# Patient Record
Sex: Female | Born: 1960 | Race: White | Hispanic: No | Marital: Single | Smoking: Current every day smoker
Health system: Southern US, Community
[De-identification: ages and names within clinical notes are randomized; demographics above are authoritative.]

## PROBLEM LIST (undated history)

## (undated) DIAGNOSIS — F410 Panic disorder [episodic paroxysmal anxiety] without agoraphobia: Secondary | ICD-10-CM

## (undated) DIAGNOSIS — F319 Bipolar disorder, unspecified: Secondary | ICD-10-CM

## (undated) DIAGNOSIS — G8929 Other chronic pain: Secondary | ICD-10-CM

## (undated) DIAGNOSIS — F329 Major depressive disorder, single episode, unspecified: Secondary | ICD-10-CM

## (undated) DIAGNOSIS — R569 Unspecified convulsions: Secondary | ICD-10-CM

## (undated) DIAGNOSIS — M5430 Sciatica, unspecified side: Secondary | ICD-10-CM

## (undated) DIAGNOSIS — M549 Dorsalgia, unspecified: Secondary | ICD-10-CM

## (undated) DIAGNOSIS — M1990 Unspecified osteoarthritis, unspecified site: Secondary | ICD-10-CM

## (undated) DIAGNOSIS — F32A Depression, unspecified: Secondary | ICD-10-CM

## (undated) HISTORY — PX: ABDOMINAL SURGERY: SHX537

## (undated) HISTORY — PX: EYE SURGERY: SHX253

---

## 2004-03-27 ENCOUNTER — Inpatient Hospital Stay (HOSPITAL_COMMUNITY): Admission: RE | Admit: 2004-03-27 | Discharge: 2004-04-01 | Payer: Self-pay | Admitting: Psychiatry

## 2007-03-13 ENCOUNTER — Emergency Department: Payer: Self-pay | Admitting: Emergency Medicine

## 2007-07-13 ENCOUNTER — Emergency Department: Payer: Self-pay | Admitting: Emergency Medicine

## 2008-07-31 ENCOUNTER — Emergency Department: Payer: Self-pay | Admitting: Unknown Physician Specialty

## 2008-10-21 ENCOUNTER — Emergency Department: Payer: Self-pay | Admitting: Emergency Medicine

## 2009-02-04 ENCOUNTER — Emergency Department: Payer: Self-pay | Admitting: Emergency Medicine

## 2009-03-27 ENCOUNTER — Emergency Department: Payer: Self-pay | Admitting: Internal Medicine

## 2009-05-29 ENCOUNTER — Emergency Department: Payer: Self-pay | Admitting: Emergency Medicine

## 2009-11-20 ENCOUNTER — Emergency Department: Payer: Self-pay | Admitting: Internal Medicine

## 2010-02-12 ENCOUNTER — Emergency Department: Payer: Self-pay | Admitting: Emergency Medicine

## 2010-06-27 ENCOUNTER — Emergency Department: Payer: Self-pay | Admitting: Emergency Medicine

## 2010-06-30 ENCOUNTER — Emergency Department: Payer: Self-pay | Admitting: Emergency Medicine

## 2011-02-13 ENCOUNTER — Emergency Department: Payer: Self-pay | Admitting: Emergency Medicine

## 2011-06-12 ENCOUNTER — Emergency Department: Payer: Self-pay | Admitting: Emergency Medicine

## 2012-04-20 ENCOUNTER — Emergency Department: Payer: Self-pay

## 2012-06-14 ENCOUNTER — Emergency Department: Payer: Self-pay | Admitting: Emergency Medicine

## 2012-09-14 ENCOUNTER — Emergency Department: Payer: Self-pay | Admitting: Emergency Medicine

## 2012-09-17 ENCOUNTER — Emergency Department: Payer: Self-pay | Admitting: Emergency Medicine

## 2012-11-04 ENCOUNTER — Emergency Department: Payer: Self-pay | Admitting: Emergency Medicine

## 2013-04-04 ENCOUNTER — Emergency Department: Payer: Self-pay | Admitting: Unknown Physician Specialty

## 2013-08-28 ENCOUNTER — Emergency Department: Payer: Self-pay | Admitting: Emergency Medicine

## 2013-09-04 ENCOUNTER — Inpatient Hospital Stay: Payer: Self-pay | Admitting: Psychiatry

## 2013-09-04 LAB — CBC
HCT: 39.8 % (ref 35.0–47.0)
HGB: 13.3 g/dL (ref 12.0–16.0)
MCH: 31.1 pg (ref 26.0–34.0)
MCHC: 33.4 g/dL (ref 32.0–36.0)
MCV: 93 fL (ref 80–100)
PLATELETS: 393 10*3/uL (ref 150–440)
RBC: 4.28 10*6/uL (ref 3.80–5.20)
RDW: 13.8 % (ref 11.5–14.5)
WBC: 6.2 10*3/uL (ref 3.6–11.0)

## 2013-09-04 LAB — DRUG SCREEN, URINE
AMPHETAMINES, UR SCREEN: NEGATIVE (ref ?–1000)
Barbiturates, Ur Screen: NEGATIVE (ref ?–200)
Benzodiazepine, Ur Scrn: POSITIVE (ref ?–200)
Cannabinoid 50 Ng, Ur ~~LOC~~: POSITIVE (ref ?–50)
Cocaine Metabolite,Ur ~~LOC~~: POSITIVE (ref ?–300)
MDMA (Ecstasy)Ur Screen: NEGATIVE (ref ?–500)
Methadone, Ur Screen: POSITIVE (ref ?–300)
Opiate, Ur Screen: NEGATIVE (ref ?–300)
PHENCYCLIDINE (PCP) UR S: NEGATIVE (ref ?–25)
TRICYCLIC, UR SCREEN: POSITIVE (ref ?–1000)

## 2013-09-04 LAB — COMPREHENSIVE METABOLIC PANEL
ALK PHOS: 86 U/L
ANION GAP: 0 — AB (ref 7–16)
Albumin: 3.9 g/dL (ref 3.4–5.0)
BILIRUBIN TOTAL: 0.2 mg/dL (ref 0.2–1.0)
BUN: 11 mg/dL (ref 7–18)
CALCIUM: 8.7 mg/dL (ref 8.5–10.1)
CREATININE: 1.41 mg/dL — AB (ref 0.60–1.30)
Chloride: 101 mmol/L (ref 98–107)
Co2: 33 mmol/L — ABNORMAL HIGH (ref 21–32)
EGFR (Non-African Amer.): 43 — ABNORMAL LOW
GFR CALC AF AMER: 50 — AB
GLUCOSE: 76 mg/dL (ref 65–99)
OSMOLALITY: 266 (ref 275–301)
Potassium: 3.7 mmol/L (ref 3.5–5.1)
SGOT(AST): 22 U/L (ref 15–37)
SGPT (ALT): 13 U/L (ref 12–78)
SODIUM: 134 mmol/L — AB (ref 136–145)
Total Protein: 7.7 g/dL (ref 6.4–8.2)

## 2013-09-04 LAB — ACETAMINOPHEN LEVEL: Acetaminophen: <2 ug/mL

## 2013-09-04 LAB — ETHANOL
Ethanol %: <0.003 % (ref 0.000–0.080)
Ethanol: <3 mg/dL

## 2013-09-04 LAB — URINALYSIS, COMPLETE
BLOOD: NEGATIVE
Bacteria: NONE SEEN
Bilirubin,UR: NEGATIVE
Glucose,UR: NEGATIVE mg/dL (ref 0–75)
Ketone: NEGATIVE
Nitrite: NEGATIVE
PH: 6 (ref 4.5–8.0)
Protein: NEGATIVE
RBC,UR: NONE SEEN /HPF (ref 0–5)
SPECIFIC GRAVITY: 1.005 (ref 1.003–1.030)

## 2013-09-04 LAB — SALICYLATE LEVEL: Salicylates, Serum: 4.2 mg/dL — ABNORMAL HIGH

## 2014-02-28 ENCOUNTER — Emergency Department: Payer: Self-pay | Admitting: Emergency Medicine

## 2014-02-28 LAB — URINALYSIS, COMPLETE
BACTERIA: NONE SEEN
Bilirubin,UR: NEGATIVE
GLUCOSE, UR: NEGATIVE mg/dL (ref 0–75)
KETONE: NEGATIVE
Leukocyte Esterase: NEGATIVE
Nitrite: NEGATIVE
PROTEIN: NEGATIVE
Ph: 6 (ref 4.5–8.0)
RBC,UR: 2 /HPF (ref 0–5)
Specific Gravity: 1.02 (ref 1.003–1.030)
Squamous Epithelial: <1

## 2014-02-28 LAB — COMPREHENSIVE METABOLIC PANEL
ALBUMIN: 4.2 g/dL (ref 3.4–5.0)
ALK PHOS: 90 U/L
ALT: 11 U/L — AB (ref 12–78)
Anion Gap: 5 — ABNORMAL LOW (ref 7–16)
BUN: 7 mg/dL (ref 7–18)
Bilirubin,Total: 0.2 mg/dL (ref 0.2–1.0)
CREATININE: 0.99 mg/dL (ref 0.60–1.30)
Calcium, Total: 9.7 mg/dL (ref 8.5–10.1)
Chloride: 106 mmol/L (ref 98–107)
Co2: 27 mmol/L (ref 21–32)
EGFR (African American): >60
EGFR (Non-African Amer.): >60
Glucose: 85 mg/dL (ref 65–99)
Osmolality: 273 (ref 275–301)
Potassium: 3.7 mmol/L (ref 3.5–5.1)
SGOT(AST): 20 U/L (ref 15–37)
Sodium: 138 mmol/L (ref 136–145)
Total Protein: 8.3 g/dL — ABNORMAL HIGH (ref 6.4–8.2)

## 2014-02-28 LAB — DRUG SCREEN, URINE

## 2014-02-28 LAB — CBC
HCT: 46.2 % (ref 35.0–47.0)
HGB: 15.3 g/dL (ref 12.0–16.0)
MCH: 30.2 pg (ref 26.0–34.0)
MCHC: 33.1 g/dL (ref 32.0–36.0)
MCV: 91 fL (ref 80–100)
PLATELETS: 405 10*3/uL (ref 150–440)
RBC: 5.07 10*6/uL (ref 3.80–5.20)
RDW: 14.3 % (ref 11.5–14.5)
WBC: 8.6 10*3/uL (ref 3.6–11.0)

## 2014-02-28 LAB — ETHANOL: Ethanol %: <0.003 % (ref 0.000–0.080)

## 2014-02-28 LAB — ACETAMINOPHEN LEVEL: Acetaminophen: <2 ug/mL

## 2014-02-28 LAB — SALICYLATE LEVEL: Salicylates, Serum: 6.4 mg/dL — ABNORMAL HIGH

## 2014-03-12 ENCOUNTER — Emergency Department: Payer: Self-pay | Admitting: Emergency Medicine

## 2014-03-12 LAB — COMPREHENSIVE METABOLIC PANEL
ALK PHOS: 84 U/L
ANION GAP: 7 (ref 7–16)
Albumin: 4 g/dL (ref 3.4–5.0)
BILIRUBIN TOTAL: 0.3 mg/dL (ref 0.2–1.0)
BUN: 9 mg/dL (ref 7–18)
CREATININE: 1.23 mg/dL (ref 0.60–1.30)
Calcium, Total: 8.9 mg/dL (ref 8.5–10.1)
Chloride: 104 mmol/L (ref 98–107)
Co2: 26 mmol/L (ref 21–32)
EGFR (African American): 58 — ABNORMAL LOW
EGFR (Non-African Amer.): 50 — ABNORMAL LOW
GLUCOSE: 69 mg/dL (ref 65–99)
Osmolality: 271 (ref 275–301)
POTASSIUM: 3.8 mmol/L (ref 3.5–5.1)
SGOT(AST): 27 U/L (ref 15–37)
SGPT (ALT): 13 U/L (ref 12–78)
Sodium: 137 mmol/L (ref 136–145)
Total Protein: 7.6 g/dL (ref 6.4–8.2)

## 2014-03-12 LAB — SALICYLATE LEVEL: Salicylates, Serum: 3.8 mg/dL — ABNORMAL HIGH

## 2014-03-12 LAB — URINALYSIS, COMPLETE
BLOOD: NEGATIVE
Bilirubin,UR: NEGATIVE
Glucose,UR: NEGATIVE mg/dL (ref 0–75)
Ketone: NEGATIVE
Leukocyte Esterase: NEGATIVE
Nitrite: NEGATIVE
PH: 6 (ref 4.5–8.0)
Protein: NEGATIVE
RBC,UR: 2 /HPF (ref 0–5)
Specific Gravity: 1.013 (ref 1.003–1.030)
Squamous Epithelial: NONE SEEN
WBC UR: 1 /HPF (ref 0–5)

## 2014-03-12 LAB — CBC
HCT: 42.6 % (ref 35.0–47.0)
HGB: 14.2 g/dL (ref 12.0–16.0)
MCH: 30.2 pg (ref 26.0–34.0)
MCHC: 33.2 g/dL (ref 32.0–36.0)
MCV: 91 fL (ref 80–100)
PLATELETS: 337 10*3/uL (ref 150–440)
RBC: 4.69 10*6/uL (ref 3.80–5.20)
RDW: 14.3 % (ref 11.5–14.5)
WBC: 13.1 10*3/uL — ABNORMAL HIGH (ref 3.6–11.0)

## 2014-03-12 LAB — DRUG SCREEN, URINE
AMPHETAMINES, UR SCREEN: NEGATIVE (ref ?–1000)
BENZODIAZEPINE, UR SCRN: POSITIVE (ref ?–200)
Barbiturates, Ur Screen: NEGATIVE (ref ?–200)
Cannabinoid 50 Ng, Ur ~~LOC~~: POSITIVE (ref ?–50)
Cocaine Metabolite,Ur ~~LOC~~: POSITIVE (ref ?–300)
MDMA (Ecstasy)Ur Screen: NEGATIVE (ref ?–500)
Methadone, Ur Screen: NEGATIVE (ref ?–300)
Opiate, Ur Screen: POSITIVE (ref ?–300)
Phencyclidine (PCP) Ur S: NEGATIVE (ref ?–25)
Tricyclic, Ur Screen: NEGATIVE (ref ?–1000)

## 2014-03-12 LAB — ETHANOL

## 2014-03-12 LAB — ACETAMINOPHEN LEVEL: Acetaminophen: <2 ug/mL

## 2014-07-19 ENCOUNTER — Emergency Department: Payer: Self-pay | Admitting: Emergency Medicine

## 2014-09-17 ENCOUNTER — Emergency Department: Payer: Self-pay | Admitting: Emergency Medicine

## 2014-10-19 ENCOUNTER — Emergency Department: Payer: Self-pay | Admitting: Emergency Medicine

## 2014-10-20 ENCOUNTER — Emergency Department: Payer: Self-pay | Admitting: Emergency Medicine

## 2014-11-15 ENCOUNTER — Ambulatory Visit: Payer: Self-pay

## 2014-11-17 ENCOUNTER — Ambulatory Visit: Payer: Self-pay

## 2014-11-20 ENCOUNTER — Emergency Department: Payer: Self-pay | Admitting: Emergency Medicine

## 2014-11-21 ENCOUNTER — Emergency Department: Payer: Self-pay | Admitting: Emergency Medicine

## 2014-12-08 ENCOUNTER — Other Ambulatory Visit: Payer: Self-pay | Admitting: Oncology

## 2014-12-08 DIAGNOSIS — N6320 Unspecified lump in the left breast, unspecified quadrant: Secondary | ICD-10-CM

## 2014-12-16 NOTE — H&P (Signed)
PATIENT NAME:  Nicole Chandler, Nicole Chandler MR#:  [id number] DATE OF BIRTH:  [birth year]  DATE OF ADMISSION:  09/04/2013  DATE OF CONSULTATION: 09/05/2013   IDENTIFYING INFORMATION AND CHIEF COMPLAINT: A 54 year old woman who came to the hospital voluntarily with the chief complaint, "I need to get off these pills."   HISTORY OF PRESENT ILLNESS: Information obtained from the patient and the chart. The patient came to the Emergency Room saying that she needs to stop abusing pills. She tells me that she has been using pills "all my life."   Recently, on about Christmas Day, her long-term boyfriend died. Since that time, her life has been shattered. She no longer has a place to live and has lost most of her property as she was very dependent on him. She is using methadone, by her report about 20 mg a day, and also says she uses Percocet regularly. Also had used some Xanax just prior to admission. Denies that she drinks frequently but admits that she uses cocaine and has been using more frequently the last few days.   Mood has been down and depressed. She has been feeling sad and tearful. Poor sleep. Poor appetite. She had passive suicidal thoughts without any active intent.   PAST PSYCHIATRIC HISTORY: The patient has minimal past history with treatment. Says that she has never been in any kind of serious substance abuse rehab before. Denies any past history of suicide attempts. Denies any history of psychiatric medication.   SOCIAL HISTORY: Not working and has not worked in years. She was dependent on her long-term boyfriend who passed away just a couple of weeks ago. Currently she is staying with her sisters. Her sisters are her closest relatives. She does have adult children and stays in occasional contact with them.   PAST MEDICAL HISTORY: Reports that she has a heart murmur and also has what she calls "seizures." When she describes them, they do not sound like seizures but rather perhaps like dissociative  episodes.   CURRENT MEDICATIONS: Flexeril 10 mg p.r.n.   ALLERGIES: No known drug allergies.   FAMILY HISTORY: She denies any family history of mental health problems.   REVIEW OF SYSTEMS: Feeling sad, tearful, depressed. No active suicidal intent. No hallucinations or delusions. She is not having a large amount of pain. She does feel sick to her stomach and has been having diarrhea and is having rhinorrhea symptoms.   MENTAL STATUS: Disheveled woman, looks older than her stated age. Cooperative with the interview. Poor eye contact. Psychomotor activity slow and sluggish. Speech decreased in tone and amount. Affect blunted and dysphoric. Mood stated as depressed. Thoughts slow but lucid. No sign of delusional thinking. Denies auditory or visual hallucinations. Denies suicidal or homicidal ideation currently. Judgment and insight improved. Intelligence average. Alert and oriented x4.   PHYSICAL EXAMINATION:  GENERAL: The patient had a runny nose, otherwise appeared to be in no specific distress.  HEENT: Pupils equal and reactive. Face symmetric. Oral mucosa dry.  NECK AND BACK: Nontender to light palpation.  EXTREMITIES: Full range of motion at all extremities. Strength and reflexes symmetric throughout.  NEUROLOGIC: Cranial nerves symmetric and normal.  LUNGS: Clear without wheezes,  HEART: Regular rate and rhythm.  ABDOMEN: Soft, nontender, normal bowel sounds.  VITAL SIGNS: Temperature 98 degrees, pulse 67, respirations 18, blood pressure 108/72.   LABORATORY RESULTS: Admission labs included alcohol level that was negative. Chemistry panel with elevated creatinine at 1.4, sodium 134. Drug screen positive for tricyclics, cocaine, cannabis, benzodiazepines,  and methadone. CBC normal. Urinalysis: Noninfected.   ASSESSMENT: This is a 54 year old woman who is reporting prescription opiate dependence. Not getting it prescribed. Recent death of her boyfriend has left her with major life problems  and worsening mood. Limited social support. Wants to get off of her medication that she has been abusing.   TREATMENT PLAN: I propose that we give a 2-to-3-day taper on Suboxone to mitigate opiate withdrawal symptoms. Try to engage her in group therapy and individual therapy. I have started encouraging her to consider going to the alcohol and drug abuse treatment center. Continue current medicine otherwise for now. The admitting orders had started her on citalopram which we can continue for now. She will also be given other medicines for opiate withdrawal symptoms.   DIAGNOSIS, PRINCIPAL AND PRIMARY:  AXIS I: Substance-induced depression.   SECONDARY DIAGNOSES:  AXIS I:  1.  Opiate dependence.       2.  Cocaine dependence.      3.  Benzodiazepine abuse.  AXIS II: Deferred.  AXIS III: Deferred.  AXIS IV: Severe from recent death of her boyfriend.  AXIS V: Functioning at time of evaluation is 35.   ____________________________ Audery Amel, MD jtc:np D: 09/05/2013 21:52:02 ET T: 09/05/2013 22:05:12 ET JOB#: [id number]  cc: Audery Amel, MD, <Dictator> Audery Amel MD ELECTRONICALLY SIGNED 09/07/2013 0:34

## 2014-12-16 NOTE — Discharge Summary (Signed)
PATIENT NAME:  Nicole Chandler, Nicole Chandler MR#:  [id number] DATE OF BIRTH:  [birth year]  DATE OF ADMISSION:  09/04/2013 DATE OF DISCHARGE:  09/08/2013  HOSPITAL COURSE: See dictated history and physical for details of admission. This 54 year old woman was brought into the hospital with complaints of depression and a desire to quit using opiates. She had recently suffered the death of her long-term partner and dislocation from her place of living. She was abusing opiates and other drugs. She was anxious and dysphoric. She had no prior experience with substance abuse treatment. In the hospital, she was treated with medical detox as well as supportive and educational counseling and therapy. She received a little over 2 days of Suboxone as detox from the opiates that she had been abusing and also received multiple supportive medications. She has tolerated the withdrawal well with minimal complaints. Mood has remained depressed and dysphoric, but she has been attending groups and working in individual counseling and appears to be motivated and optimistic. She has been taking citalopram 40 mg a day and tolerating it well. She complained of difficulty sleeping for much of her hospital stay, but has now been placed on Seroquel 100 mg at night to help with that. Because of her lack of social support and lack of familiarity with substance abuse treatment it is advised that she go to the alcohol and drug abuse treatment center and she has been agreeable to that. She has been accepted to [facility] and will be transferred there today. The patient is not expressing suicidality and appears to be lucid and capable of interacting in treatment.   DISCHARGE MEDICATIONS: No new prescriptions given, but current medications include citalopram 40 mg a day, Imodium 2 mg 4 times a day as needed for diarrhea, quetiapine 100 mg at night, ondansetron 4 mg every 6 hours as needed for nausea, methocarbamol 1500 mg every 8 hours as needed for pain.    LABORATORY RESULTS: Admission labs included a drug screen positive for tricyclics, cocaine, cannabis, benzodiazepines and methadone. Urinalysis borderline but without symptoms. CBC normal. Sodium slightly low at 134, creatinine elevated at 1.4, otherwise normal chemistries. Alcohol undetected.   MENTAL STATUS EXAMINATION AT DISCHARGE: Neatly groomed and dressed woman, looks her stated age, cooperative with the interview. Eye contact is still a little bit decreased and her psychomotor activity is a little slow. Speech is quiet but appropriate and understandable. Affect is starting to show a little bit more smiling but is still blunted. Mood is stated as being looking forward to going to [facility]. Thoughts are generally lucid. No sign of loosening of associations or delusions. Denies hallucinations. Denies suicidal or homicidal ideation. Shows improved judgment and insight. Normal intelligence. Alert and oriented x4.   DISPOSITION: Discharged today under IVC to the alcohol and drug abuse treatment center.   DIAGNOSIS, PRINCIPAL AND PRIMARY:  AXIS I: Opiate dependence.   SECONDARY DIAGNOSES: AXIS I:  1.  Cocaine dependence versus abuse. 2.  Cannabis abuse. 3.  Benzodiazepine abuse.  4.  Major depression, moderate, single.  AXIS II: Deferred.  AXIS III: Opiate withdrawal.  AXIS IV: Severe from recent death of her long term companion.  AXIS V: Functioning at time of discharge 50.  ____________________________ Audery AmelJohn T. Mesiah Manzo, MD jtc:sb D: 09/08/2013 10:35:46 ET T: 09/08/2013 10:59:43 ET JOB#: [id number]  cc: Audery AmelJohn T. Nicle Connole, MD, <Dictator> Audery AmelJOHN T Raaga Maeder MD ELECTRONICALLY SIGNED 09/08/2013 15:11

## 2014-12-20 ENCOUNTER — Emergency Department
Admit: 2014-12-20 | Disposition: A | Discharge status: Home / Self Care | Payer: Self-pay | Admitting: Emergency Medicine

## 2014-12-20 LAB — COMPREHENSIVE METABOLIC PANEL
ALBUMIN: 4.3 g/dL
Alkaline Phosphatase: 82 U/L
Anion Gap: 8 (ref 7–16)
BUN: 11 mg/dL
Bilirubin,Total: 0.1 mg/dL — ABNORMAL LOW
CREATININE: 0.91 mg/dL
Calcium, Total: 9.6 mg/dL
Chloride: 101 mmol/L
Co2: 30 mmol/L
GLUCOSE: 94 mg/dL
Potassium: 3.9 mmol/L
SGOT(AST): 39 U/L
SGPT (ALT): 23 U/L
SODIUM: 139 mmol/L
TOTAL PROTEIN: 7.6 g/dL

## 2014-12-20 LAB — URINALYSIS, COMPLETE
BILIRUBIN, UR: NEGATIVE
Glucose,UR: NEGATIVE mg/dL (ref 0–75)
Ketone: NEGATIVE
LEUKOCYTE ESTERASE: NEGATIVE
Nitrite: NEGATIVE
PH: 7 (ref 4.5–8.0)
Protein: NEGATIVE
Specific Gravity: 1.002 (ref 1.003–1.030)

## 2014-12-20 LAB — CBC WITH DIFFERENTIAL/PLATELET
Basophil #: 0 10*3/uL (ref 0.0–0.1)
Basophil %: 0.4 %
EOS ABS: 0.4 10*3/uL (ref 0.0–0.7)
EOS PCT: 4.8 %
HCT: 41 % (ref 35.0–47.0)
HGB: 13.7 g/dL (ref 12.0–16.0)
Lymphocyte #: 3.6 10*3/uL (ref 1.0–3.6)
Lymphocyte %: 43.5 %
MCH: 31 pg (ref 26.0–34.0)
MCHC: 33.4 g/dL (ref 32.0–36.0)
MCV: 93 fL (ref 80–100)
MONO ABS: 0.6 x10 3/mm (ref 0.2–0.9)
Monocyte %: 7.6 %
NEUTROS PCT: 43.7 %
Neutrophil #: 3.6 10*3/uL (ref 1.4–6.5)
Platelet: 323 10*3/uL (ref 150–440)
RBC: 4.42 10*6/uL (ref 3.80–5.20)
RDW: 14.4 % (ref 11.5–14.5)
WBC: 8.3 10*3/uL (ref 3.6–11.0)

## 2014-12-20 LAB — LIPASE, BLOOD: LIPASE: 53 U/L — AB

## 2014-12-25 ENCOUNTER — Other Ambulatory Visit: Payer: Self-pay

## 2014-12-25 DIAGNOSIS — Z87891 Personal history of nicotine dependence: Secondary | ICD-10-CM | POA: Insufficient documentation

## 2014-12-25 DIAGNOSIS — M940 Chondrocostal junction syndrome [Tietze]: Secondary | ICD-10-CM | POA: Insufficient documentation

## 2014-12-25 DIAGNOSIS — J208 Acute bronchitis due to other specified organisms: Secondary | ICD-10-CM | POA: Insufficient documentation

## 2014-12-25 LAB — CBC
HCT: 40.1 % (ref 35.0–47.0)
Hemoglobin: 13 g/dL (ref 12.0–16.0)
MCH: 30.5 pg (ref 26.0–34.0)
MCHC: 32.5 g/dL (ref 32.0–36.0)
MCV: 93.9 fL (ref 80.0–100.0)
PLATELETS: 382 10*3/uL (ref 150–440)
RBC: 4.27 MIL/uL (ref 3.80–5.20)
RDW: 14.9 % — ABNORMAL HIGH (ref 11.5–14.5)
WBC: 13.1 10*3/uL — AB (ref 3.6–11.0)

## 2014-12-25 LAB — BASIC METABOLIC PANEL
ANION GAP: 9 (ref 5–15)
BUN: 9 mg/dL (ref 6–20)
CALCIUM: 9.4 mg/dL (ref 8.9–10.3)
CO2: 28 mmol/L (ref 22–32)
Chloride: 104 mmol/L (ref 101–111)
Creatinine, Ser: 1.06 mg/dL — ABNORMAL HIGH (ref 0.44–1.00)
GFR calc Af Amer: >60 mL/min (ref >60)
GFR calc non Af Amer: 59 mL/min — ABNORMAL LOW (ref >60)
Glucose, Bld: 141 mg/dL — ABNORMAL HIGH (ref 65–99)
POTASSIUM: 3.6 mmol/L (ref 3.5–5.1)
SODIUM: 141 mmol/L (ref 135–145)

## 2014-12-25 LAB — TROPONIN I: Troponin I: 0 ng/mL (ref <0.031)

## 2014-12-25 NOTE — ED Notes (Signed)
Pt states she was seen in this ed last Friday for same. Pt states she now has chest pain from coughing so much. Sob stress incontinence. Pt states she has been taking meds but not getting better

## 2014-12-26 ENCOUNTER — Emergency Department
Admission: EM | Admit: 2014-12-26 | Discharge: 2014-12-26 | Disposition: A | Discharge status: Home / Self Care | Payer: Self-pay | Attending: Emergency Medicine | Admitting: Emergency Medicine

## 2014-12-26 ENCOUNTER — Encounter: Payer: Self-pay | Admitting: Emergency Medicine

## 2014-12-26 DIAGNOSIS — M940 Chondrocostal junction syndrome [Tietze]: Secondary | ICD-10-CM

## 2014-12-26 DIAGNOSIS — J208 Acute bronchitis due to other specified organisms: Secondary | ICD-10-CM

## 2014-12-26 MED ORDER — KETOROLAC TROMETHAMINE 30 MG/ML IJ SOLN
60.0000 mg | Freq: Once | INTRAMUSCULAR | Status: AC
  Administered 2014-12-26: 60 mg via INTRAMUSCULAR

## 2014-12-26 MED ORDER — OXYCODONE-ACETAMINOPHEN 5-325 MG PO TABS: 1.0000 | ORAL_TABLET | ORAL | Status: DC | PRN

## 2014-12-26 MED ORDER — HYDROCOD POLST-CPM POLST ER 10-8 MG/5ML PO SUER
ORAL | Status: AC
  Administered 2014-12-26: 5 mL via ORAL
  Filled 2014-12-26: qty 5

## 2014-12-26 MED ORDER — KETOROLAC TROMETHAMINE 30 MG/ML IJ SOLN
INTRAMUSCULAR | Status: AC
  Filled 2014-12-26: qty 1

## 2014-12-26 MED ORDER — IBUPROFEN 200 MG PO TABS
800.0000 mg | ORAL_TABLET | Freq: Three times a day (TID) | ORAL | Status: DC | PRN
Start: 2014-12-26 — End: 2015-02-14

## 2014-12-26 MED ORDER — HYDROCOD POLST-CPM POLST ER 10-8 MG/5ML PO SUER: 5.0000 mL | Freq: Once | ORAL | Status: DC

## 2014-12-26 MED ORDER — OXYCODONE-ACETAMINOPHEN 5-325 MG PO TABS
ORAL_TABLET | ORAL | Status: AC
  Administered 2014-12-26: 1 via ORAL
  Filled 2014-12-26: qty 1

## 2014-12-26 MED ORDER — HYDROCOD POLST-CPM POLST ER 10-8 MG/5ML PO SUER
5.0000 mL | Freq: Once | ORAL | Status: AC
  Administered 2014-12-26: 5 mL via ORAL

## 2014-12-26 MED ORDER — KETOROLAC TROMETHAMINE 60 MG/2ML IM SOLN
INTRAMUSCULAR | Status: AC
  Filled 2014-12-26: qty 2

## 2014-12-26 MED ORDER — OXYCODONE-ACETAMINOPHEN 5-325 MG PO TABS
1.0000 | ORAL_TABLET | Freq: Once | ORAL | Status: AC
  Administered 2014-12-26: 1 via ORAL

## 2014-12-26 NOTE — Discharge Instructions (Signed)
1. Change cough medicine to Tussionex; use as needed for cough. 2. Continue steroid and albuterol inhaler as previously directed. 3. Take pain medicines as needed (Motrin/Percocet #15). 4. Apply moist heat to affected area several times daily. 5. Return to the ER for worsening symptoms, fever, difficulty breathing or other concerns.  Chest Wall Pain Chest wall pain is pain in or around the bones and muscles of your chest. It may take up to 6 weeks to get better. It may take longer if you must stay physically active in your work and activities.  CAUSES  Chest wall pain may happen on its own. However, it may be caused by:  A viral illness like the flu.  Injury.  Coughing.  Exercise.  Arthritis.  Fibromyalgia.  Shingles. HOME CARE INSTRUCTIONS   Avoid overtiring physical activity. Try not to strain or perform activities that cause pain. This includes any activities using your chest or your abdominal and side muscles, especially if heavy weights are used.  Put ice on the sore area.  Put ice in a plastic bag.  Place a towel between your skin and the bag.  Leave the ice on for 15-20 minutes per hour while awake for the first 2 days.  Only take over-the-counter or prescription medicines for pain, discomfort, or fever as directed by your caregiver. SEEK IMMEDIATE MEDICAL CARE IF:   Your pain increases, or you are very uncomfortable.  You have a fever.  Your chest pain becomes worse.  You have new, unexplained symptoms.  You have nausea or vomiting.  You feel sweaty or lightheaded.  You have a cough with phlegm (sputum), or you cough up blood. MAKE SURE YOU:   Understand these instructions.  Will watch your condition.  Will get help right away if you are not doing well or get worse. Document Released: 08/11/2005 Document Revised: 11/03/2011 Document Reviewed: 04/07/2011 [facility] Patient Information 2015 [employer] [place]. This information is not intended to replace  advice given to you by your health care provider. Make sure you discuss any questions you have with your health care provider.  Acute Bronchitis Bronchitis is when the airways that extend from the windpipe into the lungs get red, puffy, and painful (inflamed). Bronchitis often causes thick spit (mucus) to develop. This leads to a cough. A cough is the most common symptom of bronchitis. In acute bronchitis, the condition usually begins suddenly and goes away over time (usually in 2 weeks). Smoking, allergies, and asthma can make bronchitis worse. Repeated episodes of bronchitis may cause more lung problems. HOME CARE  Rest.  Drink enough fluids to keep your pee (urine) clear or pale yellow (unless you need to limit fluids as told by your doctor).  Only take over-the-counter or prescription medicines as told by your doctor.  Avoid smoking and secondhand smoke. These can make bronchitis worse. If you are a smoker, think about using nicotine gum or skin patches. Quitting smoking will help your lungs heal faster.  Reduce the chance of getting bronchitis again by:  Washing your hands often.  Avoiding people with cold symptoms.  Trying not to touch your hands to your mouth, nose, or eyes.  Follow up with your doctor as told. GET HELP IF: Your symptoms do not improve after 1 week of treatment. Symptoms include:  Cough.  Fever.  Coughing up thick spit.  Body aches.  Chest congestion.  Chills.  Shortness of breath.  Sore throat. GET HELP RIGHT AWAY IF:   You have an increased fever.  You have chills.  You have severe shortness of breath.  You have bloody thick spit (sputum).  You throw up (vomit) often.  You lose too much body fluid (dehydration).  You have a severe headache.  You faint. MAKE SURE YOU:   Understand these instructions.  Will watch your condition.  Will get help right away if you are not doing well or get worse. Document Released: 01/28/2008  Document Revised: 04/13/2013 Document Reviewed: 02/01/2013 [facility] Patient Information 2015 [place], [place]. This information is not intended to replace advice given to you by your health care provider. Make sure you discuss any questions you have with your health care provider.

## 2014-12-26 NOTE — ED Notes (Signed)
Pt resting on bed awaiting discharge instructions no distress noted at this time.

## 2014-12-26 NOTE — ED Provider Notes (Signed)
[facility] Emergency Department Provider Note    ____________________________________________  Time seen: 0110  I have reviewed the triage vital signs and the nursing notes.   HISTORY  Chief Complaint Pleurisy; Shortness of Breath; and Sore Throat       HPI Nicole Chandler is a 54 y.o. female who presents with pleuritic chest pain worse with cough. Patient was seen in the ED 1 week ago and diagnosed with bronchitis. Patient was prescribed steroids which she has 1 dose left, albuterol MDI, Tessalon Perles for cough. Patient complains of cough productive of yellow sputum and 10/10 pain across her entire chest on coughing. Patient denies fever, chills, shortness of breath, vomiting or diarrhea.Patient denies recent travel or surgical history; patient denies use of hormones.    History reviewed. No pertinent past medical history.  There are no active problems to display for this patient.   History reviewed. No pertinent past surgical history.  Current Outpatient Rx  Name  Route  Sig  Dispense  Refill  . chlorpheniramine-HYDROcodone (TUSSIONEX) 10-8 MG/5ML SUER   Oral   Take 5 mLs by mouth once.   70 mL   0   . ibuprofen (MOTRIN IB) 200 MG tablet   Oral   Take 4 tablets (800 mg total) by mouth every 8 (eight) hours as needed for moderate pain. Take one tablet by mouth every 8 hours as needed for moderate pain.   15 tablet   0   . oxyCODONE-acetaminophen (PERCOCET/ROXICET) 5-325 MG per tablet   Oral   Take 1 tablet by mouth every 4 (four) hours as needed for severe pain.   15 tablet   0     Allergies Review of patient's allergies indicates no known allergies.  Family History  Problem Relation Age of Onset  . Alzheimer's disease Mother   . AAA (abdominal aortic aneurysm) Father     Social History History  Substance Use Topics  . Smoking status: Former Smoker    Quit date: 08/27/2014  . Smokeless tobacco: Not on file  . Alcohol Use:  No    Review of Systems  Constitutional: Negative for fever. Eyes: Negative for visual changes. ENT: Positive for sore throat secondary to cough. Cardiovascular: Positive for chest pain. Respiratory: Negative for shortness of breath. Gastrointestinal: Negative for abdominal pain, vomiting and diarrhea. Genitourinary: Negative for dysuria. Musculoskeletal: Negative for back pain. Skin: Negative for rash. Neurological: Negative for headaches, focal weakness or numbness.   10-point ROS otherwise negative.  ____________________________________________   PHYSICAL EXAM:  VITAL SIGNS: ED Triage Vitals  Enc Vitals Group     BP 12/25/14 2125 159/117 mmHg     Pulse Rate 12/25/14 2125 64     Resp 12/25/14 2125 20     Temp 12/25/14 2125 98.3 F (36.8 C)     Temp Source 12/25/14 2125 Oral     SpO2 12/25/14 2125 99 %     Weight 12/25/14 2125 145 lb (65.772 kg)     Height 12/25/14 2125  (1.575 m)     Head Cir --      Peak Flow --      Pain Score 12/26/14 0036 6     Pain Loc --      Pain Edu? --      Excl. in GC? --      Constitutional: Alert and oriented. Tearful in mild distress. Eyes: Conjunctivae are normal. PERRL. Normal extraocular movements. ENT   Head: Normocephalic and atraumatic.  Nose: No congestion/rhinnorhea.   Mouth/Throat: Mucous membranes are moist. Pharyngeal erythema.   Neck: No stridor. Hematological/Lymphatic/Immunilogical: No cervical lymphadenopathy. Cardiovascular: Normal rate, regular rhythm. Normal and symmetric distal pulses are present in all extremities. No murmurs, rubs, or gallops. Respiratory: Normal respiratory effort without tachypnea nor retractions. Breath sounds are clear and equal bilaterally. No wheezes/rales/rhonchi. Anterior chest wall tender to palpation and with movement. Gastrointestinal: Soft and nontender. No distention. No abdominal bruits. There is no CVA tenderness. Genitourinary: Deferred Musculoskeletal:  Nontender with normal range of motion in all extremities. No joint effusions.  No lower extremity tenderness nor edema. Negative Homans sign bilaterally. Neurologic:  Normal speech and language. No gross focal neurologic deficits are appreciated. Speech is normal. No gait instability. Skin:  Skin is warm, dry and intact. No rash noted. Psychiatric: Mood and affect are normal. Speech and behavior are normal. Patient exhibits appropriate insight and judgment.  ____________________________________________    LABS (pertinent positives/negatives)  Notable for mild leukocytosis.  ____________________________________________   EKG  ED ECG REPORT   Date: 12/26/2014  EKG Time: 2136  Rate: 63  Rhythm: normal EKG, normal sinus rhythm  Axis: Normal  Intervals:none  ST&T Change: Nonspecific   ____________________________________________    RADIOLOGY  None  ____________________________________________   PROCEDURES  Procedure(s) performed: None  Critical Care performed: No  ____________________________________________   INITIAL IMPRESSION / ASSESSMENT AND PLAN / ED COURSE  Pertinent labs & imaging results that were available during my care of the patient were reviewed by me and considered in my medical decision making (see chart for details).  54 year old female with costochondritis secondary to viral bronchitis. Patient is afebrile, not hypoxic. Plan for IM analgesia in the ED, change Tessalon Perles to Tussionex, follow-up with PCP.  Patient was feeling much better after analgesics. No acute distress at the time of discharge. Room air saturations 95%. Discussed with patient given strict return precautions. Patient verbalizes understanding and agrees with plan.  ____________________________________________   FINAL CLINICAL IMPRESSION(S) / ED DIAGNOSES  Final diagnoses:  Viral bronchitis  Costochondritis, acute     Irean HongJade J Sung, MD 12/26/14 [phone] [id number]

## 2014-12-26 NOTE — ED Notes (Signed)
Report received from Andrea RN.

## 2015-01-02 ENCOUNTER — Telehealth: Payer: Self-pay

## 2015-01-02 NOTE — Telephone Encounter (Signed)
Mailed notification of follow-up appointment returned .  Phoned patient to confirm address.  She is scheduled for a six month follow-up mammogram, and ultrasound on Friday 05/18/15 at 10:20.  Verbalizes understanding.  Mailed appointment.

## 2015-01-09 ENCOUNTER — Emergency Department
Admission: EM | Admit: 2015-01-09 | Discharge: 2015-01-09 | Disposition: A | Discharge status: Home / Self Care | Payer: Self-pay | Attending: Emergency Medicine | Admitting: Emergency Medicine

## 2015-01-09 ENCOUNTER — Encounter: Payer: Self-pay | Admitting: Emergency Medicine

## 2015-01-09 DIAGNOSIS — Z79899 Other long term (current) drug therapy: Secondary | ICD-10-CM | POA: Insufficient documentation

## 2015-01-09 DIAGNOSIS — Z720 Tobacco use: Secondary | ICD-10-CM | POA: Insufficient documentation

## 2015-01-09 DIAGNOSIS — G8929 Other chronic pain: Secondary | ICD-10-CM | POA: Insufficient documentation

## 2015-01-09 DIAGNOSIS — M5442 Lumbago with sciatica, left side: Secondary | ICD-10-CM | POA: Insufficient documentation

## 2015-01-09 DIAGNOSIS — M5441 Lumbago with sciatica, right side: Secondary | ICD-10-CM | POA: Insufficient documentation

## 2015-01-09 MED ORDER — CYCLOBENZAPRINE HCL 10 MG PO TABS: 10.0000 mg | ORAL_TABLET | Freq: Three times a day (TID) | ORAL | Status: DC | PRN

## 2015-01-09 MED ORDER — OXYCODONE-ACETAMINOPHEN 10-325 MG PO TABS: 1.0000 | ORAL_TABLET | Freq: Four times a day (QID) | ORAL | Status: DC | PRN

## 2015-01-09 NOTE — ED Provider Notes (Signed)
[facility] Emergency Department Provider Note  ____________________________________________  Time seen: Approximately 11:48 AM  I have reviewed the triage vital signs and the nursing notes.   HISTORY  Chief Complaint Back Pain    HPI Nicole Chandler is a 54 y.o. female who presents to the ED with chronic low back pain. States history of pinched nerve. I actually saw this patient approximately the 7 weeks ago here for the same. Reports not having a PCP to follow up on a continuous basis. Reports pain as a 10 out of 10 right now   No past medical history on file.  There are no active problems to display for this patient.   No past surgical history on file.  Current Outpatient Rx  Name  Route  Sig  Dispense  Refill  . chlorpheniramine-HYDROcodone (TUSSIONEX) 10-8 MG/5ML SUER   Oral   Take 5 mLs by mouth once.   70 mL   0   . ibuprofen (MOTRIN IB) 200 MG tablet   Oral   Take 4 tablets (800 mg total) by mouth every 8 (eight) hours as needed for moderate pain. Take one tablet by mouth every 8 hours as needed for moderate pain.   15 tablet   0   . oxyCODONE-acetaminophen (PERCOCET) 10-325 MG per tablet   Oral   Take 1 tablet by mouth every 6 (six) hours as needed for pain.   20 tablet   0     Allergies Review of patient's allergies indicates no known allergies.  Family History  Problem Relation Age of Onset  . Alzheimer's disease Mother   . AAA (abdominal aortic aneurysm) Father     Social History History  Substance Use Topics  . Smoking status: Current Every Day Smoker    Last Attempt to Quit: 08/27/2014  . Smokeless tobacco: Not on file  . Alcohol Use: No    Review of Systems Constitutional: No fever/chills Eyes: No visual changes. ENT: No sore throat. Cardiovascular: Denies chest pain. Respiratory: Denies shortness of breath. Gastrointestinal: No abdominal pain.  No nausea, no vomiting.  No diarrhea.  No  constipation. Genitourinary: Negative for dysuria. Musculoskeletal: Negative for back pain. Skin: Negative for rash. Neurological: Negative for headaches, focal weakness or numbness.  10-point ROS otherwise negative.  ____________________________________________   PHYSICAL EXAM:  VITAL SIGNS: ED Triage Vitals  Enc Vitals Group     BP 01/09/15 1035 111/94 mmHg     Pulse Rate 01/09/15 1035 92     Resp 01/09/15 1035 16     Temp 01/09/15 1035 97.9 F (36.6 C)     Temp Source 01/09/15 1035 Oral     SpO2 01/09/15 1035 97 %     Weight 01/09/15 1035 150 lb (68.04 kg)     Height 01/09/15 1035 5\' 2"  (1.575 m)     Head Cir --      Peak Flow --      Pain Score 01/09/15 1036 9     Pain Loc --      Pain Edu? --      Excl. in GC? --     Constitutional: Alert and oriented. Well appearing and in no acute distress. Eyes: Conjunctivae are normal. PERRL. EOMI. Head: Atraumatic. Nose: No congestion/rhinnorhea. Mouth/Throat: Mucous membranes are moist.  Oropharynx non-erythematous. Neck: No stridor. Hematological/Lymphatic/Immunilogical: No cervical lymphadenopathy. Cardiovascular: Normal rate, regular rhythm. Grossly normal heart sounds.  Good peripheral circulation. Respiratory: Normal respiratory effort.  No retractions. Lungs CTAB. Gastrointestinal: Soft and  nontender. No distention. No abdominal bruits. No CVA tenderness. Genitourinary: None Musculoskeletal: Positive lumbar sacral tenderness. Straight leg raise positive bilateral. Unable to lay completely flat for exam. Neurologic:  Normal speech and language. No gross focal neurologic deficits are appreciated. Speech is normal. No gait instability. Skin:  Skin is warm, dry and intact. No rash noted. Psychiatric: Mood and affect are normal. Speech and behavior are normal.  ____________________________________________   LABS (all labs ordered are listed, but only abnormal results are displayed)  Labs Reviewed - No data to  display ____________________________________________  EKG  None ____________________________________________  RADIOLOGY  None ____________________________________________   PROCEDURES  Procedure(s) performed: None  Critical Care performed: No  ____________________________________________   INITIAL IMPRESSION / ASSESSMENT AND PLAN / ED COURSE  Pertinent labs & imaging results that were available during my care of the patient were reviewed by me and considered in my medical decision making (see chart for details).  Discussed course of action the ER visit use for chronic pain, and her chronic pain plan and program. Encourage patient to establish care with local PCP or go to pain management. ____________________________________________   FINAL CLINICAL IMPRESSION(S) / ED DIAGNOSES  Final diagnoses:  Bilateral low back pain with sciatica, sciatica laterality unspecified      Evangeline DakinCharles M Beers, PA-C 01/09/15 1202  Darien Ramusavid W Kaminski, MD 01/09/15 [phone]

## 2015-01-09 NOTE — ED Notes (Signed)
Pinched nerve since Sunday.  Says pain down left leg

## 2015-01-09 NOTE — Discharge Instructions (Signed)
Chronic Back Pain  When back pain lasts longer than 3 months, it is called chronic back pain.People with chronic back pain often go through certain periods that are more intense (flare-ups).  CAUSES Chronic back pain can be caused by wear and tear (degeneration) on different structures in your back. These structures include:  The bones of your spine (vertebrae) and the joints surrounding your spinal cord and nerve roots (facets).  The strong, fibrous tissues that connect your vertebrae (ligaments). Degeneration of these structures may result in pressure on your nerves. This can lead to constant pain. HOME CARE INSTRUCTIONS  Avoid bending, heavy lifting, prolonged sitting, and activities which make the problem worse.  Take brief periods of rest throughout the day to reduce your pain. Lying down or standing usually is better than sitting while you are resting.  Take over-the-counter or prescription medicines only as directed by your caregiver. SEEK IMMEDIATE MEDICAL CARE IF:   You have weakness or numbness in one of your legs or feet.  You have trouble controlling your bladder or bowels.  You have nausea, vomiting, abdominal pain, shortness of breath, or fainting. Document Released: 09/18/2004 Document Revised: 11/03/2011 Document Reviewed: 07/26/2011 [facility] Patient Information 2015 [place]. This information is not intended to replace advice given to you by your health care provider. Make sure you discuss any questions you have with your health care provider. Emergency care providers appreciate that many patients coming to us are in severe pain and we wish to address their pain in the safest, most responsible manner.  It is important to recognize, however, that the proper treatment of chronic pain differs from that of the pain of injuries and acute illnesses.  Our goal is to provider quality, safe, personalized care and we thank you for giving us the opportunity to serve  you.     The use of narcotics and related agents for chronic pain syndromes may lead to additional physical and psychological problems.  Nearly as many people die from prescription narcotics each year as die from car crashes.  Additionally, this risk is increased if such prescriptions are obtained from a variety of sources.  Therefore, only your primary care physician or a pain management specialist is able to safely treat such syndromes with narcotic medications long-term.  Documentation revealing such prescriptions have been sought from multiple sources may prohibit us from providing a refill or different narcotic medication.  Your name may be checked first through the [facility] [place] Controlled Substances Reporting System.  This database is a record of controlled substance medication prescriptions that the patient has received.  This has been established by [facility] in an effort to eliminate the dangerous, and often life-threatening, practice of obtaining multiple prescriptions from different medical providers.  If you have a chronic pain syndrome (i.e. chronic headaches, recurrent back or neck pain, dental pain, abdominal or pelvic pain without a specific diagnosis, or neuropathic pain such as fibromyalgia) or recurrent visits for the same condition without an acute diagnosis, you may be treated with non-narcotics and other non-addictive medicines.  Allergic reactions or negative side effects that may be reported by a patient to such medications will not typically lead to the use of a narcotic analgesic or other controlled substance as an alternative.  Patients managing chronic pain with a personal physician should have provisions in place for breakthrough pain.  If you are in crisis, you should call your physician.  If your physician directs you to the emergency department, please have the  doctor call and speak to our attending physician concerning your care.  When patients come to the  Emergency Department (ED) with acute medical conditions in which the ED physician feels it is appropriate to prescribe narcotic or sedating pain medication, the physician will prescribe these in very limited quantities.  The amount of these medications will last only until you can see your primary care physician in his/her office.  Any patient who returns to the ED seeking refills should expect only non-narcotic pain medications.  In the event an acute medical condition exists and the emergency physician feels it is necessary that the patient be given a narcotic or sedating medication, a responsible adult driver should be present in the room prior to the medication being given by the nurse.  Prescriptions for narcotic or sedating medications that have been lost, stolen, or expired will NOT be refilled in the ED.  Patients who have chronic pain may receive non-narcotic prescriptions until seen by their primary care physician.  It is every patient's personal responsibility to maintain active prescriptions with his or her primary care physician or specialist.
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
## 2015-02-01 ENCOUNTER — Emergency Department
Admission: EM | Admit: 2015-02-01 | Discharge: 2015-02-01 | Disposition: A | Discharge status: Home / Self Care | Payer: Self-pay | Attending: Emergency Medicine | Admitting: Emergency Medicine

## 2015-02-01 ENCOUNTER — Encounter: Payer: Self-pay | Admitting: Emergency Medicine

## 2015-02-01 DIAGNOSIS — G8929 Other chronic pain: Secondary | ICD-10-CM | POA: Insufficient documentation

## 2015-02-01 DIAGNOSIS — Z720 Tobacco use: Secondary | ICD-10-CM | POA: Insufficient documentation

## 2015-02-01 DIAGNOSIS — M549 Dorsalgia, unspecified: Secondary | ICD-10-CM | POA: Insufficient documentation

## 2015-02-01 HISTORY — DX: Other chronic pain: G89.29

## 2015-02-01 HISTORY — DX: Dorsalgia, unspecified: M54.9

## 2015-02-01 LAB — URINE DRUG SCREEN, QUALITATIVE (ARMC ONLY)
AMPHETAMINES, UR SCREEN: NOT DETECTED
Barbiturates, Ur Screen: NOT DETECTED
Benzodiazepine, Ur Scrn: POSITIVE — AB
COCAINE METABOLITE, UR ~~LOC~~: NOT DETECTED
Cannabinoid 50 Ng, Ur ~~LOC~~: POSITIVE — AB
MDMA (Ecstasy)Ur Screen: NOT DETECTED
Methadone Scn, Ur: NOT DETECTED
OPIATE, UR SCREEN: NOT DETECTED
Phencyclidine (PCP) Ur S: NOT DETECTED
TRICYCLIC, UR SCREEN: POSITIVE — AB

## 2015-02-01 NOTE — ED Notes (Signed)
Presents with pain to lower back and it is radiating to left leg

## 2015-02-01 NOTE — Discharge Instructions (Signed)
Emergency care providers appreciate that many patients coming to us are in severe pain and we wish to address their pain in the safest, most responsible manner.  It is important to recognize, however, that the proper treatment of chronic pain differs from that of the pain of injuries and acute illnesses.  Our goal is to provider quality, safe, personalized care and we thank you for giving us the opportunity to serve you.  The use of narcotics and related agents for chronic pain syndromes may lead to additional physical and psychological problems.  Nearly as many people die from prescription narcotics each year as die from car crashes.  Additionally, this risk is increased if such prescriptions are obtained from a variety of sources.  Therefore, only your primary care physician or a pain management specialist is able to safely treat such syndromes with narcotic medications long-term.  Documentation revealing such prescriptions have been sought from multiple sources may prohibit us from providing a refill or different narcotic medication.  Your name may be checked first through the [place] Controlled Substances Reporting System.  This database is a record of controlled substance medication prescriptions that the patient has received.  This has been established by [place] in an effort to eliminate the dangerous, and often life-threatening, practice of obtaining multiple prescriptions from different medical providers.  If you have a chronic pain syndrome (i.e. chronic headaches, recurrent back or neck pain, dental pain, abdominal or pelvic pain without a specific diagnosis, or neuropathic pain such as fibromyalgia) or recurrent visits for the same condition without an acute diagnosis, you may be treated with non-narcotics and other non-addictive medicines.  Allergic reactions or negative side effects that may be reported by a patient to such medications will not typically lead to the use of a  narcotic analgesic or other controlled substance as an alternative.  Patients managing chronic pain with a personal physician should have provisions in place for breakthrough pain.  If you are in crisis, you should call your physician.  If your physician directs you to the emergency department, please have the doctor call and speak to our attending physician concerning your care.  When patients come to the Emergency Department (ED) with acute medical conditions in which the ED physician feels it is appropriate to prescribe narcotic or sedating pain medication, the physician will prescribe these in very limited quantities.  The amount of these medications will last only until you can see your primary care physician in his/her office.  Any patient who returns to the ED seeking refills should expect only non-narcotic pain medications.  In the event an acute medical condition exists and the emergency physician feels it is necessary that the patient be given a narcotic or sedating medication, a responsible adult driver should be present in the room prior to the medication being given by the nurse.  Prescriptions for narcotic or sedating medications that have been lost, stolen, or expired will NOT be refilled in the ED.  Patients who have chronic pain may receive non-narcotic prescriptions until seen by their primary care physician.  It is every patient's personal responsibility to maintain active prescriptions with his or her primary care physician or specialist.  

## 2015-02-01 NOTE — ED Provider Notes (Signed)
[facility] Emergency Department Provider Note  ____________________________________________  Time seen: Approximately 2:14 PM  I have reviewed the triage vital signs and the nursing notes.   HISTORY  Chief Complaint Back Pain  HPI Nicole Chandler is a 54 y.o. female who presents to the emergency department with a complaint of back pain that radiates into the left leg. She denies having taken any medications for pain prior to arrival today.   Past Medical History  Diagnosis Date  . Chronic back pain unk    There are no active problems to display for this patient.   History reviewed. No pertinent past surgical history.  Current Outpatient Rx  Name  Route  Sig  Dispense  Refill  . chlorpheniramine-HYDROcodone (TUSSIONEX) 10-8 MG/5ML SUER   Oral   Take 5 mLs by mouth once.   70 mL   0   . cyclobenzaprine (FLEXERIL) 10 MG tablet   Oral   Take 1 tablet (10 mg total) by mouth every 8 (eight) hours as needed for muscle spasms.   30 tablet   1   . ibuprofen (MOTRIN IB) 200 MG tablet   Oral   Take 4 tablets (800 mg total) by mouth every 8 (eight) hours as needed for moderate pain. Take one tablet by mouth every 8 hours as needed for moderate pain.   15 tablet   0   . oxyCODONE-acetaminophen (PERCOCET) 10-325 MG per tablet   Oral   Take 1 tablet by mouth every 6 (six) hours as needed for pain.   20 tablet   0     Allergies Review of patient's allergies indicates no known allergies.  Family History  Problem Relation Age of Onset  . Alzheimer's disease Mother   . AAA (abdominal aortic aneurysm) Father     Social History History  Substance Use Topics  . Smoking status: Current Every Day Smoker    Last Attempt to Quit: 08/27/2014  . Smokeless tobacco: Not on file  . Alcohol Use: No    Review of Systems Constitutional: No fever/chills Eyes: No visual changes. ENT: No sore throat. Cardiovascular: Denies chest pain. Respiratory:  Denies shortness of breath. Gastrointestinal: No abdominal pain.  No nausea, no vomiting.  Genitourinary: Negative for dysuria. Musculoskeletal: Negative for back pain. Skin: Negative for rash. Neurological: Negative for headaches, focal weakness or numbness.  10-point ROS otherwise negative.  ____________________________________________   PHYSICAL EXAM:  VITAL SIGNS: ED Triage Vitals  Enc Vitals Group     BP 02/01/15 1216 140/98 mmHg     Pulse Rate 02/01/15 1216 74     Resp 02/01/15 1216 20     Temp 02/01/15 1216 98.1 F (36.7 C)     Temp Source 02/01/15 1216 Oral     SpO2 02/01/15 1216 98 %     Weight 02/01/15 1216 135 lb (61.236 kg)     Height 02/01/15 1216  (1.6 m)     Head Cir --      Peak Flow --      Pain Score 02/01/15 1217 9     Pain Loc --      Pain Edu? --      Excl. in GC? --     Constitutional: Upon entering the room patient was sleeping soundly. She did not awaken to voice. She awoke to gentle shaking of her left leg. She remained very drowsy throughout the exam. Eyes: Conjunctivae are normal. Pupils equal; 1 mm;  EOMI. Head: Atraumatic. Nose:  No congestion/rhinnorhea. Mouth/Throat: Mucous membranes are moist.  Oropharynx non-erythematous. Neck: No stridor.   Cardiovascular: Normal rate, regular rhythm. Grossly normal heart sounds.  Good peripheral circulation. Respiratory: Normal respiratory effort.  No retractions. Lungs CTAB. Gastrointestinal: Soft and nontender. No distention. No abdominal bruits. No CVA tenderness. Musculoskeletal: No lower extremity tenderness nor edema.  No joint effusions. Neurologic:  Ambulates unassisted. Cranial nerves intact.  Skin:  Skin is warm, dry and intact. No rash noted. Psychiatric: Flat affect;   ____________________________________________   LABS (all labs ordered are listed, but only abnormal results are displayed)  Labs Reviewed  URINE DRUG SCREEN, QUALITATIVE ([facility] ONLY) - Abnormal; Notable for the  following:    Tricyclic, Ur Screen POSITIVE (*)    Cannabinoid 50 Ng, Ur [place] POSITIVE (*)    Benzodiazepine, Ur Scrn POSITIVE (*)    All other components within normal limits   ____________________________________________  EKG   ____________________________________________  RADIOLOGY   ____________________________________________   PROCEDURES  Procedure(s) performed: None  Critical Care performed: No  ____________________________________________   INITIAL IMPRESSION / ASSESSMENT AND PLAN / ED COURSE  Pertinent labs & imaging results that were available during my care of the patient were reviewed by me and considered in my medical decision making (see chart for details).  Patient to be discharged home. She was advised to follow up with pain management. She was advised that the ER does not manage chronic pain. She was observed walking out of the department without assistance.  ____________________________________________   FINAL CLINICAL IMPRESSION(S) / ED DIAGNOSES  Final diagnoses:  Chronic back pain greater than 3 months duration      [name], FNP 02/01/15 1600  Emily Filbert, MD 02/02/15 1257

## 2015-02-14 ENCOUNTER — Emergency Department
Admission: EM | Admit: 2015-02-14 | Discharge: 2015-02-14 | Disposition: A | Discharge status: Home / Self Care | Payer: Self-pay | Attending: Student | Admitting: Student

## 2015-02-14 ENCOUNTER — Emergency Department: Payer: Self-pay

## 2015-02-14 ENCOUNTER — Encounter: Payer: Self-pay | Admitting: Emergency Medicine

## 2015-02-14 DIAGNOSIS — S46812A Strain of other muscles, fascia and tendons at shoulder and upper arm level, left arm, initial encounter: Secondary | ICD-10-CM | POA: Insufficient documentation

## 2015-02-14 DIAGNOSIS — Z720 Tobacco use: Secondary | ICD-10-CM | POA: Insufficient documentation

## 2015-02-14 DIAGNOSIS — Z79899 Other long term (current) drug therapy: Secondary | ICD-10-CM | POA: Insufficient documentation

## 2015-02-14 DIAGNOSIS — M79676 Pain in unspecified toe(s): Secondary | ICD-10-CM | POA: Insufficient documentation

## 2015-02-14 DIAGNOSIS — X58XXXA Exposure to other specified factors, initial encounter: Secondary | ICD-10-CM | POA: Insufficient documentation

## 2015-02-14 DIAGNOSIS — Y9389 Activity, other specified: Secondary | ICD-10-CM | POA: Insufficient documentation

## 2015-02-14 DIAGNOSIS — M25539 Pain in unspecified wrist: Secondary | ICD-10-CM | POA: Insufficient documentation

## 2015-02-14 DIAGNOSIS — Y998 Other external cause status: Secondary | ICD-10-CM | POA: Insufficient documentation

## 2015-02-14 DIAGNOSIS — Y9289 Other specified places as the place of occurrence of the external cause: Secondary | ICD-10-CM | POA: Insufficient documentation

## 2015-02-14 MED ORDER — METHOCARBAMOL 500 MG PO TABS
500.0000 mg | ORAL_TABLET | Freq: Four times a day (QID) | ORAL | Status: DC | PRN
Start: 2015-02-14 — End: 2015-04-03

## 2015-02-14 MED ORDER — HYDROCODONE-ACETAMINOPHEN 5-325 MG PO TABS: 1.0000 | ORAL_TABLET | ORAL | Status: DC | PRN

## 2015-02-14 MED ORDER — IBUPROFEN 800 MG PO TABS: 800.0000 mg | ORAL_TABLET | Freq: Three times a day (TID) | ORAL | Status: DC | PRN

## 2015-02-14 MED ORDER — KETOROLAC TROMETHAMINE 60 MG/2ML IM SOLN
INTRAMUSCULAR | Status: AC
Start: 2015-02-14 — End: 2015-02-14
  Administered 2015-02-14: 60 mg via INTRAMUSCULAR
  Filled 2015-02-14: qty 2

## 2015-02-14 MED ORDER — KETOROLAC TROMETHAMINE 60 MG/2ML IM SOLN
60.0000 mg | Freq: Once | INTRAMUSCULAR | Status: AC
  Administered 2015-02-14: 60 mg via INTRAMUSCULAR

## 2015-02-14 NOTE — Discharge Instructions (Signed)

## 2015-02-14 NOTE — ED Notes (Signed)
Pt here with c/o left shoulder pain, left wrist and bilateral toe pain. Pt reports pain has been going on for 2 years.
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
## 2015-03-01 ENCOUNTER — Emergency Department
Admission: EM | Admit: 2015-03-01 | Discharge: 2015-03-01 | Disposition: A | Discharge status: Home / Self Care | Payer: Self-pay | Attending: Emergency Medicine | Admitting: Emergency Medicine

## 2015-03-01 ENCOUNTER — Encounter: Payer: Self-pay | Admitting: Medical Oncology

## 2015-03-01 DIAGNOSIS — M545 Low back pain: Secondary | ICD-10-CM | POA: Insufficient documentation

## 2015-03-01 DIAGNOSIS — G8929 Other chronic pain: Secondary | ICD-10-CM | POA: Insufficient documentation

## 2015-03-01 DIAGNOSIS — M25512 Pain in left shoulder: Secondary | ICD-10-CM | POA: Insufficient documentation

## 2015-03-01 DIAGNOSIS — M25559 Pain in unspecified hip: Secondary | ICD-10-CM | POA: Insufficient documentation

## 2015-03-01 DIAGNOSIS — Z720 Tobacco use: Secondary | ICD-10-CM | POA: Insufficient documentation

## 2015-03-01 DIAGNOSIS — Z79899 Other long term (current) drug therapy: Secondary | ICD-10-CM | POA: Insufficient documentation

## 2015-03-01 DIAGNOSIS — Z791 Long term (current) use of non-steroidal anti-inflammatories (NSAID): Secondary | ICD-10-CM | POA: Insufficient documentation

## 2015-03-01 MED ORDER — NAPROXEN 500 MG PO TABS: 500.0000 mg | ORAL_TABLET | Freq: Two times a day (BID) | ORAL | Status: DC

## 2015-03-01 MED ORDER — KETOROLAC TROMETHAMINE 60 MG/2ML IM SOLN
60.0000 mg | Freq: Once | INTRAMUSCULAR | Status: AC
  Administered 2015-03-01: 60 mg via INTRAMUSCULAR
  Filled 2015-03-01: qty 2

## 2015-03-01 NOTE — ED Notes (Signed)
Patient left without e-signing paper work.  Discharge instructions were explained and patient verbalized understanding.  Patient verbalized understanding that she needs to follow-up with the pain management clinic for her chronic pain instead of returning to the ED.

## 2015-03-01 NOTE — Discharge Instructions (Signed)
Emergency care providers appreciate that many patients coming to us are in severe pain and we wish to address their pain in the safest, most responsible manner.  It is important to recognize, however, that the proper treatment of chronic pain differs from that of the pain of injuries and acute illnesses.  Our goal is to provider quality, safe, personalized care and we thank you for giving us the opportunity to serve you.  The use of narcotics and related agents for chronic pain syndromes may lead to additional physical and psychological problems.  Nearly as many people die from prescription narcotics each year as die from car crashes.  Additionally, this risk is increased if such prescriptions are obtained from a variety of sources.  Therefore, only your primary care physician or a pain management specialist is able to safely treat such syndromes with narcotic medications long-term.  Documentation revealing such prescriptions have been sought from multiple sources may prohibit us from providing a refill or different narcotic medication.  Your name may be checked first through the [place] Controlled Substances Reporting System.  This database is a record of controlled substance medication prescriptions that the patient has received.  This has been established by [place] in an effort to eliminate the dangerous, and often life-threatening, practice of obtaining multiple prescriptions from different medical providers.  If you have a chronic pain syndrome (i.e. chronic headaches, recurrent back or neck pain, dental pain, abdominal or pelvic pain without a specific diagnosis, or neuropathic pain such as fibromyalgia) or recurrent visits for the same condition without an acute diagnosis, you may be treated with non-narcotics and other non-addictive medicines.  Allergic reactions or negative side effects that may be reported by a patient to such medications will not typically lead to the use of a  narcotic analgesic or other controlled substance as an alternative.  Patients managing chronic pain with a personal physician should have provisions in place for breakthrough pain.  If you are in crisis, you should call your physician.  If your physician directs you to the emergency department, please have the doctor call and speak to our attending physician concerning your care.  When patients come to the Emergency Department (ED) with acute medical conditions in which the ED physician feels it is appropriate to prescribe narcotic or sedating pain medication, the physician will prescribe these in very limited quantities.  The amount of these medications will last only until you can see your primary care physician in his/her office.  Any patient who returns to the ED seeking refills should expect only non-narcotic pain medications.  In the event an acute medical condition exists and the emergency physician feels it is necessary that the patient be given a narcotic or sedating medication, a responsible adult driver should be present in the room prior to the medication being given by the nurse.  Prescriptions for narcotic or sedating medications that have been lost, stolen, or expired will NOT be refilled in the ED.  Patients who have chronic pain may receive non-narcotic prescriptions until seen by their primary care physician.  It is every patient's personal responsibility to maintain active prescriptions with his or her primary care physician or specialist.  

## 2015-03-01 NOTE — ED Provider Notes (Signed)
[facility] Emergency Department Provider Note ____________________________________________  Time seen: Approximately 5:00 PM  I have reviewed the triage vital signs and the nursing notes.   HISTORY  Chief Complaint Back Pain and Hip Pain   HPI Nicole Chandler is a 54 y.o. female who presents to the emergency department for evaluation of left shoulder and right lower back pain. She reports having pain x 2 years. No recent back injury. Dog "pulled" on her left shoulder today and made the shoulder pain worse.    Past Medical History  Diagnosis Date  . Chronic back pain unk    There are no active problems to display for this patient.   Past Surgical History  Procedure Laterality Date  . Eye surgery    . Abdominal surgery      Current Outpatient Rx  Name  Route  Sig  Dispense  Refill  . gabapentin (NEURONTIN) 600 MG tablet   Oral   Take 600 mg by mouth 2 (two) times daily.         Marland Kitchen HYDROcodone-acetaminophen (NORCO) 5-325 MG per tablet   Oral   Take 1-2 tablets by mouth every 4 (four) hours as needed for moderate pain.   10 tablet   0   . ibuprofen (ADVIL,MOTRIN) 800 MG tablet   Oral   Take 1 tablet (800 mg total) by mouth every 8 (eight) hours as needed.   30 tablet   0   . methocarbamol (ROBAXIN) 500 MG tablet   Oral   Take 1 tablet (500 mg total) by mouth every 6 (six) hours as needed for muscle spasms.   30 tablet   0   . naproxen (NAPROSYN) 500 MG tablet   Oral   Take 1 tablet (500 mg total) by mouth 2 (two) times daily with a meal.   60 tablet   2     Allergies Codeine  Family History  Problem Relation Age of Onset  . Alzheimer's disease Mother   . AAA (abdominal aortic aneurysm) Father     Social History History  Substance Use Topics  . Smoking status: Current Some Day Smoker    Last Attempt to Quit: 08/27/2014  . Smokeless tobacco: Not on file  . Alcohol Use: No    Review of Systems Constitutional: No  recent illness. Eyes: No visual changes. ENT: No sore throat. Cardiovascular: Denies chest pain or palpitations. Respiratory: Denies shortness of breath. Gastrointestinal: No abdominal pain.  Genitourinary: Negative for dysuria. Musculoskeletal: Pain in left shoulder and right lower back Skin: Negative for rash. Neurological: Negative for headaches, focal weakness or numbness. 10-point ROS otherwise negative.  ____________________________________________   PHYSICAL EXAM:  VITAL SIGNS: ED Triage Vitals  Enc Vitals Group     BP 03/01/15 1555 116/79 mmHg     Pulse Rate 03/01/15 1555 69     Resp 03/01/15 1555 18     Temp 03/01/15 1555 98.1 F (36.7 C)     Temp Source 03/01/15 1555 Oral     SpO2 03/01/15 1555 96 %     Weight 03/01/15 1555 145 lb (65.772 kg)     Height 03/01/15 1555  (1.6 m)     Head Cir --      Peak Flow --      Pain Score 03/01/15 1555 9     Pain Loc --      Pain Edu? --      Excl. in GC? --     Constitutional:  Alert and oriented. Well appearing and in no acute distress. Eyes: Conjunctivae are normal. EOMI. Head: Atraumatic. Nose: No congestion/rhinnorhea. Neck: No stridor.  Respiratory: Normal respiratory effort.   Musculoskeletal: Reports extreme and sever pain with light touch of both left shoulder and lower back. No deformity or external sign of trauma to either.  Neurologic:  Slow speech. No gross focal neurologic deficits are appreciated. Speech is deliberate. Pupils are 2+.  No gait instability. Ambulates without assistance. Skin:  Skin is warm, dry and intact. Atraumatic. Psychiatric: Mood and affect are flat. ____________________________________________   LABS (all labs ordered are listed, but only abnormal results are displayed)  Labs Reviewed - No data to display ____________________________________________  RADIOLOGY  Not indicated. ____________________________________________   PROCEDURES  Procedure(s) performed:  None   ____________________________________________   INITIAL IMPRESSION / ASSESSMENT AND PLAN / ED COURSE  Pertinent labs & imaging results that were available during my care of the patient were reviewed by me and considered in my medical decision making (see chart for details).  Patient was advised the emergency department does not manage chronic pain with narcotic medications. She was advised to follow up with pain management. A copy of the policy was provided at discharge. ____________________________________________   FINAL CLINICAL IMPRESSION(S) / ED DIAGNOSES  Final diagnoses:  Chronic shoulder pain, left  Chronic lower back pain    Chinita PesterCari B Dontreal Miera, FNP 03/01/15 1720  Maurilio LovelyNoelle McLaurin, MD 03/02/15 0025

## 2015-03-01 NOTE — ED Notes (Signed)
Pt to triage with reports that she has been having lower back pain and hip pain that began this am. Denies injury.

## 2015-03-02 ENCOUNTER — Telehealth: Payer: Self-pay | Admitting: Emergency Medicine

## 2015-03-02 NOTE — ED Notes (Signed)
Patient called saying she needs referral to pain clinic.  She is referred to dr crisp on instructions.  Put referral in dr crisps in box.  Also advised patient to call pcp about her pain to see if they can do anything in the interim.

## 2015-03-15 ENCOUNTER — Ambulatory Visit: Payer: Self-pay | Admitting: Pain Medicine

## 2015-04-03 ENCOUNTER — Emergency Department
Admission: EM | Admit: 2015-04-03 | Discharge: 2015-04-03 | Disposition: A | Discharge status: Home / Self Care | Payer: Self-pay | Attending: Emergency Medicine | Admitting: Emergency Medicine

## 2015-04-03 ENCOUNTER — Encounter: Payer: Self-pay | Admitting: Emergency Medicine

## 2015-04-03 DIAGNOSIS — M25551 Pain in right hip: Secondary | ICD-10-CM | POA: Insufficient documentation

## 2015-04-03 DIAGNOSIS — G894 Chronic pain syndrome: Secondary | ICD-10-CM

## 2015-04-03 DIAGNOSIS — G8929 Other chronic pain: Secondary | ICD-10-CM | POA: Insufficient documentation

## 2015-04-03 DIAGNOSIS — Z720 Tobacco use: Secondary | ICD-10-CM | POA: Insufficient documentation

## 2015-04-03 DIAGNOSIS — F1110 Opioid abuse, uncomplicated: Secondary | ICD-10-CM | POA: Insufficient documentation

## 2015-04-03 DIAGNOSIS — F1210 Cannabis abuse, uncomplicated: Secondary | ICD-10-CM | POA: Insufficient documentation

## 2015-04-03 DIAGNOSIS — M79604 Pain in right leg: Secondary | ICD-10-CM | POA: Insufficient documentation

## 2015-04-03 LAB — URINALYSIS COMPLETE WITH MICROSCOPIC (ARMC ONLY)
Bilirubin Urine: NEGATIVE
Glucose, UA: NEGATIVE mg/dL
Ketones, ur: NEGATIVE mg/dL
LEUKOCYTES UA: NEGATIVE
NITRITE: NEGATIVE
PROTEIN: NEGATIVE mg/dL
SPECIFIC GRAVITY, URINE: 1.001 — AB (ref 1.005–1.030)
SQUAMOUS EPITHELIAL / LPF: NONE SEEN
pH: 6 (ref 5.0–8.0)

## 2015-04-03 LAB — URINE DRUG SCREEN, QUALITATIVE (ARMC ONLY)
AMPHETAMINES, UR SCREEN: NOT DETECTED
BENZODIAZEPINE, UR SCRN: NOT DETECTED
Barbiturates, Ur Screen: NOT DETECTED
Cannabinoid 50 Ng, Ur ~~LOC~~: POSITIVE — AB
Cocaine Metabolite,Ur ~~LOC~~: NOT DETECTED
MDMA (ECSTASY) UR SCREEN: NOT DETECTED
Methadone Scn, Ur: POSITIVE — AB
Opiate, Ur Screen: NOT DETECTED
Phencyclidine (PCP) Ur S: NOT DETECTED
Tricyclic, Ur Screen: NOT DETECTED

## 2015-04-03 MED ORDER — BACLOFEN 10 MG PO TABS: 10.0000 mg | ORAL_TABLET | Freq: Three times a day (TID) | ORAL | Status: DC

## 2015-04-03 MED ORDER — DIAZEPAM 5 MG/ML IJ SOLN
5.0000 mg | Freq: Once | INTRAMUSCULAR | Status: AC
  Administered 2015-04-03: 5 mg via INTRAMUSCULAR
  Filled 2015-04-03: qty 2

## 2015-04-03 MED ORDER — KETOROLAC TROMETHAMINE 60 MG/2ML IM SOLN
60.0000 mg | Freq: Once | INTRAMUSCULAR | Status: AC
  Administered 2015-04-03: 60 mg via INTRAMUSCULAR
  Filled 2015-04-03: qty 2

## 2015-04-03 MED ORDER — OXYCODONE-ACETAMINOPHEN 5-325 MG PO TABS
2.0000 | ORAL_TABLET | Freq: Once | ORAL | Status: AC
  Administered 2015-04-03: 2 via ORAL
  Filled 2015-04-03: qty 2

## 2015-04-03 NOTE — ED Notes (Signed)
Pt to ed with c/o bilat leg pain x years,  States 2 months ago her md changed her to a different medication and she has had nausea and pain in legs worse.

## 2015-04-03 NOTE — Discharge Instructions (Signed)
Chronic Back Pain ° When back pain lasts longer than 3 months, it is called chronic back pain. People with chronic back pain often go through certain periods that are more intense (flare-ups).  °CAUSES °Chronic back pain can be caused by wear and tear (degeneration) on different structures in your back. These structures include: °· The bones of your spine (vertebrae) and the joints surrounding your spinal cord and nerve roots (facets). °· The strong, fibrous tissues that connect your vertebrae (ligaments). °Degeneration of these structures may result in pressure on your nerves. This can lead to constant pain. °HOME CARE INSTRUCTIONS °· Avoid bending, heavy lifting, prolonged sitting, and activities which make the problem worse. °· Take brief periods of rest throughout the day to reduce your pain. Lying down or standing usually is better than sitting while you are resting. °· Take over-the-counter or prescription medicines only as directed by your caregiver. °SEEK IMMEDIATE MEDICAL CARE IF:  °· You have weakness or numbness in one of your legs or feet. °· You have trouble controlling your bladder or bowels. °· You have nausea, vomiting, abdominal pain, shortness of breath, or fainting. °Document Released: 09/18/2004 Document Revised: 11/03/2011 Document Reviewed: 07/26/2011 °ExitCare® Patient Information ©2015 [employer]. This information is not intended to replace advice given to you by your health care provider. Make sure you discuss any questions you have with your health care provider. ° ° °Chronic Pain °Chronic pain can be defined as pain that is off and on and lasts for 3-6 months or longer. Many things cause chronic pain, which can make it difficult to make a diagnosis. There are many treatment options available for chronic pain. However, finding a treatment that works well for you may require trying various approaches until the right one is found. Many people benefit from a combination of two or more types  of treatment to control their pain. °SYMPTOMS  °Chronic pain can occur anywhere in the body and can range from mild to very severe. Some types of chronic pain include: °· Headache. °· Low back pain. °· Cancer pain. °· Arthritis pain. °· Neurogenic pain. This is pain resulting from damage to nerves. ° People with chronic pain may also have other symptoms such as: °· Depression. °· Anger. °· Insomnia. °· Anxiety. °DIAGNOSIS  °Your health care provider will help diagnose your condition over time. In many cases, the initial focus will be on excluding possible conditions that could be causing the pain. Depending on your symptoms, your health care provider may order tests to diagnose your condition. Some of these tests may include:  °· Blood tests.   °· CT scan.   °· MRI.   °· X-rays.   °· Ultrasounds.   °· Nerve conduction studies.   °You may need to see a specialist.  °TREATMENT  °Finding treatment that works well may take time. You may be referred to a pain specialist. He or she may prescribe medicine or therapies, such as:  °· Mindful meditation or yoga. °· Shots (injections) of numbing or pain-relieving medicines into the spine or area of pain. °· Local electrical stimulation. °· Acupuncture.   °· Massage therapy.   °· Aroma, color, light, or sound therapy.   °· Biofeedback.   °· Working with a physical therapist to keep from getting stiff.   °· Regular, gentle exercise.   °· Cognitive or behavioral therapy.   °· Group support.   °Sometimes, surgery may be recommended.  °HOME CARE INSTRUCTIONS  °· Take all medicines as directed by your health care provider.   °· Lessen stress in your life by relaxing and   doing things such as listening to calming music.   °· Exercise or be active as directed by your health care provider.   °· Eat a healthy diet and include things such as vegetables, fruits, fish, and lean meats in your diet.   °· Keep all follow-up appointments with your health care provider.   °· Attend a support  group with others suffering from chronic pain. °SEEK MEDICAL CARE IF:  °· Your pain gets worse.   °· You develop a new pain that was not there before.   °· You cannot tolerate medicines given to you by your health care provider.   °· You have new symptoms since your last visit with your health care provider.   °SEEK IMMEDIATE MEDICAL CARE IF:  °· You feel weak.   °· You have decreased sensation or numbness.   °· You lose control of bowel or bladder function.   °· Your pain suddenly gets much worse.   °· You develop shaking. °· You develop chills. °· You develop confusion. °· You develop chest pain. °· You develop shortness of breath.   °MAKE SURE YOU: °· Understand these instructions. °· Will watch your condition. °· Will get help right away if you are not doing well or get worse. °Document Released: 05/03/2002 Document Revised: 04/13/2013 Document Reviewed: 02/04/2013 °ExitCare® Patient Information ©2015 [employer]. This information is not intended to replace advice given to you by your health care provider. Make sure you discuss any questions you have with your health care provider. ° ° °

## 2015-04-03 NOTE — ED Notes (Signed)
Chronic leg pain

## 2015-04-03 NOTE — ED Provider Notes (Signed)
[facility] Emergency Department Provider Note  ____________________________________________  Time seen: Approximately 3:11 PM  I have reviewed the triage vital signs and the nursing notes.   HISTORY  Chief Complaint Leg Pain   HPI Nicole Chandler is a 54 y.o. female who presents emergency room for right hip and leg pain. Patient states that she has a history of sciatica, does not have insurance and is unable to see a PCP. States that the pain medication she is taking, naproxen, is making her sick to her stomach. Can't stand the pain. Patient is very tearful during exam.  Past Medical History  Diagnosis Date  . Chronic back pain unk    There are no active problems to display for this patient.   Past Surgical History  Procedure Laterality Date  . Eye surgery    . Abdominal surgery      Current Outpatient Rx  Name  Route  Sig  Dispense  Refill  . baclofen (LIORESAL) 10 MG tablet   Oral   Take 1 tablet (10 mg total) by mouth 3 (three) times daily.   30 tablet   0   . gabapentin (NEURONTIN) 600 MG tablet   Oral   Take 600 mg by mouth 2 (two) times daily.         Marland Kitchen ibuprofen (ADVIL,MOTRIN) 800 MG tablet   Oral   Take 1 tablet (800 mg total) by mouth every 8 (eight) hours as needed.   30 tablet   0     Allergies Codeine  Family History  Problem Relation Age of Onset  . Alzheimer's disease Mother   . AAA (abdominal aortic aneurysm) Father     Social History History  Substance Use Topics  . Smoking status: Current Some Day Smoker    Last Attempt to Quit: 08/27/2014  . Smokeless tobacco: Not on file  . Alcohol Use: No    Review of Systems Constitutional: No fever/chills Eyes: No visual changes. ENT: No sore throat. Cardiovascular: Denies chest pain. Respiratory: Denies shortness of breath. Gastrointestinal: No abdominal pain.  No nausea, no vomiting.  No diarrhea.  No constipation. Genitourinary: Negative for  dysuria. Musculoskeletal: As a for low back pain and right leg pain. Skin: Negative for rash. Neurological: Negative for headaches, focal weakness or numbness.  10-point ROS otherwise negative.  ____________________________________________   PHYSICAL EXAM:  VITAL SIGNS: ED Triage Vitals  Enc Vitals Group     BP 04/03/15 1451 115/79 mmHg     Pulse Rate 04/03/15 1451 82     Resp 04/03/15 1451 18     Temp 04/03/15 1451 99.1 F (37.3 C)     Temp Source 04/03/15 1451 Oral     SpO2 04/03/15 1451 94 %     Weight 04/03/15 1451 140 lb (63.504 kg)     Height 04/03/15 1451  (1.575 m)     Head Cir --      Peak Flow --      Pain Score 04/03/15 1447 8     Pain Loc --      Pain Edu? --      Excl. in GC? --     Constitutional: Alert and oriented. Well appearing and in no acute distress. Eyes: Conjunctivae are normal. PERRL. EOMI. Head: Atraumatic. Nose: No congestion/rhinnorhea. Mouth/Throat: Mucous membranes are moist.  Oropharynx non-erythematous. Neck: No stridor.   Cardiovascular: Normal rate, regular rhythm. Grossly normal heart sounds.  Good peripheral circulation. Respiratory: Normal respiratory effort.  No  retractions. Lungs CTAB. Gastrointestinal: Soft and nontender. No distention. No abdominal bruits. No CVA tenderness. Musculoskeletal: Tender all over. Patient is very vocal during exam straight leg raise negative. Patient states there is pain to scalp or range of motion and ambulates without difficulty. Neurologic:  Normal speech and language. No gross focal neurologic deficits are appreciated. No gait instability. Skin:  Skin is warm, dry and intact. No rash noted. Psychiatric: Mood and affect are normal. Speech and behavior are normal.  ____________________________________________   LABS (all labs ordered are listed, but only abnormal results are displayed)  Labs Reviewed  URINE DRUG SCREEN, QUALITATIVE ([facility] ONLY) - Abnormal; Notable for the following:     Cannabinoid 50 Ng, Ur [place] POSITIVE (*)    Methadone Scn, Ur POSITIVE (*)    All other components within normal limits  URINALYSIS COMPLETEWITH MICROSCOPIC ([facility] ONLY) - Abnormal; Notable for the following:    Color, Urine COLORLESS (*)    APPearance CLEAR (*)    Specific Gravity, Urine 1.001 (*)    Hgb urine dipstick 1+ (*)    Bacteria, UA RARE (*)    All other components within normal limits   ____________________________________________    PROCEDURES  Procedure(s) performed: None  Critical Care performed: No  ____________________________________________   INITIAL IMPRESSION / ASSESSMENT AND PLAN / ED COURSE  Pertinent labs & imaging results that were available during my care of the patient were reviewed by me and considered in my medical decision making (see chart for details).  Patient states much improvement. Desires to to leave with prescription for baclofen 10 mg 3 times a day. She voices no other emergency medical complaints at this time and will follow-up as necessary. ____________________________________________   FINAL CLINICAL IMPRESSION(S) / ED DIAGNOSES  Final diagnoses:  Chronic pain disorder      Evangeline Dakin, PA-C 04/03/15 1906  Sharyn Creamer, MD 04/03/15 [phone]

## 2015-04-19 ENCOUNTER — Emergency Department: Payer: Self-pay

## 2015-04-19 ENCOUNTER — Emergency Department
Admission: EM | Admit: 2015-04-19 | Discharge: 2015-04-19 | Disposition: A | Discharge status: Home / Self Care | Payer: Self-pay | Attending: Emergency Medicine | Admitting: Emergency Medicine

## 2015-04-19 ENCOUNTER — Other Ambulatory Visit: Payer: Self-pay

## 2015-04-19 ENCOUNTER — Encounter: Payer: Self-pay | Admitting: Emergency Medicine

## 2015-04-19 DIAGNOSIS — Z720 Tobacco use: Secondary | ICD-10-CM | POA: Insufficient documentation

## 2015-04-19 DIAGNOSIS — R1010 Upper abdominal pain, unspecified: Secondary | ICD-10-CM

## 2015-04-19 DIAGNOSIS — K2970 Gastritis, unspecified, without bleeding: Secondary | ICD-10-CM | POA: Insufficient documentation

## 2015-04-19 DIAGNOSIS — Z79899 Other long term (current) drug therapy: Secondary | ICD-10-CM | POA: Insufficient documentation

## 2015-04-19 LAB — URINALYSIS COMPLETE WITH MICROSCOPIC (ARMC ONLY)
Bacteria, UA: NONE SEEN
Bilirubin Urine: NEGATIVE
Glucose, UA: NEGATIVE mg/dL
KETONES UR: NEGATIVE mg/dL
LEUKOCYTES UA: NEGATIVE
Nitrite: NEGATIVE
PH: 6 (ref 5.0–8.0)
PROTEIN: NEGATIVE mg/dL
SPECIFIC GRAVITY, URINE: 1.002 — AB (ref 1.005–1.030)
Squamous Epithelial / LPF: NONE SEEN

## 2015-04-19 LAB — CBC
HEMATOCRIT: 45.7 % (ref 35.0–47.0)
Hemoglobin: 14.8 g/dL (ref 12.0–16.0)
MCH: 29.9 pg (ref 26.0–34.0)
MCHC: 32.5 g/dL (ref 32.0–36.0)
MCV: 92.3 fL (ref 80.0–100.0)
Platelets: 466 10*3/uL — ABNORMAL HIGH (ref 150–440)
RBC: 4.95 MIL/uL (ref 3.80–5.20)
RDW: 14.9 % — ABNORMAL HIGH (ref 11.5–14.5)
WBC: 13.3 10*3/uL — AB (ref 3.6–11.0)

## 2015-04-19 LAB — COMPREHENSIVE METABOLIC PANEL
ALT: 7 U/L — AB (ref 14–54)
AST: 20 U/L (ref 15–41)
Albumin: 5 g/dL (ref 3.5–5.0)
Alkaline Phosphatase: 66 U/L (ref 38–126)
Anion gap: 12 (ref 5–15)
BUN: 11 mg/dL (ref 6–20)
CHLORIDE: 104 mmol/L (ref 101–111)
CO2: 22 mmol/L (ref 22–32)
Calcium: 9.6 mg/dL (ref 8.9–10.3)
Creatinine, Ser: 1.33 mg/dL — ABNORMAL HIGH (ref 0.44–1.00)
GFR calc non Af Amer: 44 mL/min — ABNORMAL LOW (ref >60)
GFR, EST AFRICAN AMERICAN: 51 mL/min — AB (ref >60)
Glucose, Bld: 98 mg/dL (ref 65–99)
Potassium: 3.9 mmol/L (ref 3.5–5.1)
Sodium: 138 mmol/L (ref 135–145)
Total Bilirubin: 0.6 mg/dL (ref 0.3–1.2)
Total Protein: 8.6 g/dL — ABNORMAL HIGH (ref 6.5–8.1)

## 2015-04-19 LAB — TROPONIN I: Troponin I: <0.03 ng/mL (ref <0.031)

## 2015-04-19 LAB — LIPASE, BLOOD: LIPASE: 51 U/L (ref 22–51)

## 2015-04-19 MED ORDER — SODIUM CHLORIDE 0.9 % IV BOLUS (SEPSIS)
1000.0000 mL | Freq: Once | INTRAVENOUS | Status: AC
  Administered 2015-04-19: 1000 mL via INTRAVENOUS

## 2015-04-19 MED ORDER — MORPHINE SULFATE (PF) 4 MG/ML IV SOLN
4.0000 mg | Freq: Once | INTRAVENOUS | Status: AC
  Administered 2015-04-19: 4 mg via INTRAVENOUS
  Filled 2015-04-19: qty 1

## 2015-04-19 MED ORDER — DICYCLOMINE HCL 20 MG PO TABS: 20.0000 mg | ORAL_TABLET | Freq: Three times a day (TID) | ORAL | Status: DC | PRN

## 2015-04-19 MED ORDER — PANTOPRAZOLE SODIUM 40 MG IV SOLR
40.0000 mg | Freq: Once | INTRAVENOUS | Status: AC
  Administered 2015-04-19: 40 mg via INTRAVENOUS
  Filled 2015-04-19: qty 40

## 2015-04-19 MED ORDER — DICYCLOMINE HCL 10 MG PO CAPS
20.0000 mg | ORAL_CAPSULE | Freq: Once | ORAL | Status: AC
  Administered 2015-04-19: 20 mg via ORAL
  Filled 2015-04-19: qty 2

## 2015-04-19 MED ORDER — ONDANSETRON HCL 4 MG/2ML IJ SOLN
4.0000 mg | Freq: Once | INTRAMUSCULAR | Status: AC
  Administered 2015-04-19: 4 mg via INTRAVENOUS
  Filled 2015-04-19: qty 2

## 2015-04-19 MED ORDER — TRAMADOL HCL 50 MG PO TABS: 50.0000 mg | ORAL_TABLET | Freq: Four times a day (QID) | ORAL | Status: DC | PRN

## 2015-04-19 MED ORDER — OMEPRAZOLE 40 MG PO CPDR: 40.0000 mg | DELAYED_RELEASE_CAPSULE | Freq: Every day | ORAL | Status: DC

## 2015-04-19 MED ORDER — GI COCKTAIL ~~LOC~~
30.0000 mL | Freq: Once | ORAL | Status: AC
  Administered 2015-04-19: 30 mL via ORAL
  Filled 2015-04-19: qty 30

## 2015-04-19 NOTE — ED Notes (Addendum)
Patient states she was prescribed Naprosyn for "leg cramps" and has since developed nausea, abd pain, and diarrhea. States laying on abdomen provides some relief. Patient anxious and tearful in triage.

## 2015-04-19 NOTE — ED Provider Notes (Signed)
[facility] Emergency Department Provider Note  ____________________________________________  Time seen: Approximately 7:20 PM  I have reviewed the triage vital signs and the nursing notes.   HISTORY  Chief Complaint Abdominal Pain    HPI Nicole Chandler is a 54 y.o. female with chronic bilateral lower extremity muscle aches to his presenting today for upper abdominal pain. She said that her pain started 3 days ago and has been worsening ever since. She describes it as a crampy burning pain that is associated with multiple episodes of diarrhea. No recent course of antibiotics. Had one episode of vomiting 3 days ago. No further episodes of vomiting but is nauseous. Denies any blood in her vomit or stool. Says that she has been taking Naprosyn twice a day over the past 3 months and thinks that this is making her symptoms worse. No dysuria. No history of kidney stones. No back pain at this time.   Past Medical History  Diagnosis Date  . Chronic back pain unk    There are no active problems to display for this patient.   Past Surgical History  Procedure Laterality Date  . Eye surgery    . Abdominal surgery      Current Outpatient Rx  Name  Route  Sig  Dispense  Refill  . gabapentin (NEURONTIN) 600 MG tablet   Oral   Take 600 mg by mouth 2 (two) times daily.           Allergies Codeine  Family History  Problem Relation Age of Onset  . Alzheimer's disease Mother   . AAA (abdominal aortic aneurysm) Father     Social History Social History  Substance Use Topics  . Smoking status: Current Some Day Smoker    Last Attempt to Quit: 08/27/2014  . Smokeless tobacco: None  . Alcohol Use: No    Review of Systems Constitutional: No fever/chills Eyes: No visual changes. ENT: No sore throat. Cardiovascular: Denies chest pain. Respiratory: Denies shortness of breath. Gastrointestinal:  No constipation. Genitourinary: Negative for  dysuria. Musculoskeletal: Negative for back pain. Skin: Negative for rash. Neurological: Negative for headaches, focal weakness or numbness.  10-point ROS otherwise negative.  ____________________________________________   PHYSICAL EXAM:  VITAL SIGNS: ED Triage Vitals  Enc Vitals Group     BP 04/19/15 1803 174/110 mmHg     Pulse Rate 04/19/15 1803 70     Resp 04/19/15 1803 18     Temp 04/19/15 1803 98.5 F (36.9 C)     Temp Source 04/19/15 1803 Oral     SpO2 04/19/15 1803 98 %     Weight 04/19/15 1803 140 lb (63.504 kg)     Height 04/19/15 1803 5\' 2"  (1.575 m)     Head Cir --      Peak Flow --      Pain Score 04/19/15 1804 9     Pain Loc --      Pain Edu? --      Excl. in GC? --     Constitutional: Alert and oriented. Lying on her abdomen went to the room. No acute distress but is tearful while she is telling me her history. Eyes: Conjunctivae are normal. PERRL. EOMI. Head: Atraumatic. Nose: No congestion/rhinnorhea. Mouth/Throat: Mucous membranes are moist.  Oropharynx non-erythematous. Neck: No stridor.   Cardiovascular: Normal rate, regular rhythm. Grossly normal heart sounds.  Good peripheral circulation. Respiratory: Normal respiratory effort.  No retractions. Lungs CTAB. Gastrointestinal: Soft with tenderness across the upper abdomen with  a positive Murphy sign. No tenderness to the lower abdomen. There is no tenderness over McBurney's point.. No distention. No abdominal bruits. No CVA tenderness. Musculoskeletal: No lower extremity tenderness nor edema.  No joint effusions. Neurologic:  Normal speech and language. No gross focal neurologic deficits are appreciated. No gait instability. Skin:  Skin is warm, dry and intact. No rash noted. Psychiatric: Mood and affect are normal. Speech and behavior are normal.  ____________________________________________   LABS (all labs ordered are listed, but only abnormal results are displayed)  Labs Reviewed   COMPREHENSIVE METABOLIC PANEL - Abnormal; Notable for the following:    Creatinine, Ser 1.33 (*)    Total Protein 8.6 (*)    ALT 7 (*)    GFR calc non Af Amer 44 (*)    GFR calc Af Amer 51 (*)    All other components within normal limits  CBC - Abnormal; Notable for the following:    WBC 13.3 (*)    RDW 14.9 (*)    Platelets 466 (*)    All other components within normal limits  URINALYSIS COMPLETEWITH MICROSCOPIC ([facility] ONLY) - Abnormal; Notable for the following:    Color, Urine COLORLESS (*)    APPearance CLEAR (*)    Specific Gravity, Urine 1.002 (*)    Hgb urine dipstick 1+ (*)    All other components within normal limits  LIPASE, BLOOD  TROPONIN I   ____________________________________________  EKG  ED ECG REPORT I, Arelia Longest, the attending physician, personally viewed and interpreted this ECG.   Date: 04/19/2015  EKG Time: 1947  Rate: 58  Rhythm: Sinus bradycardia  Axis: Normal axis  Intervals:none  ST&T Change: No ST elevations or depressions. No abnormal T-wave inversion. Nonspecific ST abnormality likely read on the EKG because of poor baseline in V3.  ____________________________________________  RADIOLOGY  No acute abnormality on chest radiography. I personally viewed these images.  Mild increased caliber of the common bile duct without obstructing stone or mass noted. ____________________________________________   PROCEDURES    ____________________________________________   INITIAL IMPRESSION / ASSESSMENT AND PLAN / ED COURSE  Pertinent labs & imaging results that were available during my care of the patient were reviewed by me and considered in my medical decision making (see chart for details).  ----------------------------------------- 9:57 PM on 04/19/2015 -----------------------------------------  He is resting comfortably at this time and has tolerated by mouth liquids and solids. We discussed the imaging including a mild  increase of the common bile duct. She'll be given follow-up with the gastroenterologist. However, I believe that her presentation tonight is from overuse of Naprosyn causing gastritis. I will give her meds to treat or gastritis. She was no longer take Naprosyn I will give her shortness of tramadol for her leg cramps. ____________________________________________   FINAL CLINICAL IMPRESSION(S) / ED DIAGNOSES  Upper abdominal pain. Gastritis. Initial visit.    Myrna Blazer, MD 04/19/15 2158

## 2015-04-19 NOTE — Discharge Instructions (Signed)
Do not take any more NSAIDs such as ibuprofen and Naprosyn.   Abdominal Pain Many things can cause abdominal pain. Usually, abdominal pain is not caused by a disease and will improve without treatment. It can often be observed and treated at home. Your health care provider will do a physical exam and possibly order blood tests and X-rays to help determine the seriousness of your pain. However, in many cases, more time must pass before a clear cause of the pain can be found. Before that point, your health care provider may not know if you need more testing or further treatment. HOME CARE INSTRUCTIONS  Monitor your abdominal pain for any changes. The following actions may help to alleviate any discomfort you are experiencing:  Only take over-the-counter or prescription medicines as directed by your health care provider.  Do not take laxatives unless directed to do so by your health care provider.  Try a clear liquid diet (broth, tea, or water) as directed by your health care provider. Slowly move to a bland diet as tolerated. SEEK MEDICAL CARE IF:  You have unexplained abdominal pain.  You have abdominal pain associated with nausea or diarrhea.  You have pain when you urinate or have a bowel movement.  You experience abdominal pain that wakes you in the night.  You have abdominal pain that is worsened or improved by eating food.  You have abdominal pain that is worsened with eating fatty foods.  You have a fever. SEEK IMMEDIATE MEDICAL CARE IF:   Your pain does not go away within 2 hours.  You keep throwing up (vomiting).  Your pain is felt only in portions of the abdomen, such as the right side or the left lower portion of the abdomen.  You pass bloody or black tarry stools. MAKE SURE YOU:  Understand these instructions.   Will watch your condition.   Will get help right away if you are not doing well or get worse.  Document Released: 05/21/2005 Document Revised:  08/16/2013 Document Reviewed: 04/20/2013 [facility] Patient Information 2015 [place], [place]. This information is not intended to replace advice given to you by your health care provider. Make sure you discuss any questions you have with your health care provider.  Gastritis, Adult Gastritis is soreness and swelling (inflammation) of the lining of the stomach. Gastritis can develop as a sudden onset (acute) or long-term (chronic) condition. If gastritis is not treated, it can lead to stomach bleeding and ulcers. CAUSES  Gastritis occurs when the stomach lining is weak or damaged. Digestive juices from the stomach then inflame the weakened stomach lining. The stomach lining may be weak or damaged due to viral or bacterial infections. One common bacterial infection is the Helicobacter pylori infection. Gastritis can also result from excessive alcohol consumption, taking certain medicines, or having too much acid in the stomach.  SYMPTOMS  In some cases, there are no symptoms. When symptoms are present, they may include:  Pain or a burning sensation in the upper abdomen.  Nausea.  Vomiting.  An uncomfortable feeling of fullness after eating. DIAGNOSIS  Your caregiver may suspect you have gastritis based on your symptoms and a physical exam. To determine the cause of your gastritis, your caregiver may perform the following:  Blood or stool tests to check for the H pylori bacterium.  Gastroscopy. A thin, flexible tube (endoscope) is passed down the esophagus and into the stomach. The endoscope has a light and camera on the end. Your caregiver uses the endoscope  to view the inside of the stomach.  Taking a tissue sample (biopsy) from the stomach to examine under a microscope. TREATMENT  Depending on the cause of your gastritis, medicines may be prescribed. If you have a bacterial infection, such as an H pylori infection, antibiotics may be given. If your gastritis is caused by too much acid in the  stomach, H2 blockers or antacids may be given. Your caregiver may recommend that you stop taking aspirin, ibuprofen, or other nonsteroidal anti-inflammatory drugs (NSAIDs). HOME CARE INSTRUCTIONS  Only take over-the-counter or prescription medicines as directed by your caregiver.  If you were given antibiotic medicines, take them as directed. Finish them even if you start to feel better.  Drink enough fluids to keep your urine clear or pale yellow.  Avoid foods and drinks that make your symptoms worse, such as:  Caffeine or alcoholic drinks.  Chocolate.  Peppermint or mint flavorings.  Garlic and onions.  Spicy foods.  Citrus fruits, such as oranges, lemons, or limes.  Tomato-based foods such as sauce, chili, salsa, and pizza.  Fried and fatty foods.  Eat small, frequent meals instead of large meals. SEEK IMMEDIATE MEDICAL CARE IF:   You have black or dark red stools.  You vomit blood or material that looks like coffee grounds.  You are unable to keep fluids down.  Your abdominal pain gets worse.  You have a fever.  You do not feel better after 1 week.  You have any other questions or concerns. MAKE SURE YOU:  Understand these instructions.  Will watch your condition.  Will get help right away if you are not doing well or get worse. Document Released: 08/05/2001 Document Revised: 02/10/2012 Document Reviewed: 09/24/2011 [facility] Patient Information 2015 [place], [place]. This information is not intended to replace advice given to you by your health care provider. Make sure you discuss any questions you have with your health care provider.

## 2015-05-11 ENCOUNTER — Emergency Department
Admission: EM | Admit: 2015-05-11 | Discharge: 2015-05-11 | Disposition: A | Discharge status: Home / Self Care | Payer: Self-pay | Attending: Emergency Medicine | Admitting: Emergency Medicine

## 2015-05-11 ENCOUNTER — Encounter: Payer: Self-pay | Admitting: Medical Oncology

## 2015-05-11 DIAGNOSIS — X58XXXA Exposure to other specified factors, initial encounter: Secondary | ICD-10-CM | POA: Insufficient documentation

## 2015-05-11 DIAGNOSIS — Y998 Other external cause status: Secondary | ICD-10-CM | POA: Insufficient documentation

## 2015-05-11 DIAGNOSIS — Z79899 Other long term (current) drug therapy: Secondary | ICD-10-CM | POA: Insufficient documentation

## 2015-05-11 DIAGNOSIS — S39012A Strain of muscle, fascia and tendon of lower back, initial encounter: Secondary | ICD-10-CM | POA: Insufficient documentation

## 2015-05-11 DIAGNOSIS — Z720 Tobacco use: Secondary | ICD-10-CM | POA: Insufficient documentation

## 2015-05-11 DIAGNOSIS — Y9289 Other specified places as the place of occurrence of the external cause: Secondary | ICD-10-CM | POA: Insufficient documentation

## 2015-05-11 DIAGNOSIS — M62838 Other muscle spasm: Secondary | ICD-10-CM | POA: Insufficient documentation

## 2015-05-11 DIAGNOSIS — Y9389 Activity, other specified: Secondary | ICD-10-CM | POA: Insufficient documentation

## 2015-05-11 MED ORDER — CYCLOBENZAPRINE HCL 5 MG PO TABS: 5.0000 mg | ORAL_TABLET | Freq: Three times a day (TID) | ORAL | Status: DC | PRN

## 2015-05-11 MED ORDER — CYCLOBENZAPRINE HCL 10 MG PO TABS
10.0000 mg | ORAL_TABLET | Freq: Once | ORAL | Status: AC
  Administered 2015-05-11: 10 mg via ORAL
  Filled 2015-05-11: qty 1

## 2015-05-11 NOTE — ED Notes (Signed)
Pt reports that she is having muscle spasms from her mid back to her rt hip. Reports hx of the same and was given naproxen.

## 2015-05-11 NOTE — Discharge Instructions (Signed)
Back Pain, Adult Back pain is very common. The pain often gets better over time. The cause of back pain is usually not dangerous. Most people can learn to manage their back pain on their own.  HOME CARE   Stay active. Start with short walks on flat ground if you can. Try to walk farther each day.  Do not sit, drive, or stand in one place for more than 30 minutes. Do not stay in bed.  Do not avoid exercise or work. Activity can help your back heal faster.  Be careful when you bend or lift an object. Bend at your knees, keep the object close to you, and do not twist.  Sleep on a firm mattress. Lie on your side, and bend your knees. If you lie on your back, put a pillow under your knees.  Only take medicines as told by your doctor.  Put ice on the injured area.  Put ice in a plastic bag.  Place a towel between your skin and the bag.  Leave the ice on for 15-20 minutes, 03-04 times a day for the first 2 to 3 days. After that, you can switch between ice and heat packs.  Ask your doctor about back exercises or massage.  Avoid feeling anxious or stressed. Find good ways to deal with stress, such as exercise. GET HELP RIGHT AWAY IF:   Your pain does not go away with rest or medicine.  Your pain does not go away in 1 week.  You have new problems.  You do not feel well.  The pain spreads into your legs.  You cannot control when you poop (bowel movement) or pee (urinate).  Your arms or legs feel weak or lose feeling (numbness).  You feel sick to your stomach (nauseous) or throw up (vomit).  You have belly (abdominal) pain.  You feel like you may pass out (faint). MAKE SURE YOU:   Understand these instructions.  Will watch your condition.  Will get help right away if you are not doing well or get worse. Document Released: 01/28/2008 Document Revised: 11/03/2011 Document Reviewed: 12/13/2013 [facility] Patient Information 2015 [place], [place]. This information is not intended  to replace advice given to you by your health care provider. Make sure you discuss any questions you have with your health care provider.  Muscle Cramps and Spasms Muscle cramps and spasms occur when a muscle or muscles tighten and you have no control over this tightening (involuntary muscle contraction). They are a common problem and can develop in any muscle. The most common place is in the calf muscles of the leg. Both muscle cramps and muscle spasms are involuntary muscle contractions, but they also have differences:   Muscle cramps are sporadic and painful. They may last a few seconds to a quarter of an hour. Muscle cramps are often more forceful and last longer than muscle spasms.  Muscle spasms may or may not be painful. They may also last just a few seconds or much longer. CAUSES  It is uncommon for cramps or spasms to be due to a serious underlying problem. In many cases, the cause of cramps or spasms is unknown. Some common causes are:   Overexertion.   Overuse from repetitive motions (doing the same thing over and over).   Remaining in a certain position for a long period of time.   Improper preparation, form, or technique while performing a sport or activity.   Dehydration.   Injury.   Side effects of  some medicines.   Abnormally low levels of the salts and ions in your blood (electrolytes), especially potassium and calcium. This could happen if you are taking water pills (diuretics) or you are pregnant.  Some underlying medical problems can make it more likely to develop cramps or spasms. These include, but are not limited to:   Diabetes.   Parkinson disease.   Hormone disorders, such as thyroid problems.   Alcohol abuse.   Diseases specific to muscles, joints, and bones.   Blood vessel disease where not enough blood is getting to the muscles.  HOME CARE INSTRUCTIONS   Stay well hydrated. Drink enough water and fluids to keep your urine clear or  pale yellow.  It may be helpful to massage, stretch, and relax the affected muscle.  For tight or tense muscles, use a warm towel, heating pad, or hot shower water directed to the affected area.  If you are sore or have pain after a cramp or spasm, applying ice to the affected area may relieve discomfort.  Put ice in a plastic bag.  Place a towel between your skin and the bag.  Leave the ice on for 15-20 minutes, 03-04 times a day.  Medicines used to treat a known cause of cramps or spasms may help reduce their frequency or severity. Only take over-the-counter or prescription medicines as directed by your caregiver. SEEK MEDICAL CARE IF:  Your cramps or spasms get more severe, more frequent, or do not improve over time.  MAKE SURE YOU:   Understand these instructions.  Will watch your condition.  Will get help right away if you are not doing well or get worse. Document Released: 01/31/2002 Document Revised: 12/06/2012 Document Reviewed: 07/28/2012 [facility] Patient Information 2015 [place], [place]. This information is not intended to replace advice given to you by your health care provider. Make sure you discuss any questions you have with your health care provider.  Take the prescription muscle relaxant as needed for spasms. Do NOT Drive while taking this medicine. Apply ice or moist heat compresses to any sore muscles.  Follow-up with [facility] as needed.

## 2015-05-11 NOTE — ED Provider Notes (Signed)
[facility] Emergency Department [place] Note ____________________________________________  Time seen: 1040  I have reviewed the triage vital signs and the nursing notes.  HISTORY  Chief Complaint  Spasms  HPI Nicole Chandler is a 54 y.o. female who reports to the ED with complaints of muscle spasms and low back and the right gluteal region. She denies any lower extremity weakness, foot drop, or incontinence. She also denies any recent injury or trauma. 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 She rates her pain at 8/10 in triage.  Past Medical History  Diagnosis Date  . Chronic back pain unk   There are no active problems to display for this patient.  Past Surgical History  Procedure Laterality Date  . Eye surgery    . Abdominal surgery      Current Outpatient Rx  Name  Route  Sig  Dispense  Refill  . cyclobenzaprine (FLEXERIL) 5 MG tablet   Oral   Take 1 tablet (5 mg total) by mouth every 8 (eight) hours as needed for muscle spasms.   12 tablet   0   . dicyclomine (BENTYL) 20 MG tablet   Oral   Take 1 tablet (20 mg total) by mouth 3 (three) times daily as needed for spasms.   30 tablet   0   . gabapentin (NEURONTIN) 600 MG tablet   Oral   Take 600 mg by mouth 2 (two) times daily.         Marland Kitchen omeprazole (PRILOSEC) 40 MG capsule   Oral   Take 1 capsule (40 mg total) by mouth daily.   30 capsule   1   . traMADol (ULTRAM) 50 MG tablet   Oral   Take 1 tablet (50 mg total) by mouth every 6 (six) hours as needed.   10 tablet   0    Allergies Review of patient's allergies indicates no known allergies.  Family History  Problem Relation Age of Onset  . Alzheimer's disease Mother   . AAA (abdominal aortic aneurysm) Father    Social History Social History  Substance  Use Topics  . Smoking status: Current Some Day Smoker    Last Attempt to Quit: 08/27/2014  . Smokeless tobacco: None  . Alcohol Use: No   Review of Systems  Constitutional: Negative for fever. Eyes: Negative for visual changes. ENT: Negative for sore throat. Cardiovascular: Negative for chest pain. Respiratory: Negative for shortness of breath. Gastrointestinal: Negative for abdominal pain, vomiting and diarrhea. Genitourinary: Negative for dysuria. Musculoskeletal: Reports back pain & spasms. Skin: Negative for rash. Neurological: Negative for headaches, focal weakness or numbness. ____________________________________________  PHYSICAL EXAM:  VITAL SIGNS: ED Triage Vitals  Enc Vitals Group     BP 05/11/15 0957 128/83 mmHg     Pulse Rate 05/11/15 0957 66     Resp 05/11/15 0957 18     Temp 05/11/15 0957 97.7 F (36.5 C)     Temp Source 05/11/15 0957 Oral     SpO2 05/11/15 0957 97 %     Weight 05/11/15 0957 140 lb (63.504 kg)     Height 05/11/15 0957 5\' 2"  (1.575 m)     Head Cir --      Peak Flow --      Pain Score 05/11/15 0958 8     Pain Loc --  Pain Edu? --      Excl. in GC? --    Constitutional: Alert and oriented. Well appearing and in no distress. Eyes: Conjunctivae are normal. PERRL. Normal extraocular movements. ENT   Head: Normocephalic and atraumatic.   Nose: No congestion/rhinorrhea.   Mouth/Throat: Mucous membranes are moist.   Neck: Supple. No thyromegaly. Hematological/Lymphatic/Immunological: No cervical lymphadenopathy. Cardiovascular: Normal rate, regular rhythm.  Respiratory: Normal respiratory effort. No wheezes/rales/rhonchi. Gastrointestinal: Soft and nontender. No distention. Musculoskeletal: Normal spinal alignment without spasm, deformity, or step-off. Normal full flexion and extension of the lumbar spine. Normal toe and heel raise. Negative straight leg raise. Nontender with normal range of motion in all extremities.   Neurologic:  Cranial nerves II through XII grossly intact. Normal extremity DTRs bilaterally. Normal gait without ataxia. Normal speech and language. No gross focal neurologic deficits are appreciated. Skin:  Skin is warm, dry and intact. No rash noted. Psychiatric: Mood and affect are normal. Patient exhibits appropriate insight and judgment. ____________________________________________  PROCEDURES  Flexeril 10 mg PO ____________________________________________  INITIAL IMPRESSION / ASSESSMENT AND PLAN / ED COURSE  Patient with a history of chronic low back pain with some flare of acute muscle spasms. Patient will be prescribed Flexeril for spasm relief and she is follow-up with [facility] for ongoing symptoms. ____________________________________________  FINAL CLINICAL IMPRESSION(S) / ED DIAGNOSES  Final diagnoses:  Muscle spasm of right lower extremity  Lumbar strain, initial encounter      Lissa Hoard, PA-C 05/11/15 1058  Jeanmarie Plant, MD 05/11/15 [phone]

## 2015-05-18 ENCOUNTER — Other Ambulatory Visit: Payer: Self-pay

## 2015-05-18 ENCOUNTER — Inpatient Hospital Stay: Admission: RE | Admit: 2015-05-18 | Payer: Self-pay | Source: Ambulatory Visit

## 2015-06-28 ENCOUNTER — Emergency Department
Admission: EM | Admit: 2015-06-28 | Discharge: 2015-06-28 | Disposition: A | Discharge status: Home / Self Care | Payer: Self-pay | Attending: Emergency Medicine | Admitting: Emergency Medicine

## 2015-06-28 ENCOUNTER — Encounter: Payer: Self-pay | Admitting: Emergency Medicine

## 2015-06-28 DIAGNOSIS — H9202 Otalgia, left ear: Secondary | ICD-10-CM | POA: Insufficient documentation

## 2015-06-28 DIAGNOSIS — Z79899 Other long term (current) drug therapy: Secondary | ICD-10-CM | POA: Insufficient documentation

## 2015-06-28 DIAGNOSIS — R5381 Other malaise: Secondary | ICD-10-CM | POA: Insufficient documentation

## 2015-06-28 DIAGNOSIS — R51 Headache: Secondary | ICD-10-CM | POA: Insufficient documentation

## 2015-06-28 DIAGNOSIS — Z720 Tobacco use: Secondary | ICD-10-CM | POA: Insufficient documentation

## 2015-06-28 DIAGNOSIS — J209 Acute bronchitis, unspecified: Secondary | ICD-10-CM | POA: Insufficient documentation

## 2015-06-28 MED ORDER — AMOXICILLIN 500 MG PO CAPS
500.0000 mg | ORAL_CAPSULE | Freq: Once | ORAL | Status: AC
  Administered 2015-06-28: 500 mg via ORAL
  Filled 2015-06-28: qty 1

## 2015-06-28 MED ORDER — PREDNISONE 10 MG (21) PO TBPK: ORAL_TABLET | ORAL | Status: DC

## 2015-06-28 MED ORDER — PREDNISONE 20 MG PO TABS
60.0000 mg | ORAL_TABLET | Freq: Once | ORAL | Status: AC
  Administered 2015-06-28: 60 mg via ORAL
  Filled 2015-06-28: qty 3

## 2015-06-28 MED ORDER — HYDROCODONE-HOMATROPINE 5-1.5 MG/5ML PO SYRP: 5.0000 mL | ORAL_SOLUTION | Freq: Four times a day (QID) | ORAL | Status: DC | PRN

## 2015-06-28 MED ORDER — AMOXICILLIN 500 MG PO TABS: 500.0000 mg | ORAL_TABLET | Freq: Three times a day (TID) | ORAL | Status: DC

## 2015-06-28 NOTE — ED Notes (Signed)
Pt states she started having "flu like s/s" pt states her head hurts, ear is throbbing, coughing, nauseated

## 2015-06-28 NOTE — ED Provider Notes (Signed)
[facility] Emergency Department Provider Note  ____________________________________________  Time seen: Approximately 3:38 PM  I have reviewed the triage vital signs and the nursing notes.   HISTORY  Chief Complaint Headache    HPI Nicole Chandler is a 54 y.o. female with history of tobacco abuse who presents with 3 days of worsening cough, congestion and chest tightness. She also has some headache and dizziness;  And complaints of ear pain, left.  Denies hx of pneumonia.  She has general malaise.   Past Medical History  Diagnosis Date  . Chronic back pain unk    There are no active problems to display for this patient.   Past Surgical History  Procedure Laterality Date  . Eye surgery    . Abdominal surgery      Current Outpatient Rx  Name  Route  Sig  Dispense  Refill  . amoxicillin (AMOXIL) 500 MG tablet   Oral   Take 1 tablet (500 mg total) by mouth 3 (three) times daily.   30 tablet   0   . cyclobenzaprine (FLEXERIL) 5 MG tablet   Oral   Take 1 tablet (5 mg total) by mouth every 8 (eight) hours as needed for muscle spasms.   12 tablet   0   . dicyclomine (BENTYL) 20 MG tablet   Oral   Take 1 tablet (20 mg total) by mouth 3 (three) times daily as needed for spasms.   30 tablet   0   . gabapentin (NEURONTIN) 600 MG tablet   Oral   Take 600 mg by mouth 2 (two) times daily.         Marland Kitchen. HYDROcodone-homatropine (HYCODAN) 5-1.5 MG/5ML syrup   Oral   Take 5 mLs by mouth every 6 (six) hours as needed for cough.   120 mL   0   . omeprazole (PRILOSEC) 40 MG capsule   Oral   Take 1 capsule (40 mg total) by mouth daily.   30 capsule   1   . predniSONE (STERAPRED UNI-PAK 21 TAB) 10 MG (21) TBPK tablet      6 tablets on day 1, 5 tablets on day 2, 4 tablets on day 3, etc...   21 tablet   0   . traMADol (ULTRAM) 50 MG tablet   Oral   Take 1 tablet (50 mg total) by mouth every 6 (six) hours as needed.   10 tablet   0      Allergies Review of patient's allergies indicates no known allergies.  Family History  Problem Relation Age of Onset  . Alzheimer's disease Mother   . AAA (abdominal aortic aneurysm) Father     Social History Social History  Substance Use Topics  . Smoking status: Current Some Day Smoker -- 0.50 packs/day    Last Attempt to Quit: 08/27/2014  . Smokeless tobacco: None  . Alcohol Use: No    Review of Systems Constitutional: per hpi Eyes: No visual changes. ENT: No sore throat. Cardiovascular: Denies chest pain, but has chest tightness. Respiratory: some wheezing and shortness of breath. Gastrointestinal: No abdominal pain.  No nausea, no vomiting.  No diarrhea.  No constipation. Neurological: positive for headaches, focal weakness or numbness.  10-point ROS otherwise negative.  ____________________________________________   PHYSICAL EXAM:  VITAL SIGNS: ED Triage Vitals  Enc Vitals Group     BP 06/28/15 1508 120/67 mmHg     Pulse Rate 06/28/15 1508 72     Resp 06/28/15 1508 18  Temp 06/28/15 1508 97.6 F (36.4 C)     Temp Source 06/28/15 1508 Oral     SpO2 06/28/15 1508 96 %     Weight 06/28/15 1508 140 lb (63.504 kg)     Height 06/28/15 1508  (1.575 m)     Head Cir --      Peak Flow --      Pain Score 06/28/15 1508 7     Pain Loc --      Pain Edu? --      Excl. in GC? --     Constitutional: Alert and oriented. Well appearing and in no acute distress. Eyes: Conjunctivae are normal. PERRL. EOMI. Head: Atraumatic. Nose: No congestion/rhinnorhea. Mouth/Throat: Mucous membranes are moist.  Oropharynx is erythematous. Neck: No stridor.  supple Cardiovascular: Normal rate, regular rhythm. Grossly normal heart sounds.  Good peripheral circulation. Respiratory: Normal respiratory effort.  No retractions. Wheezing with few rhonchi bilateral Musculoskeletal: No lower extremity tenderness nor edema.  No joint effusions. Neurologic:  Normal speech and  language. No gross focal neurologic deficits are appreciated. No gait instability. Skin:  Skin is warm, dry and intact. No rash noted. Psychiatric: Mood and affect are normal. Speech and behavior are normal.  ____________________________________________   LABS (all labs ordered are listed, but only abnormal results are displayed)  Labs Reviewed - No data to display ____________________________________________  EKG    ____________________________________________  RADIOLOGY    ____________________________________________   PROCEDURES  Procedure(s) performed: None  Critical Care performed: No  ____________________________________________   INITIAL IMPRESSION / ASSESSMENT AND PLAN / ED COURSE  Pertinent labs & imaging results that were available during my care of the patient were reviewed by me and considered in my medical decision making (see chart for details).  54 year old female with acute bronchitis and bronchospasms.. She is given prednisone, and hycodan.   With higher risk of super infection, will add amoxicillin.  She will return for any worsening symptoms. ____________________________________________   FINAL CLINICAL IMPRESSION(S) / ED DIAGNOSES  Final diagnoses:  Bronchitis, acute, with bronchospasm      Ignacia Bayley, PA-C 06/28/15 1545  Governor Rooks, MD 06/29/15 [phone]

## 2015-06-28 NOTE — Discharge Instructions (Signed)
Acute Bronchitis Bronchitis is inflammation of the airways that extend from the windpipe into the lungs (bronchi). The inflammation often causes mucus to develop. This leads to a cough, which is the most common symptom of bronchitis.  In acute bronchitis, the condition usually develops suddenly and goes away over time, usually in a couple weeks. Smoking, allergies, and asthma can make bronchitis worse. Repeated episodes of bronchitis may cause further lung problems.  CAUSES Acute bronchitis is most often caused by the same virus that causes a cold. The virus can spread from person to person (contagious) through coughing, sneezing, and touching contaminated objects. SIGNS AND SYMPTOMS   Cough.   Fever.   Coughing up mucus.   Body aches.   Chest congestion.   Chills.   Shortness of breath.   Sore throat.  DIAGNOSIS  Acute bronchitis is usually diagnosed through a physical exam. Your health care provider will also ask you questions about your medical history. Tests, such as chest X-rays, are sometimes done to rule out other conditions.  TREATMENT  Acute bronchitis usually goes away in a couple weeks. Oftentimes, no medical treatment is necessary. Medicines are sometimes given for relief of fever or cough. Antibiotic medicines are usually not needed but may be prescribed in certain situations. In some cases, an inhaler may be recommended to help reduce shortness of breath and control the cough. A cool mist vaporizer may also be used to help thin bronchial secretions and make it easier to clear the chest.  HOME CARE INSTRUCTIONS  Get plenty of rest.   Drink enough fluids to keep your urine clear or pale yellow (unless you have a medical condition that requires fluid restriction). Increasing fluids may help thin your respiratory secretions (sputum) and reduce chest congestion, and it will prevent dehydration.   Take medicines only as directed by your health care provider.  If  you were prescribed an antibiotic medicine, finish it all even if you start to feel better.  Avoid smoking and secondhand smoke. Exposure to cigarette smoke or irritating chemicals will make bronchitis worse. If you are a smoker, consider using nicotine gum or skin patches to help control withdrawal symptoms. Quitting smoking will help your lungs heal faster.   Reduce the chances of another bout of acute bronchitis by washing your hands frequently, avoiding people with cold symptoms, and trying not to touch your hands to your mouth, nose, or eyes.   Keep all follow-up visits as directed by your health care provider.  SEEK MEDICAL CARE IF: Your symptoms do not improve after 1 week of treatment.  SEEK IMMEDIATE MEDICAL CARE IF:  You develop an increased fever or chills.   You have chest pain.   You have severe shortness of breath.  You have bloody sputum.   You develop dehydration.  You faint or repeatedly feel like you are going to pass out.  You develop repeated vomiting.  You develop a severe headache. MAKE SURE YOU:   Understand these instructions.  Will watch your condition.  Will get help right away if you are not doing well or get worse.   This information is not intended to replace advice given to you by your health care provider. Make sure you discuss any questions you have with your health care provider.   Document Released: 09/18/2004 Document Revised: 09/01/2014 Document Reviewed: 02/01/2013 Elsevier Interactive Patient Education 2016 Elsevier Inc.   Take antibiotics and prednisone as directed and use cough medicine as needed.  Returne to the emergency  room for any concerns, or worsening symptoms.

## 2015-07-31 ENCOUNTER — Emergency Department
Admission: EM | Admit: 2015-07-31 | Discharge: 2015-07-31 | Disposition: A | Discharge status: Home / Self Care | Payer: Self-pay | Attending: Emergency Medicine | Admitting: Emergency Medicine

## 2015-07-31 ENCOUNTER — Encounter: Payer: Self-pay | Admitting: Medical Oncology

## 2015-07-31 ENCOUNTER — Emergency Department: Payer: Self-pay

## 2015-07-31 DIAGNOSIS — F17200 Nicotine dependence, unspecified, uncomplicated: Secondary | ICD-10-CM | POA: Insufficient documentation

## 2015-07-31 DIAGNOSIS — Z792 Long term (current) use of antibiotics: Secondary | ICD-10-CM | POA: Insufficient documentation

## 2015-07-31 DIAGNOSIS — Z79899 Other long term (current) drug therapy: Secondary | ICD-10-CM | POA: Insufficient documentation

## 2015-07-31 DIAGNOSIS — G8929 Other chronic pain: Secondary | ICD-10-CM | POA: Insufficient documentation

## 2015-07-31 DIAGNOSIS — M5442 Lumbago with sciatica, left side: Secondary | ICD-10-CM | POA: Insufficient documentation

## 2015-07-31 MED ORDER — PREDNISONE 10 MG PO TABS: ORAL_TABLET | ORAL | Status: DC

## 2015-07-31 MED ORDER — KETOROLAC TROMETHAMINE 60 MG/2ML IM SOLN
60.0000 mg | Freq: Once | INTRAMUSCULAR | Status: AC
  Administered 2015-07-31: 60 mg via INTRAMUSCULAR
  Filled 2015-07-31: qty 2

## 2015-07-31 NOTE — ED Provider Notes (Signed)
[facility] Emergency Department Provider Note  ____________________________________________  Time seen: Approximately 4:01 PM  I have reviewed the triage vital signs and the nursing notes.   HISTORY  Chief Complaint Back Pain   HPI Nicole Chandler is a 54 y.o. female here today with complaint of low back pain for 3 days. Patient states that she has a history of chronic back pain and has been seen here in the emergency room multiple times. Today she complains of low back pain with radiation over to her left hip and down her leg. She denies any bowel or bladder incontinence. She is not taking any over-the-counter medication at home for her pain. She states she always gets medicine in the emergency room but has never gotten complete relief from her pain. She states that she is unaware of any referral from the emergency room to her orthopedist and has not been seen by one. She denies any urinary symptoms. She continues to be ambulatory without assistance despite her pain. Currently she rates her pain as 9 out of 10.   Past Medical History  Diagnosis Date  . Chronic back pain unk    There are no active problems to display for this patient.   Past Surgical History  Procedure Laterality Date  . Eye surgery    . Abdominal surgery      Current Outpatient Rx  Name  Route  Sig  Dispense  Refill  . amoxicillin (AMOXIL) 500 MG tablet   Oral   Take 1 tablet (500 mg total) by mouth 3 (three) times daily.   30 tablet   0   . cyclobenzaprine (FLEXERIL) 5 MG tablet   Oral   Take 1 tablet (5 mg total) by mouth every 8 (eight) hours as needed for muscle spasms.   12 tablet   0   . dicyclomine (BENTYL) 20 MG tablet   Oral   Take 1 tablet (20 mg total) by mouth 3 (three) times daily as needed for spasms.   30 tablet   0   . gabapentin (NEURONTIN) 600 MG tablet   Oral   Take 600 mg by mouth 2 (two) times daily.         Marland Kitchen. HYDROcodone-homatropine (HYCODAN)  5-1.5 MG/5ML syrup   Oral   Take 5 mLs by mouth every 6 (six) hours as needed for cough.   120 mL   0   . omeprazole (PRILOSEC) 40 MG capsule   Oral   Take 1 capsule (40 mg total) by mouth daily.   30 capsule   1   . predniSONE (DELTASONE) 10 MG tablet      Take 6 tablets  today, on day 2 take 5 tablets, day 3 take 4 tablets, day 4 take 3 tablets, day 5 take  2 tablets and 1 tablet the last day   21 tablet   0   . traMADol (ULTRAM) 50 MG tablet   Oral   Take 1 tablet (50 mg total) by mouth every 6 (six) hours as needed.   10 tablet   0     Allergies Review of patient's allergies indicates no known allergies.  Family History  Problem Relation Age of Onset  . Alzheimer's disease Mother   . AAA (abdominal aortic aneurysm) Father     Social History Social History  Substance Use Topics  . Smoking status: Current Some Day Smoker -- 0.50 packs/day    Last Attempt to Quit: 08/27/2014  . Smokeless tobacco:  None  . Alcohol Use: No    Review of Systems Constitutional: No fever/chills Eyes: No visual changes. Cardiovascular: Denies chest pain. Respiratory: Denies shortness of breath. Gastrointestinal: No abdominal pain.  No nausea, no vomiting.  Genitourinary: Negative for dysuria. Musculoskeletal: Positive for back pain. Skin: Negative for rash. Neurological: Negative for headaches, focal weakness. Positive left lower extremity paresthesias  10-point ROS otherwise negative.  ____________________________________________   PHYSICAL EXAM:  VITAL SIGNS: ED Triage Vitals  Enc Vitals Group     BP 07/31/15 1553 109/80 mmHg     Pulse Rate 07/31/15 1553 80     Resp 07/31/15 1553 18     Temp 07/31/15 1553 98.4 F (36.9 C)     Temp Source 07/31/15 1553 Oral     SpO2 07/31/15 1553 96 %     Weight 07/31/15 1553 140 lb (63.504 kg)     Height 07/31/15 1553  (1.575 m)     Head Cir --      Peak Flow --      Pain Score 07/31/15 1554 9     Pain Loc --      Pain  Edu? --      Excl. in GC? --     Constitutional: Alert and oriented. Well appearing and in no acute distress. Eyes: Conjunctivae are normal. PERRL. EOMI. Head: Atraumatic. Nose: No congestion/rhinnorhea. Neck: No stridor.   Cardiovascular: Normal rate, regular rhythm. Grossly normal heart sounds.  Good peripheral circulation. Respiratory: Normal respiratory effort.  No retractions. Lungs CTAB. Gastrointestinal: Soft and nontender. No distention.  Musculoskeletal: Examination of the back feels show any gross abnormality. There is marked tenderness on palpation of the sacral and left hip posteriorly. Range of motion is guarded secondary to discomfort. No active muscle spasms are seen. A leg raises were 90 with some discomfort on the left side. Neurologic:  Normal speech and language. No gross focal neurologic deficits are appreciated. No gait instability. Reflexes were 2+ bilaterally. Skin:  Skin is warm, dry and intact. No rash noted. Psychiatric: Mood and affect are normal. Speech and behavior are normal.  ____________________________________________   LABS (all labs ordered are listed, but only abnormal results are displayed)  Labs Reviewed - No data to display  RADIOLOGY  Lumbar x-Chandler per radiologist showed no acute changes. ____________________________________________   PROCEDURES  Procedure(s) performed: None  Critical Care performed: No  ____________________________________________   INITIAL IMPRESSION / ASSESSMENT AND PLAN / ED COURSE  Pertinent labs & imaging results that were available during my care of the patient were reviewed by me and considered in my medical decision making (see chart for details).  Patient was given a prescription for prednisone 6 day taper and information about orthopedist referral. Patient is call Dr. Binnie Rail office for an appointment for her chronic back pain. ____________________________________________   FINAL CLINICAL IMPRESSION(S)  / ED DIAGNOSES  Final diagnoses:  Chronic left-sided low back pain with left-sided sciatica      Tommi Rumps, PA-C 07/31/15 1822  Emily Filbert, MD 08/01/15 [phone]

## 2015-07-31 NOTE — Discharge Instructions (Signed)
Chronic Back Pain  When back pain lasts longer than 3 months, it is called chronic back pain.People with chronic back pain often go through certain periods that are more intense (flare-ups).  CAUSES Chronic back pain can be caused by wear and tear (degeneration) on different structures in your back. These structures include:  The bones of your spine (vertebrae) and the joints surrounding your spinal cord and nerve roots (facets).  The strong, fibrous tissues that connect your vertebrae (ligaments). Degeneration of these structures may result in pressure on your nerves. This can lead to constant pain. HOME CARE INSTRUCTIONS  Avoid bending, heavy lifting, prolonged sitting, and activities which make the problem worse.  Take brief periods of rest throughout the day to reduce your pain. Lying down or standing usually is better than sitting while you are resting.  Take over-the-counter or prescription medicines only as directed by your caregiver. SEEK IMMEDIATE MEDICAL CARE IF:   You have weakness or numbness in one of your legs or feet.  You have trouble controlling your bladder or bowels.  You have nausea, vomiting, abdominal pain, shortness of breath, or fainting.   This information is not intended to replace advice given to you by your health care provider. Make sure you discuss any questions you have with your health care provider.   Document Released: 09/18/2004 Document Revised: 11/03/2011 Document Reviewed: 01/29/2015 Elsevier Interactive Patient Education [employer]2016 Elsevier Inc.   Follow-up with Dr. Joice LoftsPoggi if any continued problems. Take prednisone as directed.

## 2015-07-31 NOTE — ED Notes (Signed)
Pt reports that she has chronic back problems but 3 days ago she began having worsening lower back pain with radiation of pain to left hip and down leg.
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
## 2015-11-28 ENCOUNTER — Ambulatory Visit: Payer: Self-pay

## 2015-12-17 ENCOUNTER — Encounter: Payer: Self-pay | Admitting: *Deleted

## 2015-12-17 ENCOUNTER — Ambulatory Visit
Admission: RE | Admit: 2015-12-17 | Discharge: 2015-12-17 | Disposition: A | Discharge status: Home / Self Care | Payer: Self-pay | Source: Ambulatory Visit | Attending: Oncology | Admitting: Oncology

## 2015-12-17 ENCOUNTER — Ambulatory Visit: Payer: Self-pay | Attending: Oncology | Admitting: *Deleted

## 2015-12-17 VITALS — BP 123/87 | HR 53 | Temp 98.0°F | Resp 16 | Ht 63.39 in | Wt 136.8 lb

## 2015-12-17 DIAGNOSIS — N63 Unspecified lump in unspecified breast: Secondary | ICD-10-CM

## 2015-12-17 NOTE — Progress Notes (Signed)
Subjective:     Patient ID: Meredeth Ideosalind M Ray, female   DOB: December 01, 1960, 55 y.o.   MRN: [id number]  HPI   Review of Systems     Objective:   Physical Exam  Pulmonary/Chest: Right breast exhibits no inverted nipple, no mass, no nipple discharge, no skin change and no tenderness. Left breast exhibits no inverted nipple, no mass, no nipple discharge, no skin change and no tenderness. Breasts are symmetrical.       Assessment:     55 year [facility] White [facility]  Patient did not return for her six month f/u mammogram of a left breast nodule.  Clinical breast exam without dominant mass, skin changes, nipple discharge, or lymphadenopathy.  Taught self breast awareness.  Patients sister with history of breast cancer.   Patient has been screened for eligibility.  She does not have any insurance, Medicare or Medicaid.  She also meets financial eligibility.  Hand-out given on the Affordable Care Act.    Plan:     Will get bilateral diagnostic mammogram and ultrasound for continuity of care.  Reminded patient that her next pap will be due in 4 years.  Will follow-up per [facility] protocol.

## 2015-12-17 NOTE — Patient Instructions (Signed)
Gave patient hand-out, Women Staying Healthy, Active and Well from [facility], with education on breast health, pap smears, heart and colon health. 

## 2015-12-19 ENCOUNTER — Encounter: Payer: Self-pay | Admitting: *Deleted

## 2015-12-19 NOTE — Progress Notes (Signed)
Patient with stable left breast mass.  Mammo results of birads 3, recommending follow up in one year.  Mailed patient a letter with next appointment for December 17, 2016 @ 8:00.  [facility] to [place].

## 2015-12-26 ENCOUNTER — Emergency Department
Admission: EM | Admit: 2015-12-26 | Discharge: 2015-12-27 | Disposition: A | Discharge status: Home / Self Care | Payer: Self-pay | Attending: Emergency Medicine | Admitting: Emergency Medicine

## 2015-12-26 ENCOUNTER — Encounter: Payer: Self-pay | Admitting: Emergency Medicine

## 2015-12-26 DIAGNOSIS — S1096XA Insect bite of unspecified part of neck, initial encounter: Secondary | ICD-10-CM | POA: Insufficient documentation

## 2015-12-26 DIAGNOSIS — W57XXXA Bitten or stung by nonvenomous insect and other nonvenomous arthropods, initial encounter: Secondary | ICD-10-CM | POA: Insufficient documentation

## 2015-12-26 DIAGNOSIS — Y999 Unspecified external cause status: Secondary | ICD-10-CM | POA: Insufficient documentation

## 2015-12-26 DIAGNOSIS — F17200 Nicotine dependence, unspecified, uncomplicated: Secondary | ICD-10-CM | POA: Insufficient documentation

## 2015-12-26 DIAGNOSIS — Y939 Activity, unspecified: Secondary | ICD-10-CM | POA: Insufficient documentation

## 2015-12-26 DIAGNOSIS — Z79899 Other long term (current) drug therapy: Secondary | ICD-10-CM | POA: Insufficient documentation

## 2015-12-26 DIAGNOSIS — Y929 Unspecified place or not applicable: Secondary | ICD-10-CM | POA: Insufficient documentation

## 2015-12-26 MED ORDER — HYDROXYZINE HCL 10 MG PO TABS
10.0000 mg | ORAL_TABLET | Freq: Once | ORAL | Status: AC
  Administered 2015-12-26: 10 mg via ORAL
  Filled 2015-12-26: qty 1

## 2015-12-26 NOTE — ED Notes (Signed)
C/O rash and itching to scalp x 3 weeks.

## 2015-12-26 NOTE — ED Provider Notes (Signed)
[facility] Emergency Department Provider Note  ____________________________________________  Time seen: 11:20 PM  I have reviewed the triage vital signs and the nursing notes.   HISTORY  Chief Complaint Rash     HPI Nicole Chandler is a 55 y.o. female presents with generalized itching and rash onto her scalp 3 weeks. Patient also admits to a tick embedded on the posterior portion of her neck times a few days. Patient noted to be tearful by staff and admits to feeling depressed secondary to multiple stressors. Patient states that she has a history of Schizophrenia but has been off medication for many years. Patient denies any suicidal or homicidal ideation. Patient denies any visual auditory hallucinations.     Past Medical History  Diagnosis Date  . Chronic back pain unk    There are no active problems to display for this patient.   Past Surgical History  Procedure Laterality Date  . Eye surgery    . Abdominal surgery      Current Outpatient Rx  Name  Route  Sig  Dispense  Refill  . BIOTIN PO   Oral   Take 1 tablet by mouth daily.         . Multiple Vitamins-Minerals (MULTIVITAMIN WITH MINERALS) tablet   Oral   Take 1 tablet by mouth daily.           Allergies No known drug allergies  Family History  Problem Relation Age of Onset  . Alzheimer's disease Mother   . AAA (abdominal aortic aneurysm) Father   . Breast cancer Sister [id number]    Social History Social History  Substance Use Topics  . Smoking status: Current Some Day Smoker -- 0.50 packs/day    Last Attempt to Quit: 08/27/2014  . Smokeless tobacco: None  . Alcohol Use: No    Review of Systems  Constitutional: Negative for fever. Eyes: Negative for visual changes. ENT: Negative for sore throat. Cardiovascular: Negative for chest pain. Respiratory: Negative for shortness of breath. Gastrointestinal: Negative for abdominal pain, vomiting and diarrhea. Genitourinary:  Negative for dysuria. Musculoskeletal: Negative for back pain. Skin: Positive for scalp rash, positive for tick on posterior neck Neurological: Negative for headaches, focal weakness or numbness.   10-point ROS otherwise negative.  ____________________________________________   PHYSICAL EXAM:  VITAL SIGNS: ED Triage Vitals  Enc Vitals Group     BP 12/26/15 2052 148/89 mmHg     Pulse Rate 12/26/15 2052 70     Resp 12/26/15 2052 16     Temp 12/26/15 2052 98.4 F (36.9 C)     Temp Source 12/26/15 2052 Oral     SpO2 12/26/15 2052 96 %     Weight 12/26/15 2052 140 lb (63.504 kg)     Height 12/26/15 2052 5\' 2"  (1.575 m)     Head Cir --      Peak Flow --      Pain Score 12/26/15 2053 8     Pain Loc --      Pain Edu? --      Excl. in GC? --     Constitutional: Alert and oriented. Well appearing and in no distress. Eyes: Conjunctivae are normal. PERRL. Normal extraocular movements. ENT   Head: Normocephalic and atraumatic.   Nose: No congestion/rhinnorhea.   Mouth/Throat: Mucous membranes are moist.   Neck: No stridor. Hematological/Lymphatic/Immunilogical: No cervical lymphadenopathy. Cardiovascular: Normal rate, regular rhythm. Normal and symmetric distal pulses are present in all extremities. No murmurs, rubs, or gallops.  Respiratory: Normal respiratory effort without tachypnea nor retractions. Breath sounds are clear and equal bilaterally. No wheezes/rales/rhonchi. Gastrointestinal: Soft and nontender. No distention. There is no CVA tenderness. Genitourinary: deferred Musculoskeletal: Nontender with normal range of motion in all extremities. No joint effusions.  No lower extremity tenderness nor edema. Neurologic:  Normal speech and language. No gross focal neurologic deficits are appreciated. Speech is normal.  Skin: Multiple areas of scalp excoriation noted no rash identified. Embedded tic noted on the posterior portion of the patient's neck Psychiatric: Mood  and affect are normal. Speech and behavior are normal. Patient exhibits appropriate insight and judgment.  Procedure note: Marland Kitchen Tick was removed from the posterior portion of the patient's neck with forceps and totality.   INITIAL IMPRESSION / ASSESSMENT AND PLAN / ED COURSE  Pertinent labs & imaging results that were available during my care of the patient were reviewed by me and considered in my medical decision making (see chart for details).  Given noncompliance with medications for schizophrenia admissions to feeling depressed I offered the patient to be evaluated by the psychiatrist however she stated that she would return for evaluation in the morning. Patient has no suicidal or homicidal ideations at this time. Patient does not have any auditory or visual hallucinations.  ____________________________________________   FINAL CLINICAL IMPRESSION(S) / ED DIAGNOSES  Final diagnoses:  Tick bite of neck, initial encounter      Darci Current, MD 12/27/15 [phone]

## 2015-12-26 NOTE — ED Notes (Signed)
Pt is constantly scratching her head and crying - she repeatedly looks through her back pack and views pictures stating that she has nothing to be proud of

## 2015-12-26 NOTE — ED Notes (Signed)
Pt reports that she has a rash on her head that she has had for the last 3 weeks - she states that it hurts and she is unable to stop scratching her head - pt has a history of panic attacks and anxiety - pt reports that she had has a lot of death in her family lately - spouse died 3 years ago and she cannot let it go - last year her mother died and so did her brother [place]

## 2015-12-26 NOTE — ED Notes (Signed)
Pharmacy called to send Atarax

## 2015-12-27 ENCOUNTER — Emergency Department
Admission: EM | Admit: 2015-12-27 | Discharge: 2015-12-27 | Disposition: A | Discharge status: Home / Self Care | Payer: Self-pay | Attending: Emergency Medicine | Admitting: Emergency Medicine

## 2015-12-27 DIAGNOSIS — L299 Pruritus, unspecified: Secondary | ICD-10-CM

## 2015-12-27 DIAGNOSIS — F17200 Nicotine dependence, unspecified, uncomplicated: Secondary | ICD-10-CM | POA: Insufficient documentation

## 2015-12-27 DIAGNOSIS — L298 Other pruritus: Secondary | ICD-10-CM | POA: Insufficient documentation

## 2015-12-27 MED ORDER — HYDROXYZINE HCL 50 MG PO TABS
50.0000 mg | ORAL_TABLET | Freq: Once | ORAL | Status: AC
  Administered 2015-12-27: 50 mg via ORAL
  Filled 2015-12-27: qty 1

## 2015-12-27 MED ORDER — HALOPERIDOL 5 MG PO TABS
ORAL_TABLET | ORAL | Status: AC
  Administered 2015-12-27: 5 mg
  Filled 2015-12-27: qty 1

## 2015-12-27 MED ORDER — HYDROXYZINE HCL 50 MG PO TABS
50.0000 mg | ORAL_TABLET | Freq: Three times a day (TID) | ORAL | Status: DC | PRN
Start: 2015-12-27 — End: 2016-02-18

## 2015-12-27 NOTE — Progress Notes (Signed)
This writer staffed this patient with [place], [place] it is recommended to refer for inpatient hospitalization.      Jemiah Ellenburg, MSW, LCSW, LCAS [facility] Triage Specialist [phone] 

## 2015-12-27 NOTE — ED Notes (Signed)
See triage note. States she just left this am conts to have scalp itching.

## 2015-12-27 NOTE — Discharge Instructions (Signed)
Follow up with mental health provider: [facility]  [address], [place], [place] [address] Phone: [phone]  Tick Bite Information Ticks are insects that attach themselves to the skin and draw blood for food. There are various types of ticks. Common types include wood ticks and deer ticks. Most ticks live in shrubs and grassy areas. Ticks can climb onto your body when you make contact with leaves or grass where the tick is waiting. The most common places on the body for ticks to attach themselves are the scalp, neck, armpits, waist, and groin. Most tick bites are harmless, but sometimes ticks carry germs that cause diseases. These germs can be spread to a person during the tick's feeding process. The chance of a disease spreading through a tick bite depends on:   The type of tick.  Time of year.   How long the tick is attached.   Geographic location.  HOW CAN YOU PREVENT TICK BITES? Take these steps to help prevent tick bites when you are outdoors:  Wear protective clothing. Long sleeves and long pants are best.   Wear white clothes so you can see ticks more easily.  Tuck your pant legs into your socks.   If walking on a trail, stay in the middle of the trail to avoid brushing against bushes.  Avoid walking through areas with long grass.  Put insect repellent on all exposed skin and along boot tops, pant legs, and sleeve cuffs.   Check clothing, hair, and skin repeatedly and before going inside.   Brush off any ticks that are not attached.  Take a shower or bath as soon as possible after being outdoors.  WHAT IS THE PROPER WAY TO REMOVE A TICK? Ticks should be removed as soon as possible to help prevent diseases caused by tick bites. 1. If latex gloves are available, put them on before trying to remove a tick.  2. Using fine-point tweezers, grasp the tick as close to the skin as possible. You may also use curved forceps or a tick removal tool. Grasp the tick as close  to its head as possible. Avoid grasping the tick on its body. 3. Pull gently with steady upward pressure until the tick lets go. Do not twist the tick or jerk it suddenly. This may break off the tick's head or mouth parts. 4. Do not squeeze or crush the tick's body. This could force disease-carrying fluids from the tick into your body.  5. After the tick is removed, wash the bite area and your hands with soap and water or other disinfectant such as alcohol. 6. Apply a small amount of antiseptic cream or ointment to the bite site.  7. Wash and disinfect any instruments that were used.  Do not try to remove a tick by applying a hot match, petroleum jelly, or fingernail polish to the tick. These methods do not work and may increase the chances of disease being spread from the tick bite.  WHEN SHOULD YOU SEEK MEDICAL CARE? Contact your health care provider if you are unable to remove a tick from your skin or if a part of the tick breaks off and is stuck in the skin.  After a tick bite, you need to be aware of signs and symptoms that could be related to diseases spread by ticks. Contact your health care provider if you develop any of the following in the days or weeks after the tick bite:  Unexplained fever.  Rash. A circular rash that  appears days or weeks after the tick bite may indicate the possibility of Lyme disease. The rash may resemble a target with a bull's-eye and may occur at a different part of your body than the tick bite.  Redness and swelling in the area of the tick bite.   Tender, swollen lymph glands.   Diarrhea.   Weight loss.   Cough.   Fatigue.   Muscle, joint, or bone pain.   Abdominal pain.   Headache.   Lethargy or a change in your level of consciousness.  Difficulty walking or moving your legs.   Numbness in the legs.   Paralysis.  Shortness of breath.   Confusion.   Repeated vomiting.    This information is not intended to replace  advice given to you by your health care provider. Make sure you discuss any questions you have with your health care provider.   Document Released: 08/08/2000 Document Revised: 09/01/2014 Document Reviewed: 01/19/2013 Elsevier Interactive Patient Education [employer]

## 2015-12-27 NOTE — ED Provider Notes (Signed)
[facility] Provider Note   ____________________________________________  Time seen: Approximately 8:45 AM  I have reviewed the triage vital signs and the nursing notes.   HISTORY  Chief Complaint Rash    HPI Nicole [facility] is a 55 y.o. female patient is requesting medication for scalp itching. Patient seen last night at this ED was given Atarax which seems to decrease the itching. Patient was not given a prescription for this medication request 1 today. Patient state she thought she had an appointment at at this ED's morning [facility] for depression. Patient rates her pain discomfort as a 6/10. No palliative measures complaint.  Past Medical History  Diagnosis Date  . Chronic back pain unk    There are no active problems to display for this patient.   Past Surgical History  Procedure Laterality Date  . Eye surgery    . Abdominal surgery      Current Outpatient Rx  Name  Route  Sig  Dispense  Refill  . BIOTIN PO   Oral   Take 1 tablet by mouth daily.         . hydrOXYzine (ATARAX/VISTARIL) 50 MG tablet   Oral   Take 1 tablet (50 mg total) by mouth 3 (three) times daily as needed.   30 tablet   0   . Multiple Vitamins-Minerals (MULTIVITAMIN WITH MINERALS) tablet   Oral   Take 1 tablet by mouth daily.           Allergies Review of patient's allergies indicates no known allergies.  Family History  Problem Relation Age of Onset  . Alzheimer's disease Mother   . AAA (abdominal aortic aneurysm) Father   . Breast cancer Sister 42    Social History Social History  Substance Use Topics  . Smoking status: Current Some Day Smoker -- 0.50 packs/day    Last Attempt to Quit: 08/27/2014  . Smokeless tobacco: Not on file  . Alcohol Use: No    Review of Systems Constitutional: No fever/chills Eyes: No visual changes. ENT: No sore throat. Cardiovascular: Denies chest pain. Respiratory: Denies shortness of  breath. Gastrointestinal: No abdominal pain.  No nausea, no vomiting.  No diarrhea.  No constipation. Genitourinary: Negative for dysuria. Musculoskeletal: Negative for back pain. Skin:Skin irritation Neurological: Negative for headaches, focal weakness or numbness. Psychiatric:Depression  ____________________________________________   PHYSICAL EXAM:  VITAL SIGNS: ED Triage Vitals  Enc Vitals Group     BP 12/27/15 0834 127/84 mmHg     Pulse Rate 12/27/15 0834 60     Resp 12/27/15 0834 16     Temp 12/27/15 0833 97.5 F (36.4 C)     Temp Source 12/27/15 0833 Oral     SpO2 12/27/15 0834 97 %     Weight 12/27/15 0833 140 lb (63.504 kg)     Height 12/27/15 0833  (1.575 m)     Head Cir --      Peak Flow --      Pain Score 12/27/15 0833 6     Pain Loc --      Pain Edu? --      Excl. in GC? --     Constitutional: Alert and oriented. Well appearing and in no acute distress. Eyes: Conjunctivae are normal. PERRL. EOMI. Head: Atraumatic. Nose: No congestion/rhinnorhea. Mouth/Throat: Mucous membranes are moist.  Oropharynx non-erythematous. Neck: No stridor.  No cervical spine tenderness to palpation. Hematological/Lymphatic/Immunilogical: No cervical lymphadenopathy. Cardiovascular: Normal rate, regular rhythm. Grossly  normal heart sounds.  Good peripheral circulation. Respiratory: Normal respiratory effort.  No retractions. Lungs CTAB. Gastrointestinal: Soft and nontender. No distention. No abdominal bruits. No CVA tenderness. Musculoskeletal: No lower extremity tenderness nor edema.  No joint effusions. Neurologic:  Normal speech and language. No gross focal neurologic deficits are appreciated. No gait instability. Skin:  Skin is warm, dry and intact. No rash noted. Signs of excoriation posterior neck. No signs symptoms secondary infection. Psychiatric: Mood and affect are normal. Speech and behavior are normal.  ____________________________________________   LABS (all  labs ordered are listed, but only abnormal results are displayed)  Labs Reviewed - No data to display ____________________________________________  EKG   ____________________________________________  RADIOLOGY   ____________________________________________   PROCEDURES  Procedure(s) performed: None  Critical Care performed: No  ____________________________________________   INITIAL IMPRESSION / ASSESSMENT AND PLAN / ED COURSE  Pertinent labs & imaging results that were available during my care of the patient were reviewed by me and considered in my medical decision making (see chart for details).  Pruritis.  Patient given discharge care instructions and a prescription for Atarax. Patient advised follow-up with her [facility] appointment this morning.. ____________________________________________   FINAL CLINICAL IMPRESSION(S) / ED DIAGNOSES  Final diagnoses:  Pruritus of scalp      NEW MEDICATIONS STARTED DURING THIS VISIT:  New Prescriptions   HYDROXYZINE (ATARAX/VISTARIL) 50 MG TABLET    Take 1 tablet (50 mg total) by mouth 3 (three) times daily as needed.     Note:  This document was prepared using Dragon voice recognition software and may include unintentional dictation errors.     Joni Reiningonald K Smith, PA-C 12/27/15 [id number]  Jene Everyobert Kinner, MD 12/27/15 1249

## 2015-12-27 NOTE — ED Notes (Signed)
Pt seen here last night for tick bite; pt reports continued pain and itching to head. Pt was referred to [facility] last night in d/c paperwork, asking questions in triage about [facility]. Pt denies SI, HI or hallucinations.

## 2015-12-27 NOTE — Discharge Instructions (Signed)
Pruritus °Pruritus is an itching feeling. There are many different conditions and factors that can make your skin itchy. Dry skin is one of the most common causes of itching. Most cases of itching do not require medical attention. Itchy skin can turn into a rash.  °HOME CARE INSTRUCTIONS  °Watch your pruritus for any changes. Take these steps to help with your condition:  °Skin Care °· Moisturize your skin as needed. A moisturizer that contains petroleum jelly is best for keeping moisture in your skin. °· Take or apply medicines only as directed by your health care provider. This may include: °¨ Corticosteroid cream. °¨ Anti-itch lotions. °¨ Oral anti-histamines. °· Apply cool compresses to the affected areas. °· Try taking a bath with: °¨ Epsom salts. Follow the instructions on the packaging. You can get these at your local pharmacy or grocery store. °¨ Baking soda. Pour a small amount into the bath as directed by your health care provider. °¨ Colloidal oatmeal. Follow the instructions on the packaging. You can get this at your local pharmacy or grocery store. °· Try applying baking soda paste to your skin. Stir water into baking soda until it reaches a paste-like consistency.   °· Do not scratch your skin. °· Avoid hot showers or baths, which can make itching worse. A cold shower may help with itching as long as you use a moisturizer after. °· Avoid scented soaps, detergents, and perfumes. Use gentle soaps, detergents, perfumes, and other cosmetic products. °General Instructions °· Avoid wearing tight clothes. °· Keep a journal to help track what causes your itch. Write down: °¨ What you eat. °¨ What cosmetic products you use. °¨ What you drink. °¨ What you wear. This includes jewelry. °· Use a humidifier. This keeps the air moist, which helps to prevent dry skin. °SEEK MEDICAL CARE IF: °· The itching does not go away after several days. °· You sweat at night. °· You have weight loss. °· You are unusually  thirsty. °· You urinate more than normal. °· You are more tired than normal. °· You have abdominal pain. °· Your skin tingles. °· You feel weak. °· Your skin or the whites of your eyes look yellow (jaundice). °· Your skin feels numb. °  °This information is not intended to replace advice given to you by your health care provider. Make sure you discuss any questions you have with your health care provider. °  °Document Released: 04/23/2011 Document Revised: 12/26/2014 Document Reviewed: 08/07/2014 °Elsevier Interactive Patient Education ©2016 Else[employer] Inc. ° °
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
## 2016-01-16 ENCOUNTER — Encounter: Payer: Self-pay | Admitting: Emergency Medicine

## 2016-01-16 ENCOUNTER — Emergency Department: Payer: Self-pay

## 2016-01-16 ENCOUNTER — Emergency Department
Admission: EM | Admit: 2016-01-16 | Discharge: 2016-01-16 | Disposition: A | Discharge status: Home / Self Care | Payer: Self-pay | Attending: Emergency Medicine | Admitting: Emergency Medicine

## 2016-01-16 DIAGNOSIS — J209 Acute bronchitis, unspecified: Secondary | ICD-10-CM | POA: Insufficient documentation

## 2016-01-16 DIAGNOSIS — F17200 Nicotine dependence, unspecified, uncomplicated: Secondary | ICD-10-CM | POA: Insufficient documentation

## 2016-01-16 MED ORDER — ALBUTEROL SULFATE HFA 108 (90 BASE) MCG/ACT IN AERS: 1.0000 | INHALATION_SPRAY | Freq: Four times a day (QID) | RESPIRATORY_TRACT | Status: DC | PRN

## 2016-01-16 MED ORDER — METHYLPREDNISOLONE SODIUM SUCC 125 MG IJ SOLR
80.0000 mg | Freq: Once | INTRAMUSCULAR | Status: AC
  Administered 2016-01-16: 80 mg via INTRAMUSCULAR
  Filled 2016-01-16: qty 2

## 2016-01-16 MED ORDER — AZITHROMYCIN 250 MG PO TABS: ORAL_TABLET | ORAL | Status: DC

## 2016-01-16 MED ORDER — PROMETHAZINE-DM 6.25-15 MG/5ML PO SYRP: 5.0000 mL | ORAL_SOLUTION | Freq: Four times a day (QID) | ORAL | Status: DC | PRN

## 2016-01-16 NOTE — ED Notes (Signed)
Discussed discharge instructions, prescriptions, and follow-up care with patient. No questions or concerns at this time. Pt stable at discharge.  

## 2016-01-16 NOTE — Discharge Instructions (Signed)

## 2016-01-16 NOTE — ED Notes (Signed)
Pt arrived to the ED for complaints of cough and congestion for the last 7 days. Pt states that over the counter medication is not helping. Pt is AOx4 in no apparent distress.

## 2016-01-16 NOTE — ED Provider Notes (Signed)
[facility] Emergency Department Provider Note  ____________________________________________  Time seen: Approximately 9:48 PM  I have reviewed the triage vital signs and the nursing notes.   HISTORY  Chief Complaint Cough and Nasal Congestion    HPI Nicole Chandler is a 55 y.o. female , NAD, presents to the emergency department with four-day history of cough and chest congestion. Has been taking over-the-counter medications without alleviation of symptoms. Also notes some sinus pressure and nasal congestion. Has not had any fevers, chills, body aches. Denies sore throat, ear pain, chest pain, wheezing, abdominal pain, nausea, vomiting. Patient does state that she is a tobacco smoker and smokes 2 cigarettes per day.   Past Medical History  Diagnosis Date  . Chronic back pain unk    There are no active problems to display for this patient.   Past Surgical History  Procedure Laterality Date  . Eye surgery    . Abdominal surgery      Current Outpatient Rx  Name  Route  Sig  Dispense  Refill  . albuterol (PROVENTIL HFA;VENTOLIN HFA) 108 (90 Base) MCG/ACT inhaler   Inhalation   Inhale 1-2 puffs into the lungs every 6 (six) hours as needed for wheezing or shortness of breath.   1 Inhaler   0   . azithromycin (ZITHROMAX Z-PAK) 250 MG tablet      Take 2 tablets (500 mg) on  Day 1,  followed by 1 tablet (250 mg) once daily on Days 2 through 5.   6 each   0   . BIOTIN PO   Oral   Take 1 tablet by mouth daily.         . hydrOXYzine (ATARAX/VISTARIL) 50 MG tablet   Oral   Take 1 tablet (50 mg total) by mouth 3 (three) times daily as needed.   30 tablet   0   . Multiple Vitamins-Minerals (MULTIVITAMIN WITH MINERALS) tablet   Oral   Take 1 tablet by mouth daily.         . promethazine-dextromethorphan (PROMETHAZINE-DM) 6.25-15 MG/5ML syrup   Oral   Take 5 mLs by mouth 4 (four) times daily as needed for cough.   118 mL   0      Allergies Review of patient's allergies indicates no known allergies.  Family History  Problem Relation Age of Onset  . Alzheimer's disease Mother   . AAA (abdominal aortic aneurysm) Father   . Breast cancer Sister 60    Social History Social History  Substance Use Topics  . Smoking status: Current Some Day Smoker -- 0.50 packs/day    Last Attempt to Quit: 08/27/2014  . Smokeless tobacco: None  . Alcohol Use: No     Review of Systems  Constitutional: No fever/chills, fatigue Eyes: No visual changes. No discharge, redness, swelling ENT: Positive nasal congestion, sinus pressure. No sore throat, ear pain, runny nose. Cardiovascular: No chest pain. Respiratory: Positive chest congestion, cough. No shortness of breath. No wheezing.  Gastrointestinal: No abdominal pain.  No nausea, vomiting.  No diarrhea. Musculoskeletal: Negative for back pain.  Skin: Negative for rash. Neurological: Negative for headaches, focal weakness or numbness. 10-point ROS otherwise negative.  ____________________________________________   PHYSICAL EXAM:  VITAL SIGNS: ED Triage Vitals  Enc Vitals Group     BP 01/16/16 2112 94/66 mmHg     Pulse Rate 01/16/16 2112 68     Resp 01/16/16 2112 24     Temp 01/16/16 2112 98.1 F (36.7 C)  Temp Source 01/16/16 2112 Oral     SpO2 01/16/16 2110 94 %     Weight 01/16/16 2112 142 lb (64.411 kg)     Height 01/16/16 2112 5\' 2"  (1.575 m)     Head Cir --      Peak Flow --      Pain Score 01/16/16 2113 0     Pain Loc --      Pain Edu? --      Excl. in GC? --      Constitutional: Alert and oriented. Well appearing and in no acute distress. Eyes: Conjunctivae are normal. PERRL. EOMI without pain.  Head: Atraumatic. ENT:      Ears: TMs visualized bilaterally without effusion, bulging, erythema, perforation      Nose: No congestion/rhinnorhea.      Mouth/Throat: Mucous membranes are moist. Pharynx without erythema, swelling, exudates. Uvula is  midline Neck: Supple with full range of motion Hematological/Lymphatic/Immunilogical: No cervical lymphadenopathy. Cardiovascular: Normal rate, regular rhythm. Grossly normal heart sounds. Good peripheral circulation. Respiratory: Normal respiratory effort without tachypnea or retractions. Moderate wheeze noted in the left lower lobe but all other lobes are clear to auscultation bilaterally. Breath sounds are noted through all lung fields. Neurologic:  Normal speech and language. No gross focal neurologic deficits are appreciated.  Skin:  Skin is warm, dry and intact. No rash noted. Psychiatric: Mood and affect are normal. Speech and behavior are normal. Patient exhibits appropriate insight and judgement.   ____________________________________________   LABS  None ____________________________________________  EKG  None ____________________________________________  RADIOLOGY I have personally viewed and evaluated these images (plain radiographs) as part of my medical decision making, as well as reviewing the written report by the radiologist.  Dg Chest 2 View  01/16/2016  CLINICAL DATA:  Coughing congestion, 7 days duration. EXAM: CHEST  2 VIEW COMPARISON:  04/19/2015 FINDINGS: Heart size is normal. There is central bronchial thickening. No infiltrate, collapse or effusion. No acute bone finding. IMPRESSION: Bronchitis.  No consolidation or collapse. Electronically Signed   By: Paulina FusiMark  Shogry M.D.   On: 01/16/2016 21:30    ____________________________________________    PROCEDURES  Procedure(s) performed: None    Medications  methylPREDNISolone sodium succinate (SOLU-MEDROL) 125 mg/2 mL injection 80 mg (80 mg Intramuscular Given 01/16/16 2156)     ____________________________________________   INITIAL IMPRESSION / ASSESSMENT AND PLAN / ED COURSE  Pertinent imaging results that were available during my care of the patient were reviewed by me and considered in my medical  decision making (see chart for details).  Patient's diagnosis is consistent with acute bronchitis. Patient will be discharged home with prescriptions for albuterol inhaler, Z-Pak and promethazine DM to take as directed. Patient was given IM Solu-Medrol prior to discharge and tolerated well without side effects. Patient advised of tobacco cessation. Patient [facility] to [facility] if symptoms persist past this treatment course. Patient is given ED precautions to return to the ED for any worsening or new symptoms.    ____________________________________________  FINAL CLINICAL IMPRESSION(S) / ED DIAGNOSES  Final diagnoses:  Acute bronchitis, unspecified organism      NEW MEDICATIONS STARTED DURING THIS VISIT:  New Prescriptions   ALBUTEROL (PROVENTIL HFA;VENTOLIN HFA) 108 (90 BASE) MCG/ACT INHALER    Inhale 1-2 puffs into the lungs every 6 (six) hours as needed for wheezing or shortness of breath.   AZITHROMYCIN (ZITHROMAX Z-PAK) 250 MG TABLET    Take 2 tablets (500 mg) on  Day 1,  followed by 1 tablet (250 mg) once daily on Days 2 through 5.   PROMETHAZINE-DEXTROMETHORPHAN (PROMETHAZINE-DM) 6.25-15 MG/5ML SYRUP    Take 5 mLs by mouth 4 (four) times daily as needed for cough.         Hope Pigeon, PA-C 01/16/16 2214  Minna Antis, MD 01/16/16 2306

## 2016-02-18 ENCOUNTER — Emergency Department: Payer: Self-pay

## 2016-02-18 ENCOUNTER — Emergency Department
Admission: EM | Admit: 2016-02-18 | Discharge: 2016-02-18 | Disposition: A | Discharge status: Home / Self Care | Payer: Self-pay | Attending: Emergency Medicine | Admitting: Emergency Medicine

## 2016-02-18 ENCOUNTER — Encounter: Payer: Self-pay | Admitting: Emergency Medicine

## 2016-02-18 DIAGNOSIS — F17200 Nicotine dependence, unspecified, uncomplicated: Secondary | ICD-10-CM | POA: Insufficient documentation

## 2016-02-18 DIAGNOSIS — Z79899 Other long term (current) drug therapy: Secondary | ICD-10-CM | POA: Insufficient documentation

## 2016-02-18 DIAGNOSIS — F1290 Cannabis use, unspecified, uncomplicated: Secondary | ICD-10-CM | POA: Insufficient documentation

## 2016-02-18 DIAGNOSIS — M546 Pain in thoracic spine: Secondary | ICD-10-CM | POA: Insufficient documentation

## 2016-02-18 DIAGNOSIS — M25512 Pain in left shoulder: Secondary | ICD-10-CM | POA: Insufficient documentation

## 2016-02-18 MED ORDER — IBUPROFEN 600 MG PO TABS
600.0000 mg | ORAL_TABLET | Freq: Once | ORAL | Status: AC
  Administered 2016-02-18: 600 mg via ORAL
  Filled 2016-02-18: qty 1

## 2016-02-18 MED ORDER — HYDROCODONE-ACETAMINOPHEN 5-325 MG PO TABS
1.0000 | ORAL_TABLET | Freq: Once | ORAL | Status: AC
  Administered 2016-02-18: 1 via ORAL
  Filled 2016-02-18: qty 1

## 2016-02-18 MED ORDER — IBUPROFEN 600 MG PO TABS: 600.0000 mg | ORAL_TABLET | Freq: Three times a day (TID) | ORAL | Status: DC | PRN

## 2016-02-18 MED ORDER — CYCLOBENZAPRINE HCL 5 MG PO TABS: ORAL_TABLET | ORAL | Status: DC

## 2016-02-18 NOTE — Discharge Instructions (Signed)
1. You may take medicines as needed for pain and muscle spasms (Motrin/Flexeril #15). 2. Apply ice to affected area several times daily. 3. Return to the ER for worsening symptoms, persistent vomiting, difficulty breathing or other concerns.  Shoulder Pain The shoulder is the joint that connects your arms to your body. The bones that form the shoulder joint include the upper arm bone (humerus), the shoulder blade (scapula), and the collarbone (clavicle). The top of the humerus is shaped like a ball and fits into a rather flat socket on the scapula (glenoid cavity). A combination of muscles and strong, fibrous tissues that connect muscles to bones (tendons) support your shoulder joint and hold the ball in the socket. Small, fluid-filled sacs (bursae) are located in different areas of the joint. They act as cushions between the bones and the overlying soft tissues and help reduce friction between the gliding tendons and the bone as you move your arm. Your shoulder joint allows a wide range of motion in your arm. This range of motion allows you to do things like scratch your back or throw a ball. However, this range of motion also makes your shoulder more prone to pain from overuse and injury. Causes of shoulder pain can originate from both injury and overuse and usually can be grouped in the following four categories:  Redness, swelling, and pain (inflammation) of the tendon (tendinitis) or the bursae (bursitis).  Instability, such as a dislocation of the joint.  Inflammation of the joint (arthritis).  Broken bone (fracture). HOME CARE INSTRUCTIONS   Apply ice to the sore area.  Put ice in a plastic bag.  Place a towel between your skin and the bag.  Leave the ice on for 15-20 minutes, 3-4 times per day for the first 2 days, or as directed by your health care provider.  Stop using cold packs if they do not help with the pain.  If you have a shoulder sling or immobilizer, wear it as long as  your caregiver instructs. Only remove it to shower or bathe. Move your arm as little as possible, but keep your hand moving to prevent swelling.  Squeeze a soft ball or foam pad as much as possible to help prevent swelling.  Only take over-the-counter or prescription medicines for pain, discomfort, or fever as directed by your caregiver. SEEK MEDICAL CARE IF:   Your shoulder pain increases, or new pain develops in your arm, hand, or fingers.  Your hand or fingers become cold and numb.  Your pain is not relieved with medicines. SEEK IMMEDIATE MEDICAL CARE IF:   Your arm, hand, or fingers are numb or tingling.  Your arm, hand, or fingers are significantly swollen or turn white or blue. MAKE SURE YOU:   Understand these instructions.  Will watch your condition.  Will get help right away if you are not doing well or get worse.   This information is not intended to replace advice given to you by your health care provider. Make sure you discuss any questions you have with your health care provider.   Document Released: 05/21/2005 Document Revised: 09/01/2014 Document Reviewed: 12/04/2014 Elsevier Interactive Patient Education 2016 Else[employer] Inc[employer]  Back Pain, Adult Back pain is very common. The pain often gets better over time. The cause of back pain is usually not dangerous. Most people can learn to manage their back pain on their own.  HOME CARE  Watch your back pain for any changes. The following actions may help to lessen any  pain you are feeling:  Stay active. Start with short walks on flat ground if you can. Try to walk farther each day.  Exercise regularly as told by your doctor. Exercise helps your back heal faster. It also helps avoid future injury by keeping your muscles strong and flexible.  Do not sit, drive, or stand in one place for more than 30 minutes.  Do not stay in bed. Resting more than 1-2 days can slow down your recovery.  Be careful when you bend or lift an  object. Use good form when lifting:  Bend at your knees.  Keep the object close to your body.  Do not twist.  Sleep on a firm mattress. Lie on your side, and bend your knees. If you lie on your back, put a pillow under your knees.  Take medicines only as told by your doctor.  Put ice on the injured area.  Put ice in a plastic bag.  Place a towel between your skin and the bag.  Leave the ice on for 20 minutes, 2-3 times a day for the first 2-3 days. After that, you can switch between ice and heat packs.  Avoid feeling anxious or stressed. Find good ways to deal with stress, such as exercise.  Maintain a healthy weight. Extra weight puts stress on your back. GET HELP IF:   You have pain that does not go away with rest or medicine.  You have worsening pain that goes down into your legs or buttocks.  You have pain that does not get better in one week.  You have pain at night.  You lose weight.  You have a fever or chills. GET HELP RIGHT AWAY IF:   You cannot control when you poop (bowel movement) or pee (urinate).  Your arms or legs feel weak.  Your arms or legs lose feeling (numbness).  You feel sick to your stomach (nauseous) or throw up (vomit).  You have belly (abdominal) pain.  You feel like you may pass out (faint).   This information is not intended to replace advice given to you by your health care provider. Make sure you discuss any questions you have with your health care provider.   Document Released: 01/28/2008 Document Revised: 09/01/2014 Document Reviewed: 12/13/2013 Elsevier Interactive Patient Education 2016 Elsevier Inc.  Cryotherapy Cryotherapy is when you put ice on your injury. Ice helps lessen pain and puffiness (swelling) after an injury. Ice works the best when you start using it in the first 24 to 48 hours after an injury. HOME CARE  Put a dry or damp towel between the ice pack and your skin.  You may press gently on the ice  pack.  Leave the ice on for no more than 10 to 20 minutes at a time.  Check your skin after 5 minutes to make sure your skin is okay.  Rest at least 20 minutes between ice pack uses.  Stop using ice when your skin loses feeling (numbness).  Do not use ice on someone who cannot tell you when it hurts. This includes small children and people with memory problems (dementia). GET HELP RIGHT AWAY IF:  You have white spots on your skin.  Your skin turns blue or pale.  Your skin feels waxy or hard.  Your puffiness gets worse. MAKE SURE YOU:   Understand these instructions.  Will watch your condition.  Will get help right away if you are not doing well or get worse.   This information  is not intended to replace advice given to you by your health care provider. Make sure you discuss any questions you have with your health care provider.   Document Released: 01/28/2008 Document Revised: 11/03/2011 Document Reviewed: 04/03/2011 Elsevier Interactive Patient Education [employer]

## 2016-02-18 NOTE — ED Provider Notes (Signed)
[facility] Emergency Department Provider Note   ____________________________________________  Time seen: Approximately 5:10 AM  I have reviewed the triage vital signs and the nursing notes.   HISTORY  Chief Complaint Back Pain    HPI Nicole Chandler is a 55 y.o. female who presents to the ED from home with a chief complaint of left scapular pain. Patient reports she tripped over her dog yesterday. Complains of spasms from her left scapula radiating into her neck. Denies striking head or LOC. Denies neck pain, vision changes, chest pain, shortness of breath, fever, chills, abdominal pain, nausea, vomiting, diarrhea. Nothing makes her pain better. Movement makes her pain worse.   Past Medical History  Diagnosis Date  . Chronic back pain unk    There are no active problems to display for this patient.   Past Surgical History  Procedure Laterality Date  . Eye surgery    . Abdominal surgery      Current Outpatient Rx  Name  Route  Sig  Dispense  Refill  . albuterol (PROVENTIL HFA;VENTOLIN HFA) 108 (90 Base) MCG/ACT inhaler   Inhalation   Inhale 1-2 puffs into the lungs every 6 (six) hours as needed for wheezing or shortness of breath.   1 Inhaler   0   . BIOTIN PO   Oral   Take 1 tablet by mouth daily.         . Multiple Vitamins-Minerals (MULTIVITAMIN WITH MINERALS) tablet   Oral   Take 1 tablet by mouth daily.         . cyclobenzaprine (FLEXERIL) 5 MG tablet      1 tablet every 8 hours as needed for muscle spasms   15 tablet   0   . ibuprofen (ADVIL,MOTRIN) 600 MG tablet   Oral   Take 1 tablet (600 mg total) by mouth every 8 (eight) hours as needed.   15 tablet   0     Allergies Review of patient's allergies indicates no known allergies.  Family History  Problem Relation Age of Onset  . Alzheimer's disease Mother   . AAA (abdominal aortic aneurysm) Father   . Breast cancer Sister [id number]    Social History Social  History  Substance Use Topics  . Smoking status: Current Some Day Smoker -- 0.50 packs/day    Last Attempt to Quit: 08/27/2014  . Smokeless tobacco: None  . Alcohol Use: No    Review of Systems  Constitutional: No fever/chills. Eyes: No visual changes. ENT: No sore throat. Cardiovascular: Denies chest pain. Respiratory: Denies shortness of breath. Gastrointestinal: No abdominal pain.  No nausea, no vomiting.  No diarrhea.  No constipation. Genitourinary: Negative for dysuria. Musculoskeletal: Positive for left scapula pain. Negative for back pain. Skin: Negative for rash. Neurological: Negative for headaches, focal weakness or numbness.  10-point ROS otherwise negative.  ____________________________________________   PHYSICAL EXAM:  VITAL SIGNS: ED Triage Vitals  Enc Vitals Group     BP 02/18/16 0207 146/97 mmHg     Pulse Rate 02/18/16 0207 85     Resp 02/18/16 0207 18     Temp 02/18/16 0207 97.8 F (36.6 C)     Temp Source 02/18/16 0207 Oral     SpO2 02/18/16 0207 100 %     Weight 02/18/16 0207 126 lb (57.153 kg)     Height 02/18/16 0207 5\' 2"  (1.575 m)     Head Cir --      Peak Flow --  Pain Score 02/18/16 0208 8     Pain Loc --      Pain Edu? --      Excl. in GC? --     Constitutional: Alert and oriented. Well appearing and in no acute distress. Slightly histrionic; cries without tears. Eyes: Conjunctivae are normal. PERRL. EOMI. Head: Atraumatic. Nose: No congestion/rhinnorhea. Mouth/Throat: Mucous membranes are moist.  Oropharynx non-erythematous. Neck: No stridor.  No cervical spine tenderness to palpation. Cardiovascular: Normal rate, regular rhythm. Grossly normal heart sounds.  Good peripheral circulation. Respiratory: Normal respiratory effort.  No retractions. Lungs CTAB. Gastrointestinal: Soft and nontender. No distention. No abdominal bruits. No CVA tenderness. Musculoskeletal: Left posterior shoulder/scapular area tender to palpation with  muscle spasms. Full range of motion. 2+ radial pulses. Brisk, less than 5 second capillary refill. Motor strength and sensation are intact. Neurologic:  Normal speech and language. No gross focal neurologic deficits are appreciated. No gait instability. Skin:  Skin is warm, dry and intact. No rash noted. Psychiatric: Mood and affect are normal. Speech and behavior are normal.  ____________________________________________   LABS (all labs ordered are listed, but only abnormal results are displayed)  Labs Reviewed - No data to display ____________________________________________  EKG  None ____________________________________________  RADIOLOGY  Left scapula x-rays (reviewed by me, interpreted per Dr. Clovis RileyMitchell): Negative. ____________________________________________   PROCEDURES  Procedure(s) performed: None  Critical Care performed: No  ____________________________________________   INITIAL IMPRESSION / ASSESSMENT AND PLAN / ED COURSE  Pertinent labs & imaging results that were available during my care of the patient were reviewed by me and considered in my medical decision making (see chart for details).  55 year old female who presents with a one-day history of left scapular/shoulder pain after tripping over her dog. Patient has full range of motion without much pain or difficulty. Plan for analgesia in the ED; prescriptions for NSAIDs and muscle relaxer to go home with. Orthopedics follow-up as needed. Strict return precautions given. Patient verbalizes understanding and agrees with plan of care. ____________________________________________   FINAL CLINICAL IMPRESSION(S) / ED DIAGNOSES  Final diagnoses:  Left shoulder pain  Left-sided thoracic back pain      NEW MEDICATIONS STARTED DURING THIS VISIT:  New Prescriptions   CYCLOBENZAPRINE (FLEXERIL) 5 MG TABLET    1 tablet every 8 hours as needed for muscle spasms   IBUPROFEN (ADVIL,MOTRIN) 600 MG TABLET     Take 1 tablet (600 mg total) by mouth every 8 (eight) hours as needed.     Note:  This document was prepared using Dragon voice recognition software and may include unintentional dictation errors.    Irean HongJade J Mirabelle Cyphers, MD 02/18/16 [phone]

## 2016-02-18 NOTE — ED Notes (Addendum)
Pt says she tripped over her dog early yesterday; c/o pain to left scapula; spasms that are radiating up into her neck; pt using arm in triage, full ROM

## 2016-02-24 ENCOUNTER — Encounter: Payer: Self-pay | Admitting: Emergency Medicine

## 2016-02-24 DIAGNOSIS — F17210 Nicotine dependence, cigarettes, uncomplicated: Secondary | ICD-10-CM | POA: Insufficient documentation

## 2016-02-24 DIAGNOSIS — F1410 Cocaine abuse, uncomplicated: Secondary | ICD-10-CM | POA: Insufficient documentation

## 2016-02-24 DIAGNOSIS — F1910 Other psychoactive substance abuse, uncomplicated: Secondary | ICD-10-CM | POA: Insufficient documentation

## 2016-02-24 DIAGNOSIS — F1210 Cannabis abuse, uncomplicated: Secondary | ICD-10-CM | POA: Insufficient documentation

## 2016-02-24 DIAGNOSIS — F319 Bipolar disorder, unspecified: Secondary | ICD-10-CM | POA: Insufficient documentation

## 2016-02-24 DIAGNOSIS — T39312A Poisoning by propionic acid derivatives, intentional self-harm, initial encounter: Secondary | ICD-10-CM | POA: Insufficient documentation

## 2016-02-24 DIAGNOSIS — Z79899 Other long term (current) drug therapy: Secondary | ICD-10-CM | POA: Insufficient documentation

## 2016-02-24 LAB — CBC
HCT: 36.6 % (ref 35.0–47.0)
HEMOGLOBIN: 12.3 g/dL (ref 12.0–16.0)
MCH: 30.4 pg (ref 26.0–34.0)
MCHC: 33.7 g/dL (ref 32.0–36.0)
MCV: 90.1 fL (ref 80.0–100.0)
Platelets: 273 10*3/uL (ref 150–440)
RBC: 4.06 MIL/uL (ref 3.80–5.20)
RDW: 14.7 % — ABNORMAL HIGH (ref 11.5–14.5)
WBC: 8.2 10*3/uL (ref 3.6–11.0)

## 2016-02-24 NOTE — ED Notes (Signed)
Pt says she wants a behavioral health exam; pt says she's depressed but not suicidal; taking Klonopin which just makes her very drowsy; says all she does is eat and sleep; pt drowsy in triage; slurred speech; denies alcohol use; admits to marijuana use but not today; "tooted a couple of lines of cocaine last night at a party"

## 2016-02-25 ENCOUNTER — Encounter: Payer: Self-pay | Admitting: Psychiatry

## 2016-02-25 ENCOUNTER — Emergency Department
Admission: EM | Admit: 2016-02-25 | Discharge: 2016-02-25 | Disposition: A | Discharge status: Home / Self Care | Payer: Self-pay | Attending: Emergency Medicine | Admitting: Emergency Medicine

## 2016-02-25 DIAGNOSIS — F1420 Cocaine dependence, uncomplicated: Secondary | ICD-10-CM

## 2016-02-25 DIAGNOSIS — F1910 Other psychoactive substance abuse, uncomplicated: Secondary | ICD-10-CM

## 2016-02-25 DIAGNOSIS — F1194 Opioid use, unspecified with opioid-induced mood disorder: Secondary | ICD-10-CM

## 2016-02-25 DIAGNOSIS — F1220 Cannabis dependence, uncomplicated: Secondary | ICD-10-CM

## 2016-02-25 DIAGNOSIS — F1120 Opioid dependence, uncomplicated: Secondary | ICD-10-CM

## 2016-02-25 DIAGNOSIS — T50902A Poisoning by unspecified drugs, medicaments and biological substances, intentional self-harm, initial encounter: Secondary | ICD-10-CM

## 2016-02-25 DIAGNOSIS — F17200 Nicotine dependence, unspecified, uncomplicated: Secondary | ICD-10-CM

## 2016-02-25 HISTORY — DX: Bipolar disorder, unspecified: F31.9

## 2016-02-25 HISTORY — DX: Depression, unspecified: F32.A

## 2016-02-25 HISTORY — DX: Major depressive disorder, single episode, unspecified: F32.9

## 2016-02-25 HISTORY — DX: Panic disorder (episodic paroxysmal anxiety): F41.0

## 2016-02-25 LAB — COMPREHENSIVE METABOLIC PANEL
ALT: 8 U/L — AB (ref 14–54)
ANION GAP: 8 (ref 5–15)
AST: 24 U/L (ref 15–41)
Albumin: 4.1 g/dL (ref 3.5–5.0)
Alkaline Phosphatase: 67 U/L (ref 38–126)
BUN: 14 mg/dL (ref 6–20)
CHLORIDE: 107 mmol/L (ref 101–111)
CO2: 24 mmol/L (ref 22–32)
CREATININE: 1.36 mg/dL — AB (ref 0.44–1.00)
Calcium: 8.9 mg/dL (ref 8.9–10.3)
GFR, EST AFRICAN AMERICAN: 50 mL/min — AB (ref >60)
GFR, EST NON AFRICAN AMERICAN: 43 mL/min — AB (ref >60)
Glucose, Bld: 120 mg/dL — ABNORMAL HIGH (ref 65–99)
POTASSIUM: 3.5 mmol/L (ref 3.5–5.1)
Sodium: 139 mmol/L (ref 135–145)
Total Bilirubin: 0.4 mg/dL (ref 0.3–1.2)
Total Protein: 6.8 g/dL (ref 6.5–8.1)

## 2016-02-25 LAB — URINALYSIS COMPLETE WITH MICROSCOPIC (ARMC ONLY)
BACTERIA UA: NONE SEEN
Bilirubin Urine: NEGATIVE
GLUCOSE, UA: NEGATIVE mg/dL
Ketones, ur: NEGATIVE mg/dL
Leukocytes, UA: NEGATIVE
Nitrite: NEGATIVE
Protein, ur: NEGATIVE mg/dL
SPECIFIC GRAVITY, URINE: 1.003 — AB (ref 1.005–1.030)
pH: 6 (ref 5.0–8.0)

## 2016-02-25 LAB — URINE DRUG SCREEN, QUALITATIVE (ARMC ONLY)
Amphetamines, Ur Screen: NOT DETECTED
Barbiturates, Ur Screen: NOT DETECTED
Benzodiazepine, Ur Scrn: NOT DETECTED
CANNABINOID 50 NG, UR ~~LOC~~: POSITIVE — AB
Cocaine Metabolite,Ur ~~LOC~~: POSITIVE — AB
MDMA (ECSTASY) UR SCREEN: NOT DETECTED
METHADONE SCREEN, URINE: NOT DETECTED
Opiate, Ur Screen: NOT DETECTED
Phencyclidine (PCP) Ur S: NOT DETECTED
TRICYCLIC, UR SCREEN: POSITIVE — AB

## 2016-02-25 LAB — ACETAMINOPHEN LEVEL

## 2016-02-25 LAB — TROPONIN I: Troponin I: <0.03 ng/mL (ref <0.03)

## 2016-02-25 LAB — ETHANOL

## 2016-02-25 LAB — SALICYLATE LEVEL

## 2016-02-25 LAB — PREGNANCY, URINE: Preg Test, Ur: NEGATIVE

## 2016-02-25 MED ORDER — HALOPERIDOL LACTATE 5 MG/ML IJ SOLN
5.0000 mg | Freq: Once | INTRAMUSCULAR | Status: AC
  Administered 2016-02-25: 5 mg via INTRAMUSCULAR
  Filled 2016-02-25: qty 1

## 2016-02-25 MED ORDER — LORAZEPAM 2 MG/ML IJ SOLN
INTRAMUSCULAR | Status: AC
  Administered 2016-02-25: 2 mg via INTRAMUSCULAR
  Filled 2016-02-25: qty 1

## 2016-02-25 MED ORDER — SODIUM CHLORIDE 0.9 % IV BOLUS (SEPSIS)
1000.0000 mL | Freq: Once | INTRAVENOUS | Status: AC
  Administered 2016-02-25: 1000 mL via INTRAVENOUS

## 2016-02-25 MED ORDER — DIPHENHYDRAMINE HCL 25 MG PO CAPS
50.0000 mg | ORAL_CAPSULE | Freq: Once | ORAL | Status: AC
  Administered 2016-02-25: 50 mg via ORAL
  Filled 2016-02-25: qty 2

## 2016-02-25 MED ORDER — HALOPERIDOL LACTATE 5 MG/ML IJ SOLN
INTRAMUSCULAR | Status: AC
  Administered 2016-02-25: 5 mg via INTRAMUSCULAR
  Filled 2016-02-25: qty 1

## 2016-02-25 MED ORDER — LORAZEPAM 2 MG/ML IJ SOLN
2.0000 mg | Freq: Once | INTRAMUSCULAR | Status: AC
  Administered 2016-02-25: 2 mg via INTRAMUSCULAR
  Filled 2016-02-25: qty 1

## 2016-02-25 MED ORDER — DIPHENHYDRAMINE HCL 50 MG/ML IJ SOLN
25.0000 mg | Freq: Once | INTRAMUSCULAR | Status: AC
  Administered 2016-02-25: 25 mg via INTRAVENOUS
  Filled 2016-02-25: qty 1

## 2016-02-25 NOTE — ED Provider Notes (Addendum)
[facility] Emergency Department Provider Note   ____________________________________________  Time seen: Approximately 1:14 AM  I have reviewed the triage vital signs and the nursing notes.   HISTORY  Chief Complaint Medical Clearance and Depression    HPI Nicole Chandler is a 55 y.o. female patient comes in complaining of depression and wanting mental health exam. There is is able to find out that apparently she was doing drugs and her boyfriend kicked her out. He would not let her back, she get some help. Boyfriend told the nurse that patient took at least 30 pills medicine she got here for scapular pain a few days ago. These medications were ibuprofen and Flexeril. Patient's blood pressure is currently in the 80s. Patient is refusing treatment being very verbally abusive and also physically threatening to the nurse. I will get commitment papers as she is obviously danger to herself and others and we will sedate her and give her IV fluids and continue her evaluation.   Past Medical History  Diagnosis Date  . Chronic back pain unk  . Depression   . Bipolar 1 disorder (HCC)   . Panic attacks     There are no active problems to display for this patient.   Past Surgical History  Procedure Laterality Date  . Eye surgery    . Abdominal surgery      Current Outpatient Rx  Name  Route  Sig  Dispense  Refill  . clonazePAM (KLONOPIN) 0.5 MG tablet   Oral   Take 0.5 mg by mouth 3 (three) times daily.         Marland Kitchen. albuterol (PROVENTIL HFA;VENTOLIN HFA) 108 (90 Base) MCG/ACT inhaler   Inhalation   Inhale 1-2 puffs into the lungs every 6 (six) hours as needed for wheezing or shortness of breath.   1 Inhaler   0   . BIOTIN PO   Oral   Take 1 tablet by mouth daily.         . cyclobenzaprine (FLEXERIL) 5 MG tablet      1 tablet every 8 hours as needed for muscle spasms   15 tablet   0   . ibuprofen (ADVIL,MOTRIN) 600 MG tablet   Oral   Take 1  tablet (600 mg total) by mouth every 8 (eight) hours as needed.   15 tablet   0   . Multiple Vitamins-Minerals (MULTIVITAMIN WITH MINERALS) tablet   Oral   Take 1 tablet by mouth daily.           Allergies Review of patient's allergies indicates no known allergies.  Family History  Problem Relation Age of Onset  . Alzheimer's disease Mother   . AAA (abdominal aortic aneurysm) Father   . Breast cancer Sister 2255    Social History Social History  Substance Use Topics  . Smoking status: Current Some Day Smoker -- 0.50 packs/day    Types: Cigarettes    Last Attempt to Quit: 08/27/2014  . Smokeless tobacco: None  . Alcohol Use: No    Review of Systems Constitutional: No fever/chills Eyes: No visual changes. ENT: No sore throat. Cardiovascular: Denies chest pain. Respiratory: Denies shortness of breath. Gastrointestinal: No abdominal pain.  No nausea, no vomiting.  No diarrhea.  No constipation. Genitourinary: Negative for dysuria. Musculoskeletal: Negative for back pain. Skin: Negative for rash. Neurological: Negative for headaches, focal weakness or numbness.  10-point ROS otherwise negative.  ____________________________________________   PHYSICAL EXAM:  VITAL SIGNS: ED Triage Vitals  Enc Vitals Group     BP 02/24/16 2347 125/86 mmHg     Pulse Rate 02/24/16 2347 79     Resp 02/24/16 2347 18     Temp 02/24/16 2347 97.8 F (36.6 C)     Temp Source 02/24/16 2347 Oral     SpO2 02/24/16 2347 95 %     Weight 02/24/16 2347 120 lb (54.432 kg)     Height 02/24/16 2347 5\' 2"  (1.575 m)     Head Cir --      Peak Flow --      Pain Score 02/24/16 2347 7     Pain Loc --      Pain Edu? --      Excl. in GC? --     Constitutional: Alert and oriented. Well appearing and in no acute distressInitially very uncooperative later on will cooperate. Eyes: Conjunctivae are normal. PERRL. EOMI. Head: Atraumatic. Nose: No congestion/rhinnorhea. Mouth/Throat: Mucous membranes  are moist.  Oropharynx non-erythematous. Neck: No stridor.   Cardiovascular: Normal rate, regular rhythm. Grossly normal heart sounds.  Good peripheral circulation. Respiratory: Normal respiratory effort.  No retractions.  Gastrointestinal: Soft and nontender. No distention. No abdominal bruits. No CVA tenderness. Musculoskeletal: No lower extremity tenderness nor edema.  No joint effusions. Neurologic:  Normal speech and language. No gross focal neurologic deficits are appreciated. No gait instability. Skin:  Skin is warm, dry and intact. No rash noted.   ____________________________________________   LABS (all labs ordered are listed, but only abnormal results are displayed)  Labs Reviewed  COMPREHENSIVE METABOLIC PANEL - Abnormal; Notable for the following:    Glucose, Bld 120 (*)    Creatinine, Ser 1.36 (*)    ALT 8 (*)    GFR calc non Af Amer 43 (*)    GFR calc Af Amer 50 (*)    All other components within normal limits  CBC - Abnormal; Notable for the following:    RDW 14.7 (*)    All other components within normal limits  URINE DRUG SCREEN, QUALITATIVE ([facility] ONLY) - Abnormal; Notable for the following:    Tricyclic, Ur Screen POSITIVE (*)    Cocaine Metabolite,Ur [place] POSITIVE (*)    Cannabinoid 50 Ng, Ur [place] POSITIVE (*)    All other components within normal limits  ACETAMINOPHEN LEVEL - Abnormal; Notable for the following:    Acetaminophen (Tylenol), Serum <10 (*)    All other components within normal limits  URINALYSIS COMPLETEWITH MICROSCOPIC ([facility] ONLY) - Abnormal; Notable for the following:    Color, Urine COLORLESS (*)    APPearance CLEAR (*)    Specific Gravity, Urine 1.003 (*)    Hgb urine dipstick 1+ (*)    Squamous Epithelial / LPF 0-5 (*)    All other components within normal limits  ETHANOL  SALICYLATE LEVEL  TROPONIN I  PREGNANCY, URINE   ____________________________________________  EKG  EKG read and interpreted by me shows normal sinus rhythm  rate of 65 normal axis no acute ST-T wave changes ____________________________________________  RADIOLOGY   ____________________________________________   PROCEDURES    ____________________________________________   INITIAL IMPRESSION / ASSESSMENT AND PLAN / ED COURSE  Pertinent labs & imaging results that were available during my care of the patient were reviewed by me and considered in my medical decision making (see chart for details).   ____________________________________________   FINAL CLINICAL IMPRESSION(S) / ED DIAGNOSES  Final diagnoses:  Overdose, intentional self-harm, initial encounter (HCC)      NEW MEDICATIONS  STARTED DURING THIS VISIT:  New Prescriptions   No medications on file     Note:  This document was prepared using Dragon voice recognition software and may include unintentional dictation errors.    Arnaldo Natal, MD 02/25/16 [id number]  Patient somewhat agitated didn't keeps moving around. She says she has restless legs and usually takes a cupful of Benadryl about 30 cc for this. If this is about 75 mg. Have given her an extra 50 mg and hopefully this problem will abate shortly as she usually takes about 75 mg at home.  Arnaldo Natal, MD 02/25/16 [phone]

## 2016-02-25 NOTE — ED Notes (Signed)
Pt's boyfriend in the lobby says over the last week pt has been having visual hallucinations; he also reports pt was seen at a mental health clinic in the last week and verbalized suicidal thoughts; he says pt lives with him, he kicked her out 3 days ago; when she returned today he told her he only way he'd let her back in the house was if she got some help; "she has mental issues and she's a drug abuser"

## 2016-02-25 NOTE — ED Notes (Addendum)
BEHAVIORAL HEALTH ROUNDING  Patient sleeping: No.  Patient alert and oriented: yes  Behavior appropriate: No ; If no, describe: Restless, throwing self about on bed, cursing Nutrition and fluids offered: Yes  Toileting and hygiene offered: Yes  Sitter present: not applicable, Q 15 min safety rounds and observation.  Law enforcement present: Yes ODS

## 2016-02-25 NOTE — ED Notes (Signed)
Charge RN informed of need for Recruitment consultantsafety sitter for patient.

## 2016-02-25 NOTE — ED Notes (Signed)
BEHAVIORAL HEALTH ROUNDING  Patient sleeping: No.  Patient alert and oriented: yes  Behavior appropriate: No ; If no, describe: Restless. Nutrition and fluids offered: Yes  Toileting and hygiene offered: Yes  Sitter present: yes Law enforcement present: Yes ODS

## 2016-02-25 NOTE — ED Notes (Signed)
RN called for second time in search of psychiatrist. Pt still has IVC in place  But also has orders to be discharged. TTS called by this RN and asked if they could page MD to revoke papers. TTS reports they will call back after they reach MD. Pt resting in room and in NAD at this time.

## 2016-02-25 NOTE — ED Notes (Signed)
Pt dressed out by ED tech Alissa; taken to treatment room; pt says before leaving room she's homeless and hasn't eaten in 3 days; requesting something to eat

## 2016-02-25 NOTE — ED Notes (Signed)
Pt refused to have vital signs taken. Carollee HerterShannon RN notified about this.

## 2016-02-25 NOTE — ED Notes (Signed)
BEHAVIORAL HEALTH ROUNDING Patient sleeping: Yes.   Patient alert and oriented: sleeping Behavior appropriate: Yes.  ; If no, describe:  Nutrition and fluids offered: Yes  Toileting and hygiene offered: Yes  Sitter present: q 15 minute checks Law enforcement present: Yes  

## 2016-02-25 NOTE — ED Provider Notes (Signed)
-----------------------------------------   9:23 PM on 02/25/2016 -----------------------------------------   Blood pressure 130/94, pulse 93, temperature 97.8 F (36.6 C), temperature source Oral, resp. rate 20, height 5\' 2"  (1.575 m), weight 120 lb (54.432 kg), last menstrual period 12/17/2007, SpO2 97 %.  The patient had no acute events since last update.  Calm and cooperative at this time.  Patient has been cleared for outpatient treatment by psychiatry. The read that the patient may be taken off of involuntary commitment. I discussed the case with Dr. Ardyth HarpsHernandez at 8:50 PM. She confirmed her note and I took the patient off of the involuntary commitment myself. The patient will be following up with [facility]. She is currently sober. She is denying any suicidal or homicidal intent or ideation at this time.   Myrna Blazeravid Matthew Itzamara Casas, MD 02/25/16 2124

## 2016-02-25 NOTE — ED Notes (Signed)
In room to start IV. Pt refusing to allow this RN to start IV. Will inform MD.
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
## 2016-02-25 NOTE — BH Specialist Note (Signed)
TTS was unable to complete assessment at this time. Patient has been sedated and in unable to participate in the assessment.

## 2016-02-25 NOTE — ED Notes (Signed)
TTS and RN in room to attempt to wake pt up for assessment. Pt woke with a sternal rub and then began responding to voice. Pt reported that she felt awake enough for assessment but fell asleep after the first question. Pt has been sat up in bed and lights are on in attempt to wake pt further for psych exam. Pt has lunch tray per her request after she state "I am hungry" however pt has fallen back to sleep without eating.

## 2016-02-25 NOTE — ED Notes (Signed)
BEHAVIORAL HEALTH ROUNDING Patient sleeping: Yes.   Patient alert and oriented: yes Behavior appropriate: Yes.  ; If no, describe:  Nutrition and fluids offered: Yes  Toileting and hygiene offered: Yes  Sitter present: q 15 minute checks Law enforcement present: Yes

## 2016-02-25 NOTE — ED Notes (Signed)
In room to assess patient. Pt difficult to arouse. Pt asking for medication for her back pain and something to eat. Explained to patient that doctor would need to see her before getting any medications. Pt cursing and not wanting to answer questions. Pt does admit to taking "some pills" today and to using crack cocaine last night.
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
## 2016-02-25 NOTE — ED Notes (Signed)
Schavitzs, MD spoke to Psychiatrist and pt will have IVC revoked and will be discharged after that is completed. Pt verbalized desire to go home and is awake enough to ambulate at this itme. Pt in NAD and cooperative and calm.

## 2016-02-25 NOTE — ED Notes (Addendum)
BEHAVIORAL HEALTH ROUNDING  Patient sleeping: No.  Patient alert and oriented: yes  Behavior appropriate: No ; If no, describe: Restless, throwing self about on bed, cursing Nutrition and fluids offered: Yes  Toileting and hygiene offered: Yes  Sitter present: yes Law enforcement present: Yes ODS  

## 2016-02-25 NOTE — ED Notes (Addendum)
BEHAVIORAL HEALTH ROUNDING  Patient sleeping: No.  Patient alert and oriented: yes  Behavior appropriate: No ; If no, describe: Restless, throwing self about on bed, cursing Nutrition and fluids offered: Yes  Toileting and hygiene offered: Yes  Sitter present: yes Law enforcement present: Yes ODS

## 2016-02-25 NOTE — BH Specialist Note (Signed)
TTS attempted to speak with Nicole Chandler, She appeared disoriented and with unclear speech

## 2016-02-25 NOTE — Progress Notes (Signed)
Spoke with pt. Nurse, Carollee HerterShannon and tried to get a response form pt., but pt. Went back to sleep and was unresponsive. Isaul Landi K. Sherlon HandingHarris, LCAS-A, LPC-A, [facility]  Counselor 02/25/2016 12:56 PM

## 2016-02-25 NOTE — Progress Notes (Signed)
Attempted assessment, patient stated she would answer questions and awoke for a moment then went back to sleep and would not respond. Spoke with pt RN Tobi Bastosnna, and told her to contact [facility] counselor when pt. Is alert and responsive. Rod Majerus K. Keionte Swicegood, LCAS-A, LPC-A, NCC  Counselor 02/25/2016 10:30 AM

## 2016-02-25 NOTE — Progress Notes (Signed)
Patient refused outpatient resources and states that she will follow up with [facility], but would not elaborate further. Joh Rao K. Sherlon HandingHarris, LCAS-A, LPC-A, [facility]  Counselor 02/25/2016 6:18 PM

## 2016-02-25 NOTE — Discharge Instructions (Signed)
Drug Overdose °Drug overdose happens when you take too much of a drug. An overdose can occur with illegal drugs, prescription drugs, or over-the-counter (OTC) drugs. °The effects of drug overdose can be mild, dangerous, or even deadly. °CAUSES °Drug overdose may be caused by: °· Taking too much of a drug on purpose. °· Taking too much of a drug by accident. °· An error made by a health care provider who prescribes a drug. °· An error made by a pharmacist who fills the prescription order. °Drugs that commonly cause overdose include: °· Mental health drugs. °· Pain medicines. °· Illegal drugs. °· OTC cough and cold medicines. °· Heart medicines. °· Seizure medicines. °RISK FACTORS °Drug overdose is more likely in: °· Children. They may be attracted to colorful pills. Because of children's small size, even a small amount of a drug can be dangerous. °· Elderly people. They may be taking many different drugs. Elderly people may have difficulty reading labels or remembering when they last took their medicine. °The risk of drug overdose is also higher for someone who: °· Takes illegal drugs. °· Takes a drug and drinks alcohol. °· Has a mental health condition. °SYMPTOMS °Signs and symptoms of drug overdose depend on the drug and the amount that was taken. Common danger signs include: °· Behavior changes. °· Sleepiness. °· Slowed breathing. °· Nausea and vomiting. °· Seizures. °· Changes in eye pupil size (very large or very small). °If there are signs of very low blood pressure from a drug overdose (shock), emergency treatment is required. These signs include: °· Cold and clammy skin. °· Pale skin. °· Blue lips. °· Very slow breathing. °· Extreme sleepiness. °· Loss of consciousness. °DIAGNOSIS °Drug overdose may be diagnosed based on your symptoms. It is important that you tell your health care provider: °· All of the drugs that you have taken. °· When you took the drugs. °· Whether you were drinking alcohol. °Your health  care provider will do a physical exam. This exam may include: °· Checking and monitoring your heart rate and rhythm, your temperature, and your blood pressure (vital signs). °· Checking your breathing and oxygen level. °You may also have tests, including:  °· Urine tests to check for drugs in your system. °· Blood tests to check for: °¨ Drugs in your system. °¨ Signs of an imbalance of your blood minerals (electrolytes). °¨ Liver damage. °¨ Kidney damage. °TREATMENT °Supporting your vital signs and your breathing is the first step in treating a drug overdose. Treatment may also include: °· Receiving fluids and electrolytes through an IV tube. °· Having a breathing tube (endotracheal tube) inserted in your airway to help you breathe. °· Having a tube passed through your nose and into your stomach (nasogastric tube) to wash out your stomach. °· Medicines. You may get medicines to: °¨ Make you vomit. °¨ Absorb any medicine that is left in your digestive system (activated charcoal). °¨ Block or reverse the effect of the drug that caused the overdose. °· Having your blood filtered through an artificial kidney machine (hemodialysis). You may need this if your overdose is severe or if you have kidney failure. °· Having ongoing counseling and mental health support if you intentionally overdosed or used an illegal drug. °HOME CARE INSTRUCTIONS °· Take medicines only as directed by your health care provider. Always ask your health care provider to discuss the possible side effects of any new drug that you start taking. °· Keep a list of all of the drugs   that you take, including over-the-counter medicines. Bring this list with you to all of your medical visits.  Read the drug inserts that come with your medicines.  Do not use illegal drugs.  Do not drink alcohol when taking drugs.  Store all medicines in safety containers that are out of the reach of children.  Keep the phone number of your local poison control  center near your phone or on your cell phone.  Get help if you are struggling with alcohol or drug use.  Get help if you are struggling with depression or another mental health problem.  Keep all follow-up visits as directed by your health care provider. This is important. SEEK MEDICAL CARE IF:  Your symptoms return.  You develop any new signs or symptoms when you are taking medicines. SEEK IMMEDIATE MEDICAL CARE IF:  You think that you or someone else may have taken too much of a drug. The hotline of the [facility] is [phone]) [phone].  You or someone else is having symptoms of a drug overdose.  You have serious thoughts about hurting yourself or others.  You have chest pain.  You have difficulty breathing.  You have a loss of consciousness. Drug overdose is an emergency. Do not wait to see if the symptoms will go away. Get medical help right away. Call your local emergency services (911 in the [place] Do not drive yourself to the hospital.   This information is not intended to replace advice given to you by your health care provider. Make sure you discuss any questions you have with your health care provider.   Document Released: 12/26/2014 Document Reviewed: 12/26/2014 Elsevier Interactive Patient Education [employer]2016 Elsevier Inc.  Polysubstance Abuse When people abuse more than one drug or type of drug it is called polysubstance or polydrug abuse. For example, many smokers also drink alcohol. This is one form of polydrug abuse. Polydrug abuse also refers to the use of a drug to counteract an unpleasant effect produced by another drug. It may also be used to help with withdrawal from another drug. People who take stimulants may become agitated. Sometimes this agitation is countered with a tranquilizer. This helps protect against the unpleasant side effects. Polydrug abuse also refers to the use of different drugs at the same time.  Anytime drug use is interfering  with normal living activities, it has become abuse. This includes problems with family and friends. Psychological dependence has developed when your mind tells you that the drug is needed. This is usually followed by physical dependence which has developed when continuing increases of drug are required to get the same feeling or "high". This is known as addiction or chemical dependency. A person's risk is much higher if there is a history of chemical dependency in the family. SIGNS OF CHEMICAL DEPENDENCY  You have been told by friends or family that drugs have become a problem.  You fight when using drugs.  You are having blackouts (not remembering what you do while using).  You feel sick from using drugs but continue using.  You lie about use or amounts of drugs (chemicals) used.  You need chemicals to get you going.  You are suffering in work performance or in school because of drug use.  You get sick from use of drugs but continue to use anyway.  You need drugs to relate to people or feel comfortable in social situations.  You use drugs to forget problems. "Yes" answered to any of the  above signs of chemical dependency indicates there are problems. The longer the use of drugs continues, the greater the problems will become. If there is a family history of drug or alcohol use, it is best not to experiment with these drugs. Continual use leads to tolerance. After tolerance develops more of the drug is needed to get the same feeling. This is followed by addiction. With addiction, drugs become the most important part of life. It becomes more important to take drugs than participate in the other usual activities of life. This includes relating to friends and family. Addiction is followed by dependency. Dependency is a condition where drugs are now needed not just to get high, but to feel normal. Addiction cannot be cured but it can be stopped. This often requires outside help and the care of  professionals. Treatment centers are listed in the yellow pages under: Cocaine, Narcotics, and Alcoholics Anonymous. Most hospitals and clinics can refer you to a specialized care center. Talk to your caregiver if you need help.   This information is not intended to replace advice given to you by your health care provider. Make sure you discuss any questions you have with your health care provider.   Document Released: 04/02/2005 Document Revised: 11/03/2011 Document Reviewed: 08/16/2014 Elsevier Interactive Patient Education [employer]2016 Elsevier Inc.

## 2016-02-25 NOTE — ED Notes (Signed)
BEHAVIORAL HEALTH ROUNDING  Patient sleeping: No.  Patient alert and oriented: yes  Behavior appropriate: No ; If no, describe: Restless, throwing self about on bed, cursing Nutrition and fluids offered: Yes  Toileting and hygiene offered: Yes  Sitter present: yes Law enforcement present: Yes ODS  

## 2016-02-25 NOTE — ED Notes (Signed)
In room with Dr Darnelle CatalanMalinda. Dr Darnelle CatalanMalinda attempting to assess pt and pt yelling, cursing at this RN and the MD. Dr Darnelle CatalanMalinda explained to the pt the need for an IV with some fluids pt still yelling at this RN and the MD. Pt coming off the bed swinging at this RN and the doctor. Verbal orders given for meds.

## 2016-02-27 ENCOUNTER — Emergency Department
Admission: EM | Admit: 2016-02-27 | Discharge: 2016-02-27 | Disposition: A | Discharge status: Home / Self Care | Payer: Self-pay | Attending: Emergency Medicine | Admitting: Emergency Medicine

## 2016-02-27 DIAGNOSIS — M5431 Sciatica, right side: Secondary | ICD-10-CM | POA: Insufficient documentation

## 2016-02-27 DIAGNOSIS — Y999 Unspecified external cause status: Secondary | ICD-10-CM | POA: Insufficient documentation

## 2016-02-27 DIAGNOSIS — F319 Bipolar disorder, unspecified: Secondary | ICD-10-CM | POA: Insufficient documentation

## 2016-02-27 DIAGNOSIS — X58XXXA Exposure to other specified factors, initial encounter: Secondary | ICD-10-CM | POA: Insufficient documentation

## 2016-02-27 DIAGNOSIS — Z7951 Long term (current) use of inhaled steroids: Secondary | ICD-10-CM | POA: Insufficient documentation

## 2016-02-27 DIAGNOSIS — S63501A Unspecified sprain of right wrist, initial encounter: Secondary | ICD-10-CM | POA: Insufficient documentation

## 2016-02-27 DIAGNOSIS — Y9389 Activity, other specified: Secondary | ICD-10-CM | POA: Insufficient documentation

## 2016-02-27 DIAGNOSIS — Y929 Unspecified place or not applicable: Secondary | ICD-10-CM | POA: Insufficient documentation

## 2016-02-27 DIAGNOSIS — F17210 Nicotine dependence, cigarettes, uncomplicated: Secondary | ICD-10-CM | POA: Insufficient documentation

## 2016-02-27 MED ORDER — NAPROXEN 500 MG PO TABS: 500.0000 mg | ORAL_TABLET | Freq: Two times a day (BID) | ORAL | Status: DC

## 2016-02-27 NOTE — ED Notes (Signed)
Pt in with co lower back and right hip pain; h/x of chronic back pain, also c/o right wrist pain, no known trauma or injury.

## 2016-02-27 NOTE — Discharge Instructions (Signed)
Sciatica °Sciatica is pain, weakness, numbness, or tingling along the path of the sciatic nerve. The nerve starts in the lower back and runs down the back of each leg. The nerve controls the muscles in the lower leg and in the back of the knee, while also providing sensation to the back of the thigh, lower leg, and the sole of your foot. Sciatica is a symptom of another medical condition. For instance, nerve damage or certain conditions, such as a herniated disk or bone spur on the spine, pinch or put pressure on the sciatic nerve. This causes the pain, weakness, or other sensations normally associated with sciatica. Generally, sciatica only affects one side of the body. °CAUSES  °· Herniated or slipped disc. °· Degenerative disk disease. °· A pain disorder involving the narrow muscle in the buttocks (piriformis syndrome). °· Pelvic injury or fracture. °· Pregnancy. °· Tumor (rare). °SYMPTOMS  °Symptoms can vary from mild to very severe. The symptoms usually travel from the low back to the buttocks and down the back of the leg. Symptoms can include: °· Mild tingling or dull aches in the lower back, leg, or hip. °· Numbness in the back of the calf or sole of the foot. °· Burning sensations in the lower back, leg, or hip. °· Sharp pains in the lower back, leg, or hip. °· Leg weakness. °· Severe back pain inhibiting movement. °These symptoms may get worse with coughing, sneezing, laughing, or prolonged sitting or standing. Also, being overweight may worsen symptoms. °DIAGNOSIS  °Your caregiver will perform a physical exam to look for common symptoms of sciatica. He or she may ask you to do certain movements or activities that would trigger sciatic nerve pain. Other tests may be performed to find the cause of the sciatica. These may include: °· Blood tests. °· X-rays. °· Imaging tests, such as an MRI or CT scan. °TREATMENT  °Treatment is directed at the cause of the sciatic pain. Sometimes, treatment is not necessary  and the pain and discomfort goes away on its own. If treatment is needed, your caregiver may suggest: °· Over-the-counter medicines to relieve pain. °· Prescription medicines, such as anti-inflammatory medicine, muscle relaxants, or narcotics. °· Applying heat or ice to the painful area. °· Steroid injections to lessen pain, irritation, and inflammation around the nerve. °· Reducing activity during periods of pain. °· Exercising and stretching to strengthen your abdomen and improve flexibility of your spine. Your caregiver may suggest losing weight if the extra weight makes the back pain worse. °· Physical therapy. °· Surgery to eliminate what is pressing or pinching the nerve, such as a bone spur or part of a herniated disk. °HOME CARE INSTRUCTIONS  °· Only take over-the-counter or prescription medicines for pain or discomfort as directed by your caregiver. °· Apply ice to the affected area for 20 minutes, 3-4 times a day for the first 48-72 hours. Then try heat in the same way. °· Exercise, stretch, or perform your usual activities if these do not aggravate your pain. °· Attend physical therapy sessions as directed by your caregiver. °· Keep all follow-up appointments as directed by your caregiver. °· Do not wear high heels or shoes that do not provide proper support. °· Check your mattress to see if it is too soft. A firm mattress may lessen your pain and discomfort. °SEEK IMMEDIATE MEDICAL CARE IF:  °· You lose control of your bowel or bladder (incontinence). °· You have increasing weakness in the lower back, pelvis, buttocks,   or legs.  You have redness or swelling of your back.  You have a burning sensation when you urinate.  You have pain that gets worse when you lie down or awakens you at night.  Your pain is worse than you have experienced in the past.  Your pain is lasting longer than 4 weeks.  You are suddenly losing weight without reason. MAKE SURE YOU:  Understand these  instructions.  Will watch your condition.  Will get help right away if you are not doing well or get worse.   This information is not intended to replace advice given to you by your health care provider. Make sure you discuss any questions you have with your health care provider.   Document Released: 08/05/2001 Document Revised: 05/02/2015 Document Reviewed: 12/21/2011 Elsevier Interactive Patient Education 2016 [employer]  Wrist Sprain  A sprain is an injury in which a ligament that maintains the proper alignment of a joint is partially or completely torn. The ligaments of the wrist are susceptible to sprains. Sprains are classified into three categories. Grade 1 sprains cause pain, but the tendon is not lengthened. Grade 2 sprains include a lengthened ligament because the ligament is stretched or partially ruptured. With grade 2 sprains there is still function, although the function may be diminished. Grade 3 sprains are characterized by a complete tear of the tendon or muscle, and function is usually impaired. SYMPTOMS   Pain tenderness, inflammation, and/or bruising (contusion) of the injury.  A "pop" or tear felt and/or heard at the time of injury.  Decreased wrist function. CAUSES  A wrist sprain occurs when a force is placed on one or more ligaments that is greater than it/they can withstand. Common mechanisms of injury include:  Catching a ball with your hands.  Repetitive and/ or strenuous extension or flexion of the wrist. RISK INCREASES WITH:  Previous wrist injury.  Contact sports (boxing or wrestling).  Activities in which falling is common.  Poor strength and flexibility.  Improperly fitted or padded protective equipment. PREVENTION  Warm up and stretch properly before activity.  Allow for adequate recovery between workouts.  Maintain physical fitness:  Strength, flexibility, and endurance.  Cardiovascular fitness.  Protect the wrist joint by  limiting its motion with the use of taping, braces, or splints.  Protect the wrist after injury for 6 to 12 months. PROGNOSIS  The prognosis for wrist sprains depends on the degree of injury. Grade 1 sprains require 2 to 6 weeks of treatment. Grade 2 sprains require 6 to 8 weeks of treatment, and grade 3 sprains require up to 12 weeks.  RELATED COMPLICATIONS   Prolonged healing time, if improperly treated or re-injured.  Recurrent symptoms that result in a chronic problem.  Injury to nearby structures (bone, cartilage, nerves, or tendons).  Arthritis of the wrist.  Inability to compete in athletics at a high level.  Wrist stiffness or weakness.  Progression to a complete rupture of the ligament. TREATMENT  Treatment initially involves resting from any activities that aggravate the symptoms, and the use of ice and medications to help reduce pain and inflammation. Your caregiver may recommend immobilizing the wrist for a period of time in order to reduce stress on the ligament and allow for healing. After immobilization it is important to perform strengthening and stretching exercises to help regain strength and a full range of motion. These exercises may be completed at home or with a therapist. Surgery is not usually required for wrist sprains, unless  the ligament has been ruptured (grade 3 sprain). MEDICATION   If pain medication is necessary, then nonsteroidal anti-inflammatory medications, such as aspirin and ibuprofen, or other minor pain relievers, such as acetaminophen, are often recommended.  Do not take pain medication for 7 days before surgery.  Prescription pain relievers may be given if deemed necessary by your caregiver. Use only as directed and only as much as you need. HEAT AND COLD  Cold treatment (icing) relieves pain and reduces inflammation. Cold treatment should be applied for 10 to 15 minutes every 2 to 3 hours for inflammation and pain and immediately after any  activity that aggravates your symptoms. Use ice packs or massage the area with a piece of ice (ice massage).  Heat treatment may be used prior to performing the stretching and strengthening activities prescribed by your caregiver, physical therapist, or athletic trainer. Use a heat pack or soak your injury in warm water. SEEK MEDICAL CARE IF:  Treatment seems to offer no benefit, or the condition worsens.  Any medications produce adverse side effects.

## 2016-02-27 NOTE — ED Notes (Signed)
Pt in with co lower back an right hip pain since yest hx of chronic back pain, also co right wrist pain, no injury.
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
## 2016-02-29 ENCOUNTER — Telehealth: Payer: Self-pay | Admitting: Emergency Medicine

## 2016-02-29 NOTE — ED Notes (Signed)
Patient called and says she cannot take the naproxen due to it upsets her stomach and makes her very sick.  She is asking about medication switch to something like gabapentin.  Per dr Cyril Loosenkinner, patietn may take tylenol.  The patient has no pcp and has no plan to get one due to no insuance.  She lives in [address] [place] and i explained that she can be treated for free [facility] if she goes and signs up there.  i told her that they also try to get medicaitons at no charge.  She says she is just going to come back here.

## 2016-03-04 DIAGNOSIS — F17210 Nicotine dependence, cigarettes, uncomplicated: Secondary | ICD-10-CM | POA: Insufficient documentation

## 2016-03-04 DIAGNOSIS — F1110 Opioid abuse, uncomplicated: Secondary | ICD-10-CM | POA: Insufficient documentation

## 2016-03-04 DIAGNOSIS — Z79899 Other long term (current) drug therapy: Secondary | ICD-10-CM | POA: Insufficient documentation

## 2016-03-04 DIAGNOSIS — F329 Major depressive disorder, single episode, unspecified: Secondary | ICD-10-CM | POA: Insufficient documentation

## 2016-03-04 DIAGNOSIS — Z791 Long term (current) use of non-steroidal anti-inflammatories (NSAID): Secondary | ICD-10-CM | POA: Insufficient documentation

## 2016-03-04 LAB — URINALYSIS COMPLETE WITH MICROSCOPIC (ARMC ONLY)
Bacteria, UA: NONE SEEN
Bilirubin Urine: NEGATIVE
Glucose, UA: NEGATIVE mg/dL
Ketones, ur: NEGATIVE mg/dL
Leukocytes, UA: NEGATIVE
Nitrite: NEGATIVE
Protein, ur: NEGATIVE mg/dL
Specific Gravity, Urine: 1.002 — ABNORMAL LOW (ref 1.005–1.030)
Squamous Epithelial / HPF: NONE SEEN
pH: 6 (ref 5.0–8.0)

## 2016-03-04 LAB — CBC
HEMATOCRIT: 46.7 % (ref 35.0–47.0)
HEMOGLOBIN: 15.8 g/dL (ref 12.0–16.0)
MCH: 30.5 pg (ref 26.0–34.0)
MCHC: 33.8 g/dL (ref 32.0–36.0)
MCV: 90.2 fL (ref 80.0–100.0)
Platelets: 400 10*3/uL (ref 150–440)
RBC: 5.18 MIL/uL (ref 3.80–5.20)
RDW: 15 % — ABNORMAL HIGH (ref 11.5–14.5)
WBC: 10.8 10*3/uL (ref 3.6–11.0)

## 2016-03-04 NOTE — ED Notes (Signed)
PT was changed out by EDT Francella SolianSarah Precilla Purnell. Pt belongings were placed in a bag and labeled. Pt has (2) belongings bag,. : 1 bad and 1 backpack. PT has money in the side of the backpack. Pt states amount is around $6 and some loose change. Pt refused to count money and refused for money to be locked up, pt states : "I know how much it is, im not worried about it.... Its fine where it is. I trust yall". Pt was made aware that this would be documented. Pt is calm and cooperative during triage process. RN notified of dressing change

## 2016-03-04 NOTE — ED Notes (Signed)
Pt in with co wanting detox from narcotics also having thoughts of suicide

## 2016-03-05 ENCOUNTER — Emergency Department
Admission: EM | Admit: 2016-03-05 | Discharge: 2016-03-05 | Disposition: A | Discharge status: Home / Self Care | Payer: Self-pay | Attending: Emergency Medicine | Admitting: Emergency Medicine

## 2016-03-05 ENCOUNTER — Observation Stay (HOSPITAL_COMMUNITY): Admission: AD | Admit: 2016-03-05 | Payer: Self-pay | Source: Intra-hospital | Admitting: Psychiatry

## 2016-03-05 DIAGNOSIS — F329 Major depressive disorder, single episode, unspecified: Secondary | ICD-10-CM

## 2016-03-05 DIAGNOSIS — F1120 Opioid dependence, uncomplicated: Secondary | ICD-10-CM | POA: Diagnosis present

## 2016-03-05 DIAGNOSIS — F1110 Opioid abuse, uncomplicated: Secondary | ICD-10-CM

## 2016-03-05 DIAGNOSIS — F1994 Other psychoactive substance use, unspecified with psychoactive substance-induced mood disorder: Secondary | ICD-10-CM

## 2016-03-05 DIAGNOSIS — F32A Depression, unspecified: Secondary | ICD-10-CM

## 2016-03-05 LAB — COMPREHENSIVE METABOLIC PANEL
ALT: 10 U/L — ABNORMAL LOW (ref 14–54)
ANION GAP: 10 (ref 5–15)
AST: 23 U/L (ref 15–41)
Albumin: 5.1 g/dL — ABNORMAL HIGH (ref 3.5–5.0)
Alkaline Phosphatase: 81 U/L (ref 38–126)
BUN: 12 mg/dL (ref 6–20)
CALCIUM: 10.1 mg/dL (ref 8.9–10.3)
CHLORIDE: 100 mmol/L — AB (ref 101–111)
CO2: 28 mmol/L (ref 22–32)
Creatinine, Ser: 1.07 mg/dL — ABNORMAL HIGH (ref 0.44–1.00)
GFR calc non Af Amer: 58 mL/min — ABNORMAL LOW (ref >60)
Glucose, Bld: 84 mg/dL (ref 65–99)
POTASSIUM: 3.3 mmol/L — AB (ref 3.5–5.1)
SODIUM: 138 mmol/L (ref 135–145)
Total Bilirubin: <0.1 mg/dL — ABNORMAL LOW (ref 0.3–1.2)
Total Protein: 8.6 g/dL — ABNORMAL HIGH (ref 6.5–8.1)

## 2016-03-05 LAB — URINE DRUG SCREEN, QUALITATIVE (ARMC ONLY)
AMPHETAMINES, UR SCREEN: NOT DETECTED
BARBITURATES, UR SCREEN: NOT DETECTED
Benzodiazepine, Ur Scrn: NOT DETECTED
COCAINE METABOLITE, UR ~~LOC~~: NOT DETECTED
Cannabinoid 50 Ng, Ur ~~LOC~~: POSITIVE — AB
MDMA (ECSTASY) UR SCREEN: NOT DETECTED
METHADONE SCREEN, URINE: NOT DETECTED
Opiate, Ur Screen: NOT DETECTED
Phencyclidine (PCP) Ur S: NOT DETECTED
TRICYCLIC, UR SCREEN: NOT DETECTED

## 2016-03-05 LAB — ETHANOL: Alcohol, Ethyl (B): <5 mg/dL (ref <5)

## 2016-03-05 MED ORDER — NICOTINE 21 MG/24HR TD PT24
MEDICATED_PATCH | TRANSDERMAL | Status: AC
  Filled 2016-03-05: qty 1

## 2016-03-05 MED ORDER — ACETAMINOPHEN 500 MG PO TABS
1000.0000 mg | ORAL_TABLET | Freq: Once | ORAL | Status: AC
  Administered 2016-03-05: 1000 mg via ORAL

## 2016-03-05 MED ORDER — CLONAZEPAM 0.5 MG PO TABS
0.5000 mg | ORAL_TABLET | Freq: Three times a day (TID) | ORAL | Status: DC
  Administered 2016-03-05 (×3): 0.5 mg via ORAL
  Filled 2016-03-05 (×3): qty 1

## 2016-03-05 MED ORDER — ALBUTEROL SULFATE (2.5 MG/3ML) 0.083% IN NEBU
3.0000 mL | INHALATION_SOLUTION | Freq: Four times a day (QID) | RESPIRATORY_TRACT | Status: DC | PRN
  Filled 2016-03-05: qty 3

## 2016-03-05 MED ORDER — ACETAMINOPHEN 500 MG PO TABS
ORAL_TABLET | ORAL | Status: AC
  Filled 2016-03-05: qty 2

## 2016-03-05 MED ORDER — ACETAMINOPHEN 325 MG PO TABS
650.0000 mg | ORAL_TABLET | Freq: Once | ORAL | Status: AC
  Administered 2016-03-05: 650 mg via ORAL
  Filled 2016-03-05: qty 2

## 2016-03-05 MED ORDER — NICOTINE 21 MG/24HR TD PT24
21.0000 mg | MEDICATED_PATCH | Freq: Once | TRANSDERMAL | Status: DC
  Administered 2016-03-05: 21 mg via TRANSDERMAL

## 2016-03-05 NOTE — ED Notes (Signed)
Patient currently in room speaking with intake. No signs of acute distress noted. Maintained on 15 minute checks and observation by security camera for safety.

## 2016-03-05 NOTE — Discharge Instructions (Signed)
Polysubstance Abuse °When people abuse more than one drug or type of drug it is called polysubstance or polydrug abuse. For example, many smokers also drink alcohol. This is one form of polydrug abuse. Polydrug abuse also refers to the use of a drug to counteract an unpleasant effect produced by another drug. It may also be used to help with withdrawal from another drug. People who take stimulants may become agitated. Sometimes this agitation is countered with a tranquilizer. This helps protect against the unpleasant side effects. Polydrug abuse also refers to the use of different drugs at the same time.  °Anytime drug use is interfering with normal living activities, it has become abuse. This includes problems with family and friends. Psychological dependence has developed when your mind tells you that the drug is needed. This is usually followed by physical dependence which has developed when continuing increases of drug are required to get the same feeling or "high". This is known as addiction or chemical dependency. A person's risk is much higher if there is a history of chemical dependency in the family. °SIGNS OF CHEMICAL DEPENDENCY °· You have been told by friends or family that drugs have become a problem. °· You fight when using drugs. °· You are having blackouts (not remembering what you do while using). °· You feel sick from using drugs but continue using. °· You lie about use or amounts of drugs (chemicals) used. °· You need chemicals to get you going. °· You are suffering in work performance or in school because of drug use. °· You get sick from use of drugs but continue to use anyway. °· You need drugs to relate to people or feel comfortable in social situations. °· You use drugs to forget problems. °"Yes" answered to any of the above signs of chemical dependency indicates there are problems. The longer the use of drugs continues, the greater the problems will become. °If there is a family history of  drug or alcohol use, it is best not to experiment with these drugs. Continual use leads to tolerance. After tolerance develops more of the drug is needed to get the same feeling. This is followed by addiction. With addiction, drugs become the most important part of life. It becomes more important to take drugs than participate in the other usual activities of life. This includes relating to friends and family. Addiction is followed by dependency. Dependency is a condition where drugs are now needed not just to get high, but to feel normal. °Addiction cannot be cured but it can be stopped. This often requires outside help and the care of professionals. Treatment centers are listed in the yellow pages under: Cocaine, Narcotics, and Alcoholics Anonymous. Most hospitals and clinics can refer you to a specialized care center. Talk to your caregiver if you need help. °  °This information is not intended to replace advice given to you by your health care provider. Make sure you discuss any questions you have with your health care provider. °  °Document Released: 04/02/2005 Document Revised: 11/03/2011 Document Reviewed: 08/16/2014 °Elsevier Interactive Patient Education ©2016 [employer] Inc. ° °Please return immediately if condition worsens. Please contact her primary physician or the physician you were given for referral. If you have any specialist physicians involved in her treatment and plan please also contact them. Thank you for using  regional emergency Department. ° °

## 2016-03-05 NOTE — ED Notes (Signed)
Pt requesting detox from opioids, pt reports trying outpatient treatment recently without success. Pt reports marijuana use, but denies use of other illegal narcotics. Pt is A&Ox4, in NAD, calm and cooperative with respirations even, regular, and unlabored.
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
## 2016-03-05 NOTE — ED Notes (Signed)
ENVIRONMENTAL ASSESSMENT Potentially harmful objects out of patient reach: Yes Personal belongings secured: Yes Patient dressed in hospital provided attire only: Yes Plastic bags out of patient reach: Yes Patient care equipment (cords, cables, call bells, lines, and drains) shortened, removed, or accounted for: Yes Equipment and supplies removed from bottom of stretcher: Yes Potentially toxic materials out of patient reach: Yes Sharps container removed or out of patient reach: Yes  Patient in room resting and watching television. No signs of acute distress noted. Maintained on 15 minute checks and observation by security camera for safety.

## 2016-03-05 NOTE — ED Notes (Signed)
Patient was brought to the ED after experiencing depressive thoughts and reminiscing on several deaths that have occurred to those close to her including the deaths of a fiance and close friend. Patient also says that she consistently uses pain medication and requests to be detoxed from out. Patient at this point denies SI/HI/AVH. Patient says that she feels pain "all over" and says that only gabapentin works to relieve it. She rates her pain a 8/10. Patient recently received tylenol to treat pain. Patient is calm and cooperative and shows no signs of acute distress. Maintained on 15 minute checks and observation by security camera for safety.

## 2016-03-05 NOTE — Consult Note (Signed)
[name] Face-to-Face Psychiatry Consult   Reason for Consult:  Consult for 55 year old woman with a history of opiate abuse and depressive symptoms who comes in requesting detox. Referring Physician:  Edd Fabian Patient Identification: Nicole Chandler MRN:  [id number] Principal Diagnosis: Substance induced mood disorder [facility]) Diagnosis:   Patient Active Problem List   Diagnosis Date Noted  . Substance induced mood disorder ([place]) [F19.94] 03/05/2016  . Opioid use disorder, severe, dependence ([place]) [F11.20] 02/25/2016  . Cocaine use disorder, moderate, dependence ([place]) [F14.20] 02/25/2016  . Tobacco use disorder [F17.200] 02/25/2016  . Opioid-induced mood disorder ([place]) [F11.94] 02/25/2016  . Cannabis use disorder, moderate, dependence ([place]) [F12.20] 02/25/2016    Total Time spent with patient: 1 hour  Subjective:   Nicole Chandler is a 55 y.o. female patient admitted with "I've got to get off of the Percocet".  HPI:  Patient interviewed. Chart reviewed. Old notes reviewed. 55 year old woman comes in stating that she's been abusing Percocet. Uses about 4 of the 10 mg strength pills per day. Has been doing so for months. Also smoking marijuana regularly. Denies any alcohol abuse. Mood feels depressed and down. She is crying a lot of the time. Sleep is poor. Energy level poor. Feels stressed out by her home situation. Patient says she had had some passive thoughts about dying but has no actual intent or plan to kill her self. No psychosis no homicidal ideation. Not currently involved in any outpatient psychiatric treatment.  Medical history: No other ongoing medical problems  Social history: Patient has been living with some extended family. Not currently working.  Substance abuse history: Long history of abuse of multiple substances mostly opiates and cocaine.  Past Psychiatric History: Patient has Probation officer psychiatric admissions. She says she has a distant history of an overdose but no recent suicidal  behavior. No history of psychosis. Had been prescribed antidepressants in the past but had not been compliant.  Risk to Self: Suicidal Ideation: No-Not Currently/Within Last 6 Months Suicidal Intent: No Is patient at risk for suicide?: No Suicidal Plan?: No Access to Means: No What has been your use of drugs/alcohol within the last 12 months?: daily use of marijuana and percocet How many times?: 1 Other Self Harm Risks: denied Triggers for Past Attempts: Unknown Intentional Self Injurious Behavior: None Risk to Others: Homicidal Ideation: No Thoughts of Harm to Others: No Current Homicidal Intent: No Current Homicidal Plan: No Access to Homicidal Means: No Identified Victim: None identified History of harm to others?: No Assessment of Violence: None Noted Does patient have access to weapons?: No Criminal Charges Pending?: No Does patient have a court date: No Prior Inpatient Therapy: Prior Inpatient Therapy: Yes Prior Therapy Dates: 2013 Prior Therapy Facilty/Provider(s): [facility] Reason for Treatment: Depression Prior Outpatient Therapy: Prior Outpatient Therapy: Yes Prior Therapy Dates: current Prior Therapy Facilty/Provider(s): [facility] Reason for Treatment: Depression Does patient have an ACCT team?: No Does patient have Intensive In-House Services?  : No Does patient have [facility] services? : No Does patient have P4CC services?: No  Past Medical History:  Past Medical History  Diagnosis Date  . Chronic back pain unk  . Depression   . Bipolar 1 disorder ([place])   . Panic attacks     Past Surgical History  Procedure Laterality Date  . Eye surgery    . Abdominal surgery     Family History:  Family History  Problem Relation Age of Onset  . Alzheimer's disease Mother   . AAA (abdominal aortic aneurysm) Father   .  Breast cancer Sister 72   Family Psychiatric  History: Multiple people in her family with substance abuse problems Social History:  History  Alcohol Use No      History  Drug Use  . Yes  . Special: Marijuana, Cocaine    Comment: cocaine last night, marijuana last night    Social History   Social History  . Marital Status: Single    Spouse Name: N/A  . Number of Children: N/A  . Years of Education: N/A   Social History Main Topics  . Smoking status: Current Some Day Smoker -- 0.50 packs/day    Types: Cigarettes    Last Attempt to Quit: 08/27/2014  . Smokeless tobacco: Not on file  . Alcohol Use: No  . Drug Use: Yes    Special: Marijuana, Cocaine     Comment: cocaine last night, marijuana last night  . Sexual Activity: Not Currently   Other Topics Concern  . Not on file   Social History Narrative   Additional Social History:    Allergies:  No Known Allergies  Labs:  Results for orders placed or performed during the hospital encounter of 03/05/16 (from the past 48 hour(s))  CBC     Status: Abnormal   Collection Time: 03/04/16 11:05 PM  Result Value Ref Range   WBC 10.8 3.6 - 11.0 K/uL   RBC 5.18 3.80 - 5.20 MIL/uL   Hemoglobin 15.8 12.0 - 16.0 g/dL   HCT 46.7 35.0 - 47.0 %   MCV 90.2 80.0 - 100.0 fL   MCH 30.5 26.0 - 34.0 pg   MCHC 33.8 32.0 - 36.0 g/dL   RDW 15.0 (H) 11.5 - 14.5 %   Platelets 400 150 - 440 K/uL  Comprehensive metabolic panel     Status: Abnormal   Collection Time: 03/04/16 11:05 PM  Result Value Ref Range   Sodium 138 135 - 145 mmol/L   Potassium 3.3 (L) 3.5 - 5.1 mmol/L   Chloride 100 (L) 101 - 111 mmol/L   CO2 28 22 - 32 mmol/L   Glucose, Bld 84 65 - 99 mg/dL   BUN 12 6 - 20 mg/dL   Creatinine, Ser 1.07 (H) 0.44 - 1.00 mg/dL   Calcium 10.1 8.9 - 10.3 mg/dL   Total Protein 8.6 (H) 6.5 - 8.1 g/dL   Albumin 5.1 (H) 3.5 - 5.0 g/dL   AST 23 15 - 41 U/L   ALT 10 (L) 14 - 54 U/L   Alkaline Phosphatase 81 38 - 126 U/L   Total Bilirubin <0.1 (L) 0.3 - 1.2 mg/dL   GFR calc non Af Amer 58 (L) >60 mL/min   GFR calc Af Amer >60 >60 mL/min    Comment: (NOTE) The eGFR has been calculated using the CKD  EPI equation. This calculation has not been validated in all clinical situations. eGFR's persistently <60 mL/min signify possible Chronic Kidney Disease.    Anion gap 10 5 - 15  Ethanol     Status: None   Collection Time: 03/04/16 11:05 PM  Result Value Ref Range   Alcohol, Ethyl (B) <5 <5 mg/dL    Comment:        LOWEST DETECTABLE LIMIT FOR SERUM ALCOHOL IS 5 mg/dL FOR MEDICAL PURPOSES ONLY   Urinalysis complete, with microscopic ([facility] only)     Status: Abnormal   Collection Time: 03/04/16 11:05 PM  Result Value Ref Range   Color, Urine COLORLESS (A) YELLOW   APPearance CLEAR (A) CLEAR  Glucose, UA NEGATIVE NEGATIVE mg/dL   Bilirubin Urine NEGATIVE NEGATIVE   Ketones, ur NEGATIVE NEGATIVE mg/dL   Specific Gravity, Urine 1.002 (L) 1.005 - 1.030   Hgb urine dipstick 1+ (A) NEGATIVE   pH 6.0 5.0 - 8.0   Protein, ur NEGATIVE NEGATIVE mg/dL   Nitrite NEGATIVE NEGATIVE   Leukocytes, UA NEGATIVE NEGATIVE   RBC / HPF 0-5 0 - 5 RBC/hpf   WBC, UA 0-5 0 - 5 WBC/hpf   Bacteria, UA NONE SEEN NONE SEEN   Squamous Epithelial / LPF NONE SEEN NONE SEEN  Urine Drug Screen, Qualitative ([facility] only)     Status: Abnormal   Collection Time: 03/04/16 11:05 PM  Result Value Ref Range   Tricyclic, Ur Screen NONE DETECTED NONE DETECTED   Amphetamines, Ur Screen NONE DETECTED NONE DETECTED   MDMA (Ecstasy)Ur Screen NONE DETECTED NONE DETECTED   Cocaine Metabolite,Ur [place] NONE DETECTED NONE DETECTED   Opiate, Ur Screen NONE DETECTED NONE DETECTED   Phencyclidine (PCP) Ur S NONE DETECTED NONE DETECTED   Cannabinoid 50 Ng, Ur [place] POSITIVE (A) NONE DETECTED   Barbiturates, Ur Screen NONE DETECTED NONE DETECTED   Benzodiazepine, Ur Scrn NONE DETECTED NONE DETECTED   Methadone Scn, Ur NONE DETECTED NONE DETECTED    Comment: (NOTE) 175  Tricyclics, urine               Cutoff 1000 ng/mL 200  Amphetamines, urine             Cutoff 1000 ng/mL 300  MDMA (Ecstasy), urine           Cutoff 500 ng/mL 400   Cocaine Metabolite, urine       Cutoff 300 ng/mL 500  Opiate, urine                   Cutoff 300 ng/mL 600  Phencyclidine (PCP), urine      Cutoff 25 ng/mL 700  Cannabinoid, urine              Cutoff 50 ng/mL 800  Barbiturates, urine             Cutoff 200 ng/mL 900  Benzodiazepine, urine           Cutoff 200 ng/mL 1000 Methadone, urine                Cutoff 300 ng/mL 1100 1200 The urine drug screen provides only a preliminary, unconfirmed 1300 analytical test result and should not be used for non-medical 1400 purposes. Clinical consideration and professional judgment should 1500 be applied to any positive drug screen result due to possible 1600 interfering substances. A more specific alternate chemical method 1700 must be used in order to obtain a confirmed analytical result.  1800 Gas chromato graphy / mass spectrometry (GC/MS) is the preferred 1900 confirmatory method.     Current Facility-Administered Medications  Medication Dose Route Frequency Provider Last Rate Last Dose  . albuterol (PROVENTIL) (2.5 MG/3ML) 0.083% nebulizer solution 3 mL  3 mL Inhalation Q6H PRN Joanne Gavel, MD      . clonazePAM Bobbye [place]) tablet 0.5 mg  0.5 mg Oral TID Joanne Gavel, MD   0.5 mg at 03/05/16 1103  . nicotine (NICODERM CQ - dosed in mg/24 hours) patch 21 mg  21 mg Transdermal Once Joanne Gavel, MD   21 mg at 03/05/16 1356   Current Outpatient Prescriptions  Medication Sig Dispense Refill  . albuterol (PROVENTIL HFA;VENTOLIN HFA) 108 (90 Base)  MCG/ACT inhaler Inhale 1-2 puffs into the lungs every 6 (six) hours as needed for wheezing or shortness of breath. 1 Inhaler 0  . BIOTIN PO Take 1 tablet by mouth daily.    . cyclobenzaprine (FLEXERIL) 5 MG tablet 1 tablet every 8 hours as needed for muscle spasms 15 tablet 0  . ibuprofen (ADVIL,MOTRIN) 600 MG tablet Take 1 tablet (600 mg total) by mouth every 8 (eight) hours as needed. 15 tablet 0  . naproxen (NAPROSYN) 500 MG tablet Take 1 tablet (500  mg total) by mouth 2 (two) times daily with a meal. 28 tablet 0  . clonazePAM (KLONOPIN) 0.5 MG tablet Take 0.5 mg by mouth 3 (three) times daily.    . Multiple Vitamins-Minerals (MULTIVITAMIN WITH MINERALS) tablet Take 1 tablet by mouth daily.      Musculoskeletal: Strength & Muscle Tone: within normal limits Gait & Station: normal Patient leans: N/A  Psychiatric Specialty Exam: Physical Exam  Nursing note and vitals reviewed. Constitutional: She appears well-developed and well-nourished.  HENT:  Head: Normocephalic and atraumatic.  Eyes: Conjunctivae are normal. Pupils are equal, round, and reactive to light.  Neck: Normal range of motion.  Cardiovascular: Regular rhythm and normal heart sounds.   Respiratory: Effort normal. No respiratory distress.  GI: Soft.  Musculoskeletal: Normal range of motion.  Neurological: She is alert.  Skin: Skin is warm and dry.  Psychiatric: Her mood appears anxious. Her speech is delayed. She is slowed. Cognition and memory are normal. She expresses impulsivity. She exhibits a depressed mood. She expresses no suicidal plans.    Review of Systems  Constitutional: Negative.   HENT: Negative.   Eyes: Negative.   Respiratory: Negative.   Cardiovascular: Negative.   Gastrointestinal: Negative.   Musculoskeletal: Negative.   Skin: Negative.   Neurological: Negative.   Psychiatric/Behavioral: Positive for depression and substance abuse. Negative for suicidal ideas, hallucinations and memory loss. The patient is nervous/anxious. The patient does not have insomnia.     Blood pressure 122/84, pulse 57, temperature 97.8 F (36.6 C), temperature source Oral, resp. rate 18, height _0  (1.575 m), weight 57.607 kg (127 lb), last menstrual period 12/17/2007, SpO2 99 %.Body mass index is 23.22 kg/(m^2).  General Appearance: Disheveled  Eye Contact:  Fair  Speech:  Slow  Volume:  Decreased  Mood:  Dysphoric  Affect:  Depressed  Thought Process:  Goal  Directed  Orientation:  Full (Time, Place, and Person)  Thought Content:  Logical  Suicidal Thoughts:  Yes.  without intent/plan  Homicidal Thoughts:  No  Memory:  Immediate;   Good Recent;   Good Remote;   Good  Judgement:  Fair  Insight:  Fair  Psychomotor Activity:  Decreased  Concentration:  Concentration: Fair  Recall:  [employer] of Knowledge:  Fair  Language:  Fair  Akathisia:  No  Handed:  Right  AIMS (if indicated):     Assets:  Communication Skills Desire for Improvement Housing Physical Health  ADL's:  Intact  Cognition:  WNL  Sleep:        Treatment Plan Summary: Plan Patient had some passive suicidal thoughts but without any intent or plan. Does not want to die. Has multiple positive thing she is focused on in the future. Mainly wants to get treatment getting off of opiates. Patient does not require inpatient psychiatric treatment but would benefit from brief detox. Recommended to TTS that we refer this patient to an observation bed. Referral is in progress. Supportive counseling and  review of plan with the patient. Current vital stable no need for any change to medicine.  Disposition: Patient does not meet criteria for psychiatric inpatient admission. Supportive therapy provided about ongoing stressors.  Alethia Berthold, MD 03/05/2016 3:36 PM

## 2016-03-05 NOTE — ED Notes (Signed)
After speaking with intake and nursing staff, patient states that she is no longer interested in going to [facility] or the observation unit and that she would like to go back home. She says that she will be able to fare better with gabapentin 800 mg a day for pain. Staff attempting to reach physician to discuss patient wishes.

## 2016-03-05 NOTE — ED Provider Notes (Signed)
[facility] Emergency Department Provider Note    ____________________________________________  Time seen: ~0135  I have reviewed the triage vital signs and the nursing notes.   HISTORY  Chief Complaint Suicidal   History limited by: Not Limited   HPI Nicole Chandler is a 55 y.o. female who presents to the emergency department today because of concerns for opiate detox. The patient states that she has been abusing opiates for the past 3 months. She says that she last used three days ago. Has had to go to rehab facility roughly 2 years ago for opioid withdrawal. In addition the patient has been having issues with depression and suicidal ideation. The patient says that she has seen a therapist and has been prescribed medications however that was a number of years ago. Patient denies any recent fevers, chest pain or shortness of breath.    Past Medical History  Diagnosis Date  . Chronic back pain unk  . Depression   . Bipolar 1 disorder (HCC)   . Panic attacks     Patient Active Problem List   Diagnosis Date Noted  . Opioid use disorder, severe, dependence (HCC) 02/25/2016  . Cocaine use disorder, moderate, dependence (HCC) 02/25/2016  . Tobacco use disorder 02/25/2016  . Opioid-induced mood disorder (HCC) 02/25/2016  . Cannabis use disorder, moderate, dependence (HCC) 02/25/2016    Past Surgical History  Procedure Laterality Date  . Eye surgery    . Abdominal surgery      Current Outpatient Rx  Name  Route  Sig  Dispense  Refill  . albuterol (PROVENTIL HFA;VENTOLIN HFA) 108 (90 Base) MCG/ACT inhaler   Inhalation   Inhale 1-2 puffs into the lungs every 6 (six) hours as needed for wheezing or shortness of breath.   1 Inhaler   0   . BIOTIN PO   Oral   Take 1 tablet by mouth daily.         . clonazePAM (KLONOPIN) 0.5 MG tablet   Oral   Take 0.5 mg by mouth 3 (three) times daily.         . cyclobenzaprine (FLEXERIL) 5 MG tablet       1 tablet every 8 hours as needed for muscle spasms   15 tablet   0   . ibuprofen (ADVIL,MOTRIN) 600 MG tablet   Oral   Take 1 tablet (600 mg total) by mouth every 8 (eight) hours as needed.   15 tablet   0   . Multiple Vitamins-Minerals (MULTIVITAMIN WITH MINERALS) tablet   Oral   Take 1 tablet by mouth daily.         . naproxen (NAPROSYN) 500 MG tablet   Oral   Take 1 tablet (500 mg total) by mouth 2 (two) times daily with a meal.   28 tablet   0     Allergies Review of patient's allergies indicates no known allergies.  Family History  Problem Relation Age of Onset  . Alzheimer's disease Mother   . AAA (abdominal aortic aneurysm) Father   . Breast cancer Sister [id number]    Social History Social History  Substance Use Topics  . Smoking status: Current Some Day Smoker -- 0.50 packs/day    Types: Cigarettes    Last Attempt to Quit: 08/27/2014  . Smokeless tobacco: Not on file  . Alcohol Use: No    Review of Systems  Constitutional: Negative for fever. Cardiovascular: Negative for chest pain. Respiratory: Negative for shortness of breath. Gastrointestinal: Negative  for abdominal pain, vomiting and diarrhea. Neurological: Negative for headaches, focal weakness or numbness.  10-point ROS otherwise negative.  ____________________________________________   PHYSICAL EXAM:  VITAL SIGNS: ED Triage Vitals  Enc Vitals Group     BP 03/04/16 2256 139/81 mmHg     Pulse Rate 03/04/16 2256 60     Resp 03/04/16 2256 18     Temp 03/04/16 2256 98.4 F (36.9 C)     Temp Source 03/04/16 2256 Oral     SpO2 03/04/16 2256 100 %     Weight 03/04/16 2256 127 lb (57.607 kg)     Height 03/04/16 2256  (1.575 m)     Head Cir --      Peak Flow --      Pain Score 03/04/16 2259 7   Constitutional: Alert and oriented. Well appearing and in no distress. Eyes: Conjunctivae are normal. PERRL. Normal extraocular movements. ENT   Head: Normocephalic and atraumatic.   Nose:  No congestion/rhinnorhea.   Mouth/Throat: Mucous membranes are moist.   Neck: No stridor. Hematological/Lymphatic/Immunilogical: No cervical lymphadenopathy. Cardiovascular: Normal rate, regular rhythm.  No murmurs, rubs, or gallops. Respiratory: Normal respiratory effort without tachypnea nor retractions. Breath sounds are clear and equal bilaterally. No wheezes/rales/rhonchi. Gastrointestinal: Soft and nontender. No distention. There is no CVA tenderness. Genitourinary: Deferred Musculoskeletal: Normal range of motion in all extremities. No joint effusions.  No lower extremity tenderness nor edema. Neurologic:  Normal speech and language. No gross focal neurologic deficits are appreciated.  Skin:  Skin is warm, dry and intact. No rash noted. Psychiatric: Mood and affect are normal. Speech and behavior are normal. Patient exhibits appropriate insight and judgment.  ____________________________________________    LABS (pertinent positives/negatives)  Labs Reviewed  CBC - Abnormal; Notable for the following:    RDW 15.0 (*)    All other components within normal limits  COMPREHENSIVE METABOLIC PANEL - Abnormal; Notable for the following:    Potassium 3.3 (*)    Chloride 100 (*)    Creatinine, Ser 1.07 (*)    Total Protein 8.6 (*)    Albumin 5.1 (*)    ALT 10 (*)    Total Bilirubin <0.1 (*)    GFR calc non Af Amer 58 (*)    All other components within normal limits  URINALYSIS COMPLETEWITH MICROSCOPIC ([facility] ONLY) - Abnormal; Notable for the following:    Color, Urine COLORLESS (*)    APPearance CLEAR (*)    Specific Gravity, Urine 1.002 (*)    Hgb urine dipstick 1+ (*)    All other components within normal limits  URINE DRUG SCREEN, QUALITATIVE ([facility] ONLY) - Abnormal; Notable for the following:    Cannabinoid 50 Ng, Ur Magnet POSITIVE (*)    All other components within normal limits  ETHANOL      ____________________________________________   EKG  None  ____________________________________________    RADIOLOGY  None  ____________________________________________   PROCEDURES  Procedure(s) performed: None  Critical Care performed: No  ____________________________________________   INITIAL IMPRESSION / ASSESSMENT AND PLAN / ED COURSE  Pertinent labs & imaging results that were available during my care of the patient were reviewed by me and considered in my medical decision making (see chart for details).  Patient presented to the emergency department today because of desire to detox from opiates however patient also expressing thoughts of wanting to harm herself and depression. Patient is willing to speak with psychiatry. Will have behavioral health specialist evaluate the patient as well.  ____________________________________________   FINAL CLINICAL IMPRESSION(S) / ED DIAGNOSES  Final diagnoses:  Depression  Opioid abuse     Note: This dictation was prepared with Dragon dictation. Any transcriptional errors that result from this process are unintentional    Phineas Semen, MD 03/05/16 [phone]

## 2016-03-05 NOTE — ED Provider Notes (Signed)
-----------------------------------------   5:56 PM on 03/05/2016 -----------------------------------------   Blood pressure 123/77, pulse 85, temperature 98.2 F (36.8 C), temperature source Oral, resp. rate 18, height 5\' 2"  (1.575 m), weight 127 lb (57.607 kg), last menstrual period 12/17/2007, SpO2 97 %.  The patient had no acute events since last update.  Calm and cooperative at this time.  Disposition is pending per Psychiatry/Behavioral Medicine team recommendations.   Patient apparently is declining on transfer to or for inpatient drug treatment. She was recommended by psychiatry that she could be discharged. She was referred locally to our [facility] services. Patient should follow-up wherever she gets her gabapentin filled.  Jennye MoccasinBrian S Quigley, MD 03/05/16 [phone]

## 2016-03-05 NOTE — ED Notes (Signed)
Patient currently in room tearful. Patient says that she is scared to go home but maintains that she does not wish to go to another treatment facility. Nurse provided reassurance. Patient became more calm and cooperative. Maintained on 15 minute checks and observation by security camera for safety.

## 2016-03-05 NOTE — Progress Notes (Signed)
[facility] but declined the offer and was explained and offered additional resources, and patient refused. Per ER Dr. Jannet MantisQ, pt can be d/c with resources for [facility]. Jane Birkel K. Sherlon HandingHarris, LCAS-A, LPC-A, [facility]  Counselor 03/05/2016 6:04 PM

## 2016-03-05 NOTE — ED Notes (Signed)
Report was received from Karena T., RN; Pt. Verbalizes no complaints or distress; denies S.I./Hi. Continue to monitor with 15 min. Monitoring. 

## 2016-03-08 ENCOUNTER — Emergency Department: Payer: Self-pay

## 2016-03-08 ENCOUNTER — Emergency Department
Admission: EM | Admit: 2016-03-08 | Discharge: 2016-03-09 | Disposition: A | Discharge status: Home / Self Care | Payer: Self-pay | Attending: Emergency Medicine | Admitting: Emergency Medicine

## 2016-03-08 ENCOUNTER — Encounter: Payer: Self-pay | Admitting: Emergency Medicine

## 2016-03-08 DIAGNOSIS — Z79899 Other long term (current) drug therapy: Secondary | ICD-10-CM | POA: Insufficient documentation

## 2016-03-08 DIAGNOSIS — F1490 Cocaine use, unspecified, uncomplicated: Secondary | ICD-10-CM | POA: Insufficient documentation

## 2016-03-08 DIAGNOSIS — F319 Bipolar disorder, unspecified: Secondary | ICD-10-CM | POA: Insufficient documentation

## 2016-03-08 DIAGNOSIS — F1290 Cannabis use, unspecified, uncomplicated: Secondary | ICD-10-CM | POA: Insufficient documentation

## 2016-03-08 DIAGNOSIS — M79662 Pain in left lower leg: Secondary | ICD-10-CM

## 2016-03-08 DIAGNOSIS — M79604 Pain in right leg: Secondary | ICD-10-CM | POA: Insufficient documentation

## 2016-03-08 DIAGNOSIS — F17210 Nicotine dependence, cigarettes, uncomplicated: Secondary | ICD-10-CM | POA: Insufficient documentation

## 2016-03-08 DIAGNOSIS — M79661 Pain in right lower leg: Secondary | ICD-10-CM

## 2016-03-08 DIAGNOSIS — R110 Nausea: Secondary | ICD-10-CM | POA: Insufficient documentation

## 2016-03-08 DIAGNOSIS — M79605 Pain in left leg: Secondary | ICD-10-CM | POA: Insufficient documentation

## 2016-03-08 DIAGNOSIS — R1084 Generalized abdominal pain: Secondary | ICD-10-CM

## 2016-03-08 DIAGNOSIS — Z859 Personal history of malignant neoplasm, unspecified: Secondary | ICD-10-CM | POA: Insufficient documentation

## 2016-03-08 DIAGNOSIS — R918 Other nonspecific abnormal finding of lung field: Secondary | ICD-10-CM | POA: Insufficient documentation

## 2016-03-08 LAB — URINALYSIS COMPLETE WITH MICROSCOPIC (ARMC ONLY)
BILIRUBIN URINE: NEGATIVE
GLUCOSE, UA: NEGATIVE mg/dL
Ketones, ur: NEGATIVE mg/dL
Leukocytes, UA: NEGATIVE
NITRITE: NEGATIVE
Protein, ur: NEGATIVE mg/dL
Specific Gravity, Urine: 1.004 — ABNORMAL LOW (ref 1.005–1.030)
pH: 7 (ref 5.0–8.0)

## 2016-03-08 LAB — COMPREHENSIVE METABOLIC PANEL
ALBUMIN: 4.2 g/dL (ref 3.5–5.0)
ALK PHOS: 76 U/L (ref 38–126)
ALT: 8 U/L — ABNORMAL LOW (ref 14–54)
AST: 18 U/L (ref 15–41)
Anion gap: 6 (ref 5–15)
BILIRUBIN TOTAL: 0.6 mg/dL (ref 0.3–1.2)
BUN: 8 mg/dL (ref 6–20)
CALCIUM: 9.1 mg/dL (ref 8.9–10.3)
CO2: 27 mmol/L (ref 22–32)
Chloride: 106 mmol/L (ref 101–111)
Creatinine, Ser: 0.8 mg/dL (ref 0.44–1.00)
GFR calc Af Amer: >60 mL/min (ref >60)
GFR calc non Af Amer: >60 mL/min (ref >60)
GLUCOSE: 93 mg/dL (ref 65–99)
Potassium: 3.4 mmol/L — ABNORMAL LOW (ref 3.5–5.1)
SODIUM: 139 mmol/L (ref 135–145)
TOTAL PROTEIN: 7.1 g/dL (ref 6.5–8.1)

## 2016-03-08 LAB — CBC
HEMATOCRIT: 40.5 % (ref 35.0–47.0)
Hemoglobin: 13.6 g/dL (ref 12.0–16.0)
MCH: 30.6 pg (ref 26.0–34.0)
MCHC: 33.6 g/dL (ref 32.0–36.0)
MCV: 91.2 fL (ref 80.0–100.0)
Platelets: 364 10*3/uL (ref 150–440)
RBC: 4.44 MIL/uL (ref 3.80–5.20)
RDW: 13.9 % (ref 11.5–14.5)
WBC: 8.4 10*3/uL (ref 3.6–11.0)

## 2016-03-08 LAB — LIPASE, BLOOD: Lipase: 66 U/L — ABNORMAL HIGH (ref 11–51)

## 2016-03-08 MED ORDER — DIATRIZOATE MEGLUMINE & SODIUM 66-10 % PO SOLN
15.0000 mL | Freq: Once | ORAL | Status: AC
  Administered 2016-03-08: 15 mL via ORAL

## 2016-03-08 MED ORDER — IOPAMIDOL (ISOVUE-300) INJECTION 61%
100.0000 mL | Freq: Once | INTRAVENOUS | Status: AC | PRN
  Administered 2016-03-08: 100 mL via INTRAVENOUS

## 2016-03-08 MED ORDER — SODIUM CHLORIDE 0.9 % IV BOLUS (SEPSIS)
1000.0000 mL | Freq: Once | INTRAVENOUS | Status: AC
  Administered 2016-03-08: 1000 mL via INTRAVENOUS

## 2016-03-08 NOTE — ED Notes (Signed)
Patient updated on wait time. Understands.

## 2016-03-08 NOTE — ED Provider Notes (Signed)
[facility] Emergency Department Provider Note   ____________________________________________  Time seen: Approximately 10 PM  I have reviewed the triage vital signs and the nursing notes.   HISTORY  Chief Complaint Abdominal Pain   HPI Nicole Chandler is a 55 y.o. female with a history of bipolar disorder as well as panic attacks and cervical cancer who is presenting to the emergency department today with lower abdominal pain as well as calf pain. She says that the abdominal pain and calf pain started yesterday. She says that the pain is mostly to the lower abdomen across her lower abdomen. She denies any dysuria. Denies any vaginal discharge or bleeding. Says she is nauseous but has not vomited. Says the pain is a 10 out of 10 and cramping and nonradiating. She says that the pain in her legs is also cramping. Denies any history of DVT or PE. Was seen recently in the emergency department requesting opiate detox.Denies any diarrhea. Says took aspirin and a Percocet today without any relief.   Past Medical History  Diagnosis Date  . Chronic back pain unk  . Depression   . Bipolar 1 disorder (HCC)   . Panic attacks   . Cancer [facility](HCC)     Patient Active Problem List   Diagnosis Date Noted  . Substance induced mood disorder (HCC) 03/05/2016  . Opioid use disorder, severe, dependence (HCC) 02/25/2016  . Cocaine use disorder, moderate, dependence (HCC) 02/25/2016  . Tobacco use disorder 02/25/2016  . Opioid-induced mood disorder (HCC) 02/25/2016  . Cannabis use disorder, moderate, dependence (HCC) 02/25/2016    Past Surgical History  Procedure Laterality Date  . Eye surgery    . Abdominal surgery      Current Outpatient Rx  Name  Route  Sig  Dispense  Refill  . albuterol (PROVENTIL HFA;VENTOLIN HFA) 108 (90 Base) MCG/ACT inhaler   Inhalation   Inhale 1-2 puffs into the lungs every 6 (six) hours as needed for wheezing or shortness of breath.   1  Inhaler   0   . BIOTIN PO   Oral   Take 1 tablet by mouth daily.         . clonazePAM (KLONOPIN) 0.5 MG tablet   Oral   Take 0.5 mg by mouth 3 (three) times daily.         . cyclobenzaprine (FLEXERIL) 5 MG tablet      1 tablet every 8 hours as needed for muscle spasms   15 tablet   0   . ibuprofen (ADVIL,MOTRIN) 600 MG tablet   Oral   Take 1 tablet (600 mg total) by mouth every 8 (eight) hours as needed.   15 tablet   0   . Multiple Vitamins-Minerals (MULTIVITAMIN WITH MINERALS) tablet   Oral   Take 1 tablet by mouth daily.         . naproxen (NAPROSYN) 500 MG tablet   Oral   Take 1 tablet (500 mg total) by mouth 2 (two) times daily with a meal.   28 tablet   0     Allergies Review of patient's allergies indicates no known allergies.  Family History  Problem Relation Age of Onset  . Alzheimer's disease Mother   . AAA (abdominal aortic aneurysm) Father   . Breast cancer Sister [phone]    Social History Social History  Substance Use Topics  . Smoking status: Current Some Day Smoker -- 0.50 packs/day    Types: Cigarettes    Last  Attempt to Quit: 08/27/2014  . Smokeless tobacco: None  . Alcohol Use: No    Review of Systems Constitutional: No fever/chills Eyes: No visual changes. ENT: No sore throat. Cardiovascular: Denies chest pain. Respiratory: Denies shortness of breath. Gastrointestinal:  no vomiting.  No diarrhea.  No constipation. Genitourinary: Negative for dysuria. Musculoskeletal: Also with right lower back pain. Skin: Negative for rash. Neurological: Negative for headaches, focal weakness or numbness.  10-point ROS otherwise negative.  ____________________________________________   PHYSICAL EXAM:  VITAL SIGNS: ED Triage Vitals  Enc Vitals Group     BP 03/08/16 1533 112/73 mmHg     Pulse Rate 03/08/16 1533 84     Resp 03/08/16 1533 20     Temp 03/08/16 1533 98.3 F (36.8 C)     Temp Source 03/08/16 1533 Oral     SpO2 03/08/16  1533 98 %     Weight 03/08/16 1533 127 lb (57.607 kg)     Height 03/08/16 1533 5\' 2"  (1.575 m)     Head Cir --      Peak Flow --      Pain Score 03/08/16 1532 10     Pain Loc --      Pain Edu? --      Excl. in GC? --     Constitutional: Alert and oriented.  in no acute distress. Eyes: Conjunctivae are normal. PERRL. EOMI. Head: Atraumatic. Nose: No congestion/rhinnorhea. Mouth/Throat: Mucous membranes are moist.   Neck: No stridor.   Cardiovascular: Normal rate, regular rhythm. Grossly normal heart sounds.  Respiratory: Normal respiratory effort.  No retractions. Lungs CTAB. Gastrointestinal: Soft With diffuse tenderness which is worse to the lower abdomen. No rebound or guarding. No distention.  No CVA tenderness. Musculoskeletal: No lower extremity edema.  No erythema, induration or pus. Mild tenderness to the bilateral calves. No joint effusions. Neurologic:  Normal speech and language. No gross focal neurologic deficits are appreciated.  Skin:  Skin is warm, dry and intact. No rash noted. Psychiatric: Mood and affect are normal. Speech and behavior are normal.  ____________________________________________   LABS (all labs ordered are listed, but only abnormal results are displayed)  Labs Reviewed  LIPASE, BLOOD - Abnormal; Notable for the following:    Lipase 66 (*)    All other components within normal limits  COMPREHENSIVE METABOLIC PANEL - Abnormal; Notable for the following:    Potassium 3.4 (*)    ALT 8 (*)    All other components within normal limits  URINALYSIS COMPLETEWITH MICROSCOPIC ([facility] ONLY) - Abnormal; Notable for the following:    Color, Urine STRAW (*)    APPearance CLEAR (*)    Specific Gravity, Urine 1.004 (*)    Hgb urine dipstick 1+ (*)    Bacteria, UA RARE (*)    Squamous Epithelial / LPF 0-5 (*)    All other components within normal limits  CBC    ____________________________________________  EKG   ____________________________________________  RADIOLOGY   ____________________________________________   PROCEDURES   Procedures   ____________________________________________   INITIAL IMPRESSION / ASSESSMENT AND PLAN / ED COURSE  Pertinent labs & imaging results that were available during my care of the patient were reviewed by me and considered in my medical decision making (see chart for details).  ----------------------------------------- 11:03 PM on 03/08/2016 -----------------------------------------  Patient with very reassuring labs. Also with history of opiate dependence. I discussed with her that we will be holding on any opiate narcotics until the imaging returned because of her past  history. Due to her history of cervical cancer we will also be pursuing ultrasound of her lower extremities make sure there is no DVT. Signed out to Dr. Dolores Frame. ____________________________________________   FINAL CLINICAL IMPRESSION(S) / ED DIAGNOSES  Generalized abdominal pain with nausea. Bilateral calf pain.    NEW MEDICATIONS STARTED DURING THIS VISIT:  New Prescriptions   No medications on file     Note:  This document was prepared using Dragon voice recognition software and may include unintentional dictation errors.    Myrna Blazer, MD 03/08/16 [phone]

## 2016-03-08 NOTE — ED Notes (Signed)
Presents with abd pain and lower back pain  Pain radiates in groin on right side

## 2016-03-08 NOTE — ED Notes (Signed)
Pt. States RLQ pain that started last night.  Pt. Denies vomiting and diarrhea.  Pt. States taking aspirin today and half of a percocet with no relief.  Pt. Also States right lower back pain and some leg stiffness.

## 2016-03-09 ENCOUNTER — Emergency Department: Payer: Self-pay

## 2016-03-09 MED ORDER — DICYCLOMINE HCL 20 MG PO TABS
20.0000 mg | ORAL_TABLET | Freq: Once | ORAL | Status: AC
  Administered 2016-03-09: 20 mg via ORAL
  Filled 2016-03-09: qty 1

## 2016-03-09 MED ORDER — DIAZEPAM 5 MG/ML IJ SOLN
2.0000 mg | Freq: Once | INTRAMUSCULAR | Status: AC
  Administered 2016-03-09: 2 mg via INTRAVENOUS
  Filled 2016-03-09: qty 2

## 2016-03-09 MED ORDER — DICYCLOMINE HCL 20 MG PO TABS: 20.0000 mg | ORAL_TABLET | Freq: Four times a day (QID) | ORAL | Status: DC | PRN

## 2016-03-09 NOTE — ED Provider Notes (Signed)
-----------------------------------------   12:32 AM on 03/09/2016 -----------------------------------------  CT abdomen and pelvis with contrast interpreted per Dr. Andria MeuseStevens: Possible 9 mm lung nodule in the right base. CT chest suggested for further evaluation as discussed above. 18 mm circumscribed enhancing lesion in the uterus. Ultrasound suggested for further characterization. No acute process demonstrated in the abdomen or pelvis. No evidence of bowel obstruction or inflammation.  Patient currently in ultrasound. Will go for CT chest after she returns to the treatment room.  ----------------------------------------- 1:58 AM on 03/09/2016 -----------------------------------------  CT chest without contrast interpreted per Dr. Gwenyth Benderadparvar: A 4 mm right lower lobe subpleural nodule otherwise unremarkable noncontrast CT of the chest.  Bilateral doppler ultrasound interpreted per Dr. Gwenyth Benderadparvar: No evidence of deep venous thrombosis.  Updated patient on imaging results. She ate a [place] sandwich tray and is now eager for discharge. Return precautions given. Patient verbalizes understanding and agrees with plan of care.  Irean HongJade J Sung, MD 03/09/16 [phone]

## 2016-03-09 NOTE — Discharge Instructions (Signed)
1. Your chest CT demonstrates a small 4 mm lung nodule. Your doctor will be able to keep check on this every 6 months. 2. Return to the ER for worsening symptoms, persistent vomiting, difficult breathing or other concerns.  Abdominal Pain, Adult Many things can cause abdominal pain. Usually, abdominal pain is not caused by a disease and will improve without treatment. It can often be observed and treated at home. Your health care provider will do a physical exam and possibly order blood tests and X-rays to help determine the seriousness of your pain. However, in many cases, more time must pass before a clear cause of the pain can be found. Before that point, your health care provider may not know if you need more testing or further treatment. HOME CARE INSTRUCTIONS Monitor your abdominal pain for any changes. The following actions may help to alleviate any discomfort you are experiencing:  Only take over-the-counter or prescription medicines as directed by your health care provider.  Do not take laxatives unless directed to do so by your health care provider.  Try a clear liquid diet (broth, tea, or water) as directed by your health care provider. Slowly move to a bland diet as tolerated. SEEK MEDICAL CARE IF:  You have unexplained abdominal pain.  You have abdominal pain associated with nausea or diarrhea.  You have pain when you urinate or have a bowel movement.  You experience abdominal pain that wakes you in the night.  You have abdominal pain that is worsened or improved by eating food.  You have abdominal pain that is worsened with eating fatty foods.  You have a fever. SEEK IMMEDIATE MEDICAL CARE IF:  Your pain does not go away within 2 hours.  You keep throwing up (vomiting).  Your pain is felt only in portions of the abdomen, such as the right side or the left lower portion of the abdomen.  You pass bloody or black tarry stools. MAKE SURE YOU:  Understand these  instructions.  Will watch your condition.  Will get help right away if you are not doing well or get worse.   This information is not intended to replace advice given to you by your health care provider. Make sure you discuss any questions you have with your health care provider.   Document Released: 05/21/2005 Document Revised: 05/02/2015 Document Reviewed: 04/20/2013 Elsevier Interactive Patient Education 2016 [employer]  Nausea, Adult Nausea is the feeling that you have an upset stomach or have to vomit. Nausea by itself is not likely a serious concern, but it may be an early sign of more serious medical problems. As nausea gets worse, it can lead to vomiting. If vomiting develops, there is the risk of dehydration.  CAUSES   Viral infections.  Food poisoning.  Medicines.  Pregnancy.  Motion sickness.  Migraine headaches.  Emotional distress.  Severe pain from any source.  Alcohol intoxication. HOME CARE INSTRUCTIONS  Get plenty of rest.  Ask your caregiver about specific rehydration instructions.  Eat small amounts of food and sip liquids more often.  Take all medicines as told by your caregiver. SEEK MEDICAL CARE IF:  You have not improved after 2 days, or you get worse.  You have a headache. SEEK IMMEDIATE MEDICAL CARE IF:   You have a fever.  You faint.  You keep vomiting or have blood in your vomit.  You are extremely weak or dehydrated.  You have dark or bloody stools.  You have severe chest or abdominal  pain. MAKE SURE YOU:  Understand these instructions.  Will watch your condition.  Will get help right away if you are not doing well or get worse.   This information is not intended to replace advice given to you by your health care provider. Make sure you discuss any questions you have with your health care provider.   Document Released: 09/18/2004 Document Revised: 09/01/2014 Document Reviewed: 04/23/2011 Elsevier Interactive Patient  Education [employer]

## 2016-03-09 NOTE — ED Notes (Signed)
Pt. Called for ride, waiting in waiting room.

## 2016-03-23 DIAGNOSIS — F17210 Nicotine dependence, cigarettes, uncomplicated: Secondary | ICD-10-CM | POA: Insufficient documentation

## 2016-03-23 DIAGNOSIS — Z859 Personal history of malignant neoplasm, unspecified: Secondary | ICD-10-CM | POA: Insufficient documentation

## 2016-03-23 DIAGNOSIS — F1290 Cannabis use, unspecified, uncomplicated: Secondary | ICD-10-CM | POA: Insufficient documentation

## 2016-03-23 DIAGNOSIS — F1490 Cocaine use, unspecified, uncomplicated: Secondary | ICD-10-CM | POA: Insufficient documentation

## 2016-03-23 DIAGNOSIS — M5441 Lumbago with sciatica, right side: Secondary | ICD-10-CM | POA: Insufficient documentation
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
## 2016-03-24 ENCOUNTER — Emergency Department
Admission: EM | Admit: 2016-03-24 | Discharge: 2016-03-24 | Disposition: A | Discharge status: Home / Self Care | Payer: Self-pay | Attending: Emergency Medicine | Admitting: Emergency Medicine

## 2016-03-24 ENCOUNTER — Emergency Department: Payer: Self-pay

## 2016-03-24 ENCOUNTER — Encounter: Payer: Self-pay | Admitting: Emergency Medicine

## 2016-03-24 ENCOUNTER — Observation Stay
Admission: EM | Admit: 2016-03-24 | Discharge: 2016-03-25 | Disposition: A | Discharge status: Home / Self Care | Payer: Self-pay | Attending: Internal Medicine | Admitting: Internal Medicine

## 2016-03-24 DIAGNOSIS — M549 Dorsalgia, unspecified: Secondary | ICD-10-CM | POA: Insufficient documentation

## 2016-03-24 DIAGNOSIS — F4310 Post-traumatic stress disorder, unspecified: Secondary | ICD-10-CM | POA: Insufficient documentation

## 2016-03-24 DIAGNOSIS — E876 Hypokalemia: Secondary | ICD-10-CM | POA: Insufficient documentation

## 2016-03-24 DIAGNOSIS — M5442 Lumbago with sciatica, left side: Secondary | ICD-10-CM

## 2016-03-24 DIAGNOSIS — F1220 Cannabis dependence, uncomplicated: Secondary | ICD-10-CM | POA: Insufficient documentation

## 2016-03-24 DIAGNOSIS — Z853 Personal history of malignant neoplasm of breast: Secondary | ICD-10-CM | POA: Insufficient documentation

## 2016-03-24 DIAGNOSIS — F419 Anxiety disorder, unspecified: Secondary | ICD-10-CM | POA: Insufficient documentation

## 2016-03-24 DIAGNOSIS — Z79899 Other long term (current) drug therapy: Secondary | ICD-10-CM | POA: Insufficient documentation

## 2016-03-24 DIAGNOSIS — F17210 Nicotine dependence, cigarettes, uncomplicated: Secondary | ICD-10-CM | POA: Insufficient documentation

## 2016-03-24 DIAGNOSIS — F1120 Opioid dependence, uncomplicated: Secondary | ICD-10-CM | POA: Insufficient documentation

## 2016-03-24 DIAGNOSIS — R569 Unspecified convulsions: Principal | ICD-10-CM

## 2016-03-24 DIAGNOSIS — Z8541 Personal history of malignant neoplasm of cervix uteri: Secondary | ICD-10-CM | POA: Insufficient documentation

## 2016-03-24 DIAGNOSIS — F319 Bipolar disorder, unspecified: Secondary | ICD-10-CM | POA: Insufficient documentation

## 2016-03-24 DIAGNOSIS — Z820 Family history of epilepsy and other diseases of the nervous system: Secondary | ICD-10-CM | POA: Insufficient documentation

## 2016-03-24 DIAGNOSIS — F410 Panic disorder [episodic paroxysmal anxiety] without agoraphobia: Secondary | ICD-10-CM | POA: Insufficient documentation

## 2016-03-24 DIAGNOSIS — M5441 Lumbago with sciatica, right side: Secondary | ICD-10-CM

## 2016-03-24 DIAGNOSIS — F1420 Cocaine dependence, uncomplicated: Secondary | ICD-10-CM | POA: Insufficient documentation

## 2016-03-24 DIAGNOSIS — Z803 Family history of malignant neoplasm of breast: Secondary | ICD-10-CM | POA: Insufficient documentation

## 2016-03-24 DIAGNOSIS — G8929 Other chronic pain: Secondary | ICD-10-CM | POA: Insufficient documentation

## 2016-03-24 DIAGNOSIS — Z8249 Family history of ischemic heart disease and other diseases of the circulatory system: Secondary | ICD-10-CM | POA: Insufficient documentation

## 2016-03-24 LAB — URINE DRUG SCREEN, QUALITATIVE (ARMC ONLY)
AMPHETAMINES, UR SCREEN: NOT DETECTED
Barbiturates, Ur Screen: NOT DETECTED
Benzodiazepine, Ur Scrn: POSITIVE — AB
COCAINE METABOLITE, UR ~~LOC~~: NOT DETECTED
Cannabinoid 50 Ng, Ur ~~LOC~~: POSITIVE — AB
MDMA (ECSTASY) UR SCREEN: NOT DETECTED
METHADONE SCREEN, URINE: POSITIVE — AB
OPIATE, UR SCREEN: NOT DETECTED
Phencyclidine (PCP) Ur S: NOT DETECTED
TRICYCLIC, UR SCREEN: POSITIVE — AB

## 2016-03-24 LAB — COMPREHENSIVE METABOLIC PANEL
ALBUMIN: 4.4 g/dL (ref 3.5–5.0)
ALK PHOS: 62 U/L (ref 38–126)
ALT: 8 U/L — AB (ref 14–54)
ANION GAP: 8 (ref 5–15)
AST: 22 U/L (ref 15–41)
BILIRUBIN TOTAL: 0.5 mg/dL (ref 0.3–1.2)
BUN: 14 mg/dL (ref 6–20)
CALCIUM: 9.3 mg/dL (ref 8.9–10.3)
CO2: 30 mmol/L (ref 22–32)
CREATININE: 1.18 mg/dL — AB (ref 0.44–1.00)
Chloride: 98 mmol/L — ABNORMAL LOW (ref 101–111)
GFR calc non Af Amer: 51 mL/min — ABNORMAL LOW (ref >60)
GFR, EST AFRICAN AMERICAN: 59 mL/min — AB (ref >60)
GLUCOSE: 112 mg/dL — AB (ref 65–99)
Potassium: 3.2 mmol/L — ABNORMAL LOW (ref 3.5–5.1)
SODIUM: 136 mmol/L (ref 135–145)
TOTAL PROTEIN: 7.5 g/dL (ref 6.5–8.1)

## 2016-03-24 LAB — URINALYSIS COMPLETE WITH MICROSCOPIC (ARMC ONLY)
BACTERIA UA: NONE SEEN
BILIRUBIN URINE: NEGATIVE
GLUCOSE, UA: NEGATIVE mg/dL
Ketones, ur: NEGATIVE mg/dL
Nitrite: NEGATIVE
Protein, ur: 100 mg/dL — AB
Specific Gravity, Urine: 1.021 (ref 1.005–1.030)
pH: 5 (ref 5.0–8.0)

## 2016-03-24 LAB — CBC WITH DIFFERENTIAL/PLATELET
Basophils Absolute: 0 10*3/uL (ref 0–0.1)
Basophils Relative: 0 %
EOS ABS: 0.2 10*3/uL (ref 0–0.7)
Eosinophils Relative: 1 %
HEMATOCRIT: 39.7 % (ref 35.0–47.0)
HEMOGLOBIN: 13.8 g/dL (ref 12.0–16.0)
LYMPHS ABS: 1.8 10*3/uL (ref 1.0–3.6)
Lymphocytes Relative: 16 %
MCH: 31.1 pg (ref 26.0–34.0)
MCHC: 34.8 g/dL (ref 32.0–36.0)
MCV: 89.4 fL (ref 80.0–100.0)
MONOS PCT: 6 %
Monocytes Absolute: 0.7 10*3/uL (ref 0.2–0.9)
NEUTROS ABS: 8.3 10*3/uL — AB (ref 1.4–6.5)
NEUTROS PCT: 77 %
Platelets: 276 10*3/uL (ref 150–440)
RBC: 4.44 MIL/uL (ref 3.80–5.20)
RDW: 14.1 % (ref 11.5–14.5)
WBC: 11 10*3/uL (ref 3.6–11.0)

## 2016-03-24 LAB — ACETAMINOPHEN LEVEL: Acetaminophen (Tylenol), Serum: <10 ug/mL — ABNORMAL LOW (ref 10–30)

## 2016-03-24 LAB — PREGNANCY, URINE: PREG TEST UR: NEGATIVE

## 2016-03-24 LAB — ETHANOL: Alcohol, Ethyl (B): <5 mg/dL (ref <5)

## 2016-03-24 LAB — SALICYLATE LEVEL

## 2016-03-24 MED ORDER — ONDANSETRON HCL 4 MG/2ML IJ SOLN: 4.0000 mg | Freq: Four times a day (QID) | INTRAMUSCULAR | Status: DC | PRN

## 2016-03-24 MED ORDER — ETODOLAC 200 MG PO CAPS: 200.0000 mg | ORAL_CAPSULE | Freq: Three times a day (TID) | ORAL | 0 refills | Status: DC

## 2016-03-24 MED ORDER — LIDOCAINE 5 % EX PTCH
1.0000 | MEDICATED_PATCH | Freq: Two times a day (BID) | CUTANEOUS | Status: DC
  Administered 2016-03-24 – 2016-03-25 (×2): 1 via TRANSDERMAL
  Filled 2016-03-24 (×3): qty 1

## 2016-03-24 MED ORDER — ONDANSETRON HCL 4 MG PO TABS: 4.0000 mg | ORAL_TABLET | Freq: Four times a day (QID) | ORAL | Status: DC | PRN

## 2016-03-24 MED ORDER — NICOTINE 14 MG/24HR TD PT24
14.0000 mg | MEDICATED_PATCH | Freq: Once | TRANSDERMAL | Status: DC
  Administered 2016-03-24: 23:00:00 14 mg via TRANSDERMAL
  Filled 2016-03-24: qty 1

## 2016-03-24 MED ORDER — SODIUM CHLORIDE 0.9 % IV BOLUS (SEPSIS)
1000.0000 mL | Freq: Once | INTRAVENOUS | Status: AC
  Administered 2016-03-24: 1000 mL via INTRAVENOUS

## 2016-03-24 MED ORDER — NICOTINE 14 MG/24HR TD PT24
14.0000 mg | MEDICATED_PATCH | Freq: Every day | TRANSDERMAL | Status: DC
  Administered 2016-03-25: 09:00:00 14 mg via TRANSDERMAL
  Filled 2016-03-24: qty 1

## 2016-03-24 MED ORDER — IBUPROFEN 600 MG PO TABS
600.0000 mg | ORAL_TABLET | Freq: Four times a day (QID) | ORAL | Status: DC | PRN
  Administered 2016-03-25: 06:00:00 600 mg via ORAL
  Filled 2016-03-24: qty 1

## 2016-03-24 MED ORDER — LORAZEPAM 2 MG/ML IJ SOLN
2.0000 mg | Freq: Once | INTRAMUSCULAR | Status: AC
  Administered 2016-03-24: 2 mg via INTRAVENOUS

## 2016-03-24 MED ORDER — LIDOCAINE 5 % EX PTCH: 1.0000 | MEDICATED_PATCH | Freq: Two times a day (BID) | CUTANEOUS | 0 refills | Status: AC

## 2016-03-24 MED ORDER — ENOXAPARIN SODIUM 40 MG/0.4ML ~~LOC~~ SOLN: 40.0000 mg | SUBCUTANEOUS | Status: DC

## 2016-03-24 MED ORDER — SODIUM CHLORIDE 0.9% FLUSH
3.0000 mL | Freq: Two times a day (BID) | INTRAVENOUS | Status: DC
  Administered 2016-03-24 – 2016-03-25 (×2): 3 mL via INTRAVENOUS

## 2016-03-24 MED ORDER — SODIUM CHLORIDE 0.9 % IV SOLN
1500.0000 mg | Freq: Once | INTRAVENOUS | Status: AC
  Administered 2016-03-24: 1500 mg via INTRAVENOUS
  Filled 2016-03-24: qty 15

## 2016-03-24 MED ORDER — KETOROLAC TROMETHAMINE 60 MG/2ML IM SOLN
60.0000 mg | Freq: Once | INTRAMUSCULAR | Status: AC
  Administered 2016-03-24: 60 mg via INTRAMUSCULAR
  Filled 2016-03-24: qty 2

## 2016-03-24 MED ORDER — SODIUM CHLORIDE 0.9% FLUSH: 3.0000 mL | INTRAVENOUS | Status: DC | PRN

## 2016-03-24 MED ORDER — TRAMADOL HCL 50 MG PO TABS: 50.0000 mg | ORAL_TABLET | Freq: Four times a day (QID) | ORAL | 0 refills | Status: DC | PRN

## 2016-03-24 MED ORDER — SODIUM CHLORIDE 0.9 % IV SOLN: 250.0000 mL | INTRAVENOUS | Status: DC | PRN

## 2016-03-24 MED ORDER — SODIUM CHLORIDE 0.9 % IV SOLN
1000.0000 mg | Freq: Two times a day (BID) | INTRAVENOUS | Status: DC
  Administered 2016-03-25: 1000 mg via INTRAVENOUS
  Filled 2016-03-24 (×2): qty 10

## 2016-03-24 MED ORDER — ACETAMINOPHEN 650 MG RE SUPP
650.0000 mg | Freq: Four times a day (QID) | RECTAL | Status: DC | PRN
Start: 2016-03-24 — End: 2016-03-25

## 2016-03-24 MED ORDER — ACETAMINOPHEN 325 MG PO TABS: 650.0000 mg | ORAL_TABLET | Freq: Four times a day (QID) | ORAL | Status: DC | PRN

## 2016-03-24 MED ORDER — LIDOCAINE 5 % EX PTCH
1.0000 | MEDICATED_PATCH | CUTANEOUS | Status: DC
  Administered 2016-03-24: 1 via TRANSDERMAL
  Filled 2016-03-24: qty 1

## 2016-03-24 MED ORDER — HYDROCODONE-ACETAMINOPHEN 5-325 MG PO TABS
1.0000 | ORAL_TABLET | ORAL | Status: DC | PRN
  Administered 2016-03-24: 23:00:00 1 via ORAL
  Administered 2016-03-25 (×2): 2 via ORAL
  Filled 2016-03-24 (×2): qty 2
  Filled 2016-03-24: qty 1

## 2016-03-24 MED ORDER — POTASSIUM CHLORIDE CRYS ER 20 MEQ PO TBCR
40.0000 meq | EXTENDED_RELEASE_TABLET | Freq: Once | ORAL | Status: AC
  Administered 2016-03-24: 23:00:00 40 meq via ORAL
  Filled 2016-03-24: qty 2
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
## 2016-03-24 NOTE — ED Notes (Signed)
Pt transported to Room 123

## 2016-03-24 NOTE — ED Provider Notes (Signed)
[facility] Emergency Department Provider Note  ____________________________________________  Time seen: Approximately 5:54 PM  I have reviewed the triage vital signs and the nursing notes.   HISTORY  Chief Complaint Seizures   HPI Nicole Chandler is a 55 y.o. female history of bipolar disorder, polysubstance abuse who presents for evaluation of seizure. Patient had a witnessed seizure at home. She sustained trauma to her tongue but no loss of urine or stool. Patient reports that she has had 5 seizures in her life with the last one about a year ago. She reports she has never been seen for these episodes and does not take any medications. She reports that she felt funny today the whole day. Patient is very jumpy during my exam and unable to answer some questions requiring details about her PMH and current events. She is complaining of low back pain which is chronic for her and she takes tramadol for it. Per EMS there was some concern that patient took extra tramadol at home but denies it. She endorses daily MJ use but denies any other drugs. She denies history of drinking. She denies headache, changes in vision, chest pain, shortness of breath, abdominal pain, nausea, vomiting, dysuria, diarrhea.  Past Medical History:  Diagnosis Date  . Bipolar 1 disorder (HCC)   . Cancer (HCC)   . Chronic back pain unk  . Depression   . Panic attacks     Patient Active Problem List   Diagnosis Date Noted  . Substance induced mood disorder (HCC) 03/05/2016  . Opioid use disorder, severe, dependence (HCC) 02/25/2016  . Cocaine use disorder, moderate, dependence (HCC) 02/25/2016  . Tobacco use disorder 02/25/2016  . Opioid-induced mood disorder (HCC) 02/25/2016  . Cannabis use disorder, moderate, dependence (HCC) 02/25/2016    Past Surgical History:  Procedure Laterality Date  . ABDOMINAL SURGERY    . EYE SURGERY      Prior to Admission medications   Medication Sig  Start Date End Date Taking? Authorizing Provider  albuterol (PROVENTIL HFA;VENTOLIN HFA) 108 (90 Base) MCG/ACT inhaler Inhale 1-2 puffs into the lungs every 6 (six) hours as needed for wheezing or shortness of breath. 01/16/16   Jami L Hagler, PA-C  BIOTIN PO Take 1 tablet by mouth daily.    Historical Provider, MD  clonazePAM (KLONOPIN) 0.5 MG tablet Take 0.5 mg by mouth 3 (three) times daily.    Historical Provider, MD  cyclobenzaprine (FLEXERIL) 5 MG tablet 1 tablet every 8 hours as needed for muscle spasms 02/18/16   Irean Hong, MD  dicyclomine (BENTYL) 20 MG tablet Take 1 tablet (20 mg total) by mouth every 6 (six) hours as needed. 03/09/16   Irean Hong, MD  etodolac (LODINE) 200 MG capsule Take 1 capsule (200 mg total) by mouth every 8 (eight) hours. 03/24/16   Rebecka Apley, MD  ibuprofen (ADVIL,MOTRIN) 600 MG tablet Take 1 tablet (600 mg total) by mouth every 8 (eight) hours as needed. 02/18/16   Irean Hong, MD  lidocaine (LIDODERM) 5 % Place 1 patch onto the skin every 12 (twelve) hours. Remove & Discard patch within 12 hours or as directed by MD 03/24/16 03/24/17  Rebecka Apley, MD  Multiple Vitamins-Minerals (MULTIVITAMIN WITH MINERALS) tablet Take 1 tablet by mouth daily.    Historical Provider, MD  naproxen (NAPROSYN) 500 MG tablet Take 1 tablet (500 mg total) by mouth 2 (two) times daily with a meal. 02/27/16   Delorise Royals Cuthriell, PA-C  traMADol (ULTRAM) 50 MG tablet Take 1 tablet (50 mg total) by mouth every 6 (six) hours as needed. 03/24/16   Rebecka Apley, MD    Allergies Review of patient's allergies indicates no known allergies.  Family History  Problem Relation Age of Onset  . Alzheimer's disease Mother   . AAA (abdominal aortic aneurysm) Father   . Breast cancer Sister 61    Social History Social History  Substance Use Topics  . Smoking status: Current Some Day Smoker    Packs/day: 0.50    Types: Cigarettes    Last attempt to quit: 08/27/2014  . Smokeless  tobacco: Never Used  . Alcohol use No    Review of Systems  Constitutional: Negative for fever. Eyes: Negative for visual changes. ENT: Negative for sore throat. Cardiovascular: Negative for chest pain. Respiratory: Negative for shortness of breath. Gastrointestinal: Negative for abdominal pain, vomiting or diarrhea. Genitourinary: Negative for dysuria. Musculoskeletal: + back pain. Skin: Negative for rash. Neurological: Negative for headaches, weakness or numbness. + seizure  ____________________________________________   PHYSICAL EXAM:  VITAL SIGNS: ED Triage Vitals  Enc Vitals Group     BP 03/24/16 1744 114/83     Pulse Rate 03/24/16 1744 85     Resp 03/24/16 1744 18     Temp 03/24/16 1744 98 F (36.7 C)     Temp Source 03/24/16 1744 Oral     SpO2 03/24/16 1744 96 %     Weight 03/24/16 1744 135 lb (61.2 kg)     Height 03/24/16 1744 5\' 2"  (1.575 m)     Head Circumference --      Peak Flow --      Pain Score 03/24/16 1746 8     Pain Loc --      Pain Edu? --      Excl. in GC? --     Constitutional: Alert and oriented. Well appearing and in no apparent distress. HEENT:      Head: Normocephalic and atraumatic.         Eyes: Conjunctivae are normal. Sclera is non-icteric. EOMI. PERRL      Mouth/Throat: Mucous membranes are moist.       Neck: Supple with no signs of meningismus. Cardiovascular: Regular rate and rhythm. No murmurs, gallops, or rubs. 2+ symmetrical distal pulses are present in all extremities. No JVD. Respiratory: Normal respiratory effort. Lungs are clear to auscultation bilaterally. No wheezes, crackles, or rhonchi.  Gastrointestinal: Soft, non tender, and non distended with positive bowel sounds. No rebound or guarding. Genitourinary: No CVA tenderness. Musculoskeletal: Nontender with normal range of motion in all extremities. No edema, cyanosis, or erythema of extremities. Neurologic: Patient has a tremor and has intermittent jerking movements of  her whole body. Normal speech and language. Face is symmetric. Moving all extremities. No pronator drift.  No gross focal neurologic deficits are appreciated. Skin: Skin is warm, dry and intact. No rash noted. Psychiatric: Mood and affect are normal. Speech and behavior are normal.  ____________________________________________   LABS (all labs ordered are listed, but only abnormal results are displayed)  Labs Reviewed  CBC WITH DIFFERENTIAL/PLATELET - Abnormal; Notable for the following:       Result Value   Neutro Abs 8.3 (*)    All other components within normal limits  COMPREHENSIVE METABOLIC PANEL - Abnormal; Notable for the following:    Potassium 3.2 (*)    Chloride 98 (*)    Glucose, Bld 112 (*)    Creatinine, Ser 1.18 (*)  ALT 8 (*)    GFR calc non Af Amer 51 (*)    GFR calc Af Amer 59 (*)    All other components within normal limits  ACETAMINOPHEN LEVEL - Abnormal; Notable for the following:    Acetaminophen (Tylenol), Serum <10 (*)    All other components within normal limits  URINE CULTURE  ETHANOL  SALICYLATE LEVEL  URINALYSIS COMPLETEWITH MICROSCOPIC ([facility] ONLY)  URINE DRUG SCREEN, QUALITATIVE ([facility] ONLY)  PREGNANCY, URINE   ____________________________________________  EKG  none ____________________________________________  RADIOLOGY  Head CT: negative ____________________________________________   PROCEDURES  Procedure(s) performed: None Procedures Critical Care performed: yes  CRITICAL CARE Performed by: Nita Sickle  ?  Total critical care time: 40 min  Critical care time was exclusive of separately billable procedures and treating other patients.  Critical care was necessary to treat or prevent imminent or life-threatening deterioration.  Critical care was time spent personally by me on the following activities: development of treatment plan with patient and/or surrogate as well as nursing, discussions with consultants,  evaluation of patient's response to treatment, examination of patient, obtaining history from patient or surrogate, ordering and performing treatments and interventions, ordering and review of laboratory studies, ordering and review of radiographic studies, pulse oximetry and re-evaluation of patient's condition.  ____________________________________________   INITIAL IMPRESSION / ASSESSMENT AND PLAN / ED COURSE   55 y.o. female history of bipolar disorder, polysubstance abuse who presents for evaluation of witnessed episode of seizure earlier today. Patient seems postictal at this time and is mildly confused. She is neurologically intact. She endorses prior history of seizures although hasn't had one a year. She reports she has never been seen for those and has never taken medication for it. Patient has tongue trauma from this episode but no other injuries. We'll get a head CT, basic labs including a urine tox screen, alcohol level, salicylate, and Tylenol levels. [facility] consult neurology.  Clinical Course  Comment By Time  Patient had witnessed seizure in the ED. Was given 2mg  IV ativan. Will load with Keppra 1500mg . Labs and head CT pending. Nita Sickle, MD 07/31 [phone]    Pertinent labs & imaging results that were available during my care of the patient were reviewed by me and considered in my medical decision making (see chart for details).    ____________________________________________   FINAL CLINICAL IMPRESSION(S) / ED DIAGNOSES  Final diagnoses:  Seizure (HCC)      NEW MEDICATIONS STARTED DURING THIS VISIT:  New Prescriptions   No medications on file     Note:  This document was prepared using Dragon voice recognition software and may include unintentional dictation errors.    Nita Sickle, MD 03/24/16 [phone]
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
## 2016-03-24 NOTE — ED Triage Notes (Signed)
Pt to ED via [facility] c/o seizure. Per EMS pt had a seizure w/ LOC, pt reports biting tongue. Family reports pt may have taken 7 of her missing tramadol pills that she was prescribed today, pt denies taking pills stating they fell on the floor. Pt alert and oriented, in no acute distress at this time.

## 2016-03-25 DIAGNOSIS — R569 Unspecified convulsions: Secondary | ICD-10-CM

## 2016-03-25 LAB — BASIC METABOLIC PANEL
ANION GAP: 5 (ref 5–15)
BUN: 12 mg/dL (ref 6–20)
CALCIUM: 8.4 mg/dL — AB (ref 8.9–10.3)
CO2: 29 mmol/L (ref 22–32)
CREATININE: 0.98 mg/dL (ref 0.44–1.00)
Chloride: 105 mmol/L (ref 101–111)
Glucose, Bld: 66 mg/dL (ref 65–99)
Potassium: 3.8 mmol/L (ref 3.5–5.1)
SODIUM: 139 mmol/L (ref 135–145)

## 2016-03-25 LAB — CBC
HEMATOCRIT: 35.6 % (ref 35.0–47.0)
Hemoglobin: 12.1 g/dL (ref 12.0–16.0)
MCH: 30.9 pg (ref 26.0–34.0)
MCHC: 34 g/dL (ref 32.0–36.0)
MCV: 90.8 fL (ref 80.0–100.0)
PLATELETS: 245 10*3/uL (ref 150–440)
RBC: 3.92 MIL/uL (ref 3.80–5.20)
RDW: 14.2 % (ref 11.5–14.5)
WBC: 7.3 10*3/uL (ref 3.6–11.0)

## 2016-03-25 LAB — MAGNESIUM: MAGNESIUM: 2.2 mg/dL (ref 1.7–2.4)

## 2016-03-25 LAB — TSH: TSH: 2.123 u[IU]/mL (ref 0.350–4.500)

## 2016-03-25 MED ORDER — OXYCODONE HCL 5 MG PO TABS
5.0000 mg | ORAL_TABLET | Freq: Once | ORAL | Status: AC
  Administered 2016-03-25: 13:00:00 5 mg via ORAL
  Filled 2016-03-25: qty 1

## 2016-03-25 MED ORDER — OXYCODONE HCL 5 MG PO TABS: 5.0000 mg | ORAL_TABLET | ORAL | 0 refills | Status: DC | PRN

## 2016-03-25 MED ORDER — LEVETIRACETAM 500 MG PO TABS: 500.0000 mg | ORAL_TABLET | Freq: Two times a day (BID) | ORAL | 0 refills | Status: DC

## 2016-03-25 NOTE — Progress Notes (Signed)
Patient discharged home per MD order. Prescriptions given to patient. All discharge instructions given and all questions answered. 
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
## 2016-03-26 LAB — URINE CULTURE

## 2016-05-02 ENCOUNTER — Emergency Department
Admission: EM | Admit: 2016-05-02 | Discharge: 2016-05-02 | Disposition: A | Discharge status: Home / Self Care | Payer: Self-pay | Attending: Emergency Medicine | Admitting: Emergency Medicine

## 2016-05-02 ENCOUNTER — Emergency Department: Payer: Self-pay

## 2016-05-02 DIAGNOSIS — M19071 Primary osteoarthritis, right ankle and foot: Secondary | ICD-10-CM

## 2016-05-02 DIAGNOSIS — F17210 Nicotine dependence, cigarettes, uncomplicated: Secondary | ICD-10-CM | POA: Insufficient documentation

## 2016-05-02 DIAGNOSIS — M19072 Primary osteoarthritis, left ankle and foot: Secondary | ICD-10-CM | POA: Insufficient documentation

## 2016-05-02 MED ORDER — NAPROXEN 500 MG PO TABS: 500.0000 mg | ORAL_TABLET | Freq: Two times a day (BID) | ORAL | 0 refills | Status: DC

## 2016-05-02 NOTE — ED Provider Notes (Signed)
[facility] Emergency Department Provider Note  ____________________________________________  Time seen: Approximately 8:54 PM  I have reviewed the triage vital signs and the nursing notes.   HISTORY  Chief Complaint Foot Pain    HPI Nicole Chandler is a 55 y.o. female , NAD, presents to the emergency department with over 2 year history of left foot pain. Patient states she has pain to the bottom of her foot on most days. Does note at times she sees a swelling in between her left great toe and second toe in which she pushes it and it shifts below her foot. Denies any injury, traumas, falls. Has not had any numbness, weakness, tingling. Has not noted any redness, swelling, bruising, skin sores, open wounds or lacerations. Has not been evaluated or treated for such in the past.   Past Medical History:  Diagnosis Date  . Bipolar 1 disorder (HCC)   . Cancer (HCC)   . Chronic back pain unk  . Depression   . Panic attacks     Patient Active Problem List   Diagnosis Date Noted  . Seizure (HCC) 03/24/2016  . Substance induced mood disorder (HCC) 03/05/2016  . Opioid use disorder, severe, dependence (HCC) 02/25/2016  . Cocaine use disorder, moderate, dependence (HCC) 02/25/2016  . Tobacco use disorder 02/25/2016  . Opioid-induced mood disorder (HCC) 02/25/2016  . Cannabis use disorder, moderate, dependence (HCC) 02/25/2016    Past Surgical History:  Procedure Laterality Date  . ABDOMINAL SURGERY    . EYE SURGERY      Prior to Admission medications   Medication Sig Start Date End Date Taking? Authorizing Provider  BIOTIN PO Take 1 tablet by mouth daily.    Historical Provider, MD  ibuprofen (ADVIL,MOTRIN) 600 MG tablet Take 1 tablet (600 mg total) by mouth every 8 (eight) hours as needed. 02/18/16   Irean Hong, MD  lidocaine (LIDODERM) 5 % Place 1 patch onto the skin every 12 (twelve) hours. Remove & Discard patch within 12 hours or as directed by MD  03/24/16 03/24/17  Rebecka Apley, MD  Multiple Vitamins-Minerals (MULTIVITAMIN WITH MINERALS) tablet Take 1 tablet by mouth daily.    Historical Provider, MD  naproxen (NAPROSYN) 500 MG tablet Take 1 tablet (500 mg total) by mouth 2 (two) times daily with a meal. 05/02/16   Khadejah Son L Aaima Gaddie, PA-C  oxyCODONE (OXY IR/ROXICODONE) 5 MG immediate release tablet Take 1 tablet (5 mg total) by mouth every 4 (four) hours as needed for severe pain. 03/25/16   Wyatt Haste, MD    Allergies Review of patient's allergies indicates no known allergies.  Family History  Problem Relation Age of Onset  . Alzheimer's disease Mother   . AAA (abdominal aortic aneurysm) Father   . Breast cancer Sister 74    Social History Social History  Substance Use Topics  . Smoking status: Current Some Day Smoker    Packs/day: 0.50    Types: Cigarettes    Last attempt to quit: 08/27/2014  . Smokeless tobacco: Never Used  . Alcohol use No     Review of Systems Constitutional: No fever/chills Musculoskeletal: Positive left foot pain.  Skin: Negative for rash, redness, swelling, bruising, lacerations, skin sores. Neurological: Negative for numbness, weakness, tingling 10-point ROS otherwise negative.  ____________________________________________   PHYSICAL EXAM:  VITAL SIGNS: ED Triage Vitals  Enc Vitals Group     BP 05/02/16 1947 124/85     Pulse Rate 05/02/16 1947 72  Resp 05/02/16 1947 20     Temp 05/02/16 1947 98 F (36.7 C)     Temp Source 05/02/16 1947 Oral     SpO2 05/02/16 1947 97 %     Weight 05/02/16 1948 131 lb (59.4 kg)     Height 05/02/16 1948 5\' 2"  (1.575 m)     Head Circumference --      Peak Flow --      Pain Score 05/02/16 1948 8     Pain Loc --      Pain Edu? --      Excl. in GC? --      Constitutional: Alert and oriented. Well appearing and in no acute distress. Eyes: Conjunctivae are normal.  Head: Atraumatic. Cardiovascular: Good peripheral circulation with 2+ pulses noted  in the left lower extremity. Capillary refill is brisk in all digits of left foot. Respiratory: Normal respiratory effort without tachypnea or retractions.  Musculoskeletal: Full range of motion of the left ankle, foot and toes without significant pain. No lower extremity tenderness nor edema.  No joint effusions. Neurologic:  Normal speech and language. No gross focal neurologic deficits are appreciated. Sensation to light touch grossly intact about the left lower extremity. Skin:  Skin is warm, dry and intact. No rash, redness, swelling, open wounds or lacerations, skin sores noted. Psychiatric: Mood and affect are normal. Speech and behavior are normal. Patient exhibits appropriate insight and judgement.   ____________________________________________   LABS  None ____________________________________________  EKG  None ____________________________________________  RADIOLOGY I have personally viewed and evaluated these images (plain radiographs) as part of my medical decision making, as well as reviewing the written report by the radiologist.  Dg Foot Complete Left  Result Date: 05/02/2016 CLINICAL DATA:  Left great toe pain for 1 year, worsening pain last 2 days EXAM: LEFT FOOT - COMPLETE 3+ VIEW COMPARISON:  None. FINDINGS: Three views of the left foot submitted. No acute fracture or subluxation. Post arthritic changes are noted first metatarsal phalangeal joint. IMPRESSION: Osteoarthritic changes first metatarsal phalangeal joint. No acute fracture or subluxation. Electronically Signed   By: Natasha MeadLiviu  Pop M.D.   On: 05/02/2016 20:37    ____________________________________________    PROCEDURES  Procedure(s) performed: None   Procedures   Medications - No data to display   ____________________________________________   INITIAL IMPRESSION / ASSESSMENT AND PLAN / ED COURSE  Pertinent labs & imaging results that were available during my care of the patient were reviewed by  me and considered in my medical decision making (see chart for details).  Clinical Course    Patient's diagnosis is consistent with Arthritis of right foot. Patient was given a set of crutches to assist with ambulation. Patient will be discharged home with prescriptions for naproxen to take as directed. Patient is to follow up with Dr. Alberteen Spindleline in podiatry if symptoms persist past this treatment course. Patient is given ED precautions to return to the ED for any worsening or new symptoms.      ____________________________________________  FINAL CLINICAL IMPRESSION(S) / ED DIAGNOSES  Final diagnoses:  Arthritis of right foot      NEW MEDICATIONS STARTED DURING THIS VISIT:  Discharge Medication List as of 05/02/2016  8:59 PM    START taking these medications   Details  naproxen (NAPROSYN) 500 MG tablet Take 1 tablet (500 mg total) by mouth 2 (two) times daily with a meal., Starting Fri 05/02/2016, Print             Arjan Strohm  Jon Gills, PA-C 05/02/16 2215    Governor Rooks, MD 05/03/16 [phone]

## 2016-05-02 NOTE — ED Triage Notes (Addendum)
Pt ambulatory to triage room w/ c/o L foot pain. Pt has limp but refuses wheelchair.  Pt sts "I think it might be arthritis".  Pt denies recent injury.  Pt sts pain has been ongoing for 2 + years.  Sts that "bone is sticking out and I have to push it back in". Resp even and unlabored, skin tone WNL, alert and oriented x 4.

## 2016-06-28 ENCOUNTER — Emergency Department
Admission: EM | Admit: 2016-06-28 | Discharge: 2016-06-28 | Disposition: A | Discharge status: Home / Self Care | Payer: Self-pay | Attending: Emergency Medicine | Admitting: Emergency Medicine

## 2016-06-28 DIAGNOSIS — Z859 Personal history of malignant neoplasm, unspecified: Secondary | ICD-10-CM | POA: Insufficient documentation

## 2016-06-28 DIAGNOSIS — Z79899 Other long term (current) drug therapy: Secondary | ICD-10-CM | POA: Insufficient documentation

## 2016-06-28 DIAGNOSIS — F1290 Cannabis use, unspecified, uncomplicated: Secondary | ICD-10-CM | POA: Insufficient documentation

## 2016-06-28 DIAGNOSIS — M19072 Primary osteoarthritis, left ankle and foot: Secondary | ICD-10-CM

## 2016-06-28 DIAGNOSIS — F17210 Nicotine dependence, cigarettes, uncomplicated: Secondary | ICD-10-CM | POA: Insufficient documentation

## 2016-06-28 DIAGNOSIS — M19079 Primary osteoarthritis, unspecified ankle and foot: Secondary | ICD-10-CM | POA: Insufficient documentation

## 2016-06-28 DIAGNOSIS — F1490 Cocaine use, unspecified, uncomplicated: Secondary | ICD-10-CM | POA: Insufficient documentation

## 2016-06-28 MED ORDER — DICLOFENAC SODIUM 75 MG PO TBEC: 75.0000 mg | DELAYED_RELEASE_TABLET | Freq: Two times a day (BID) | ORAL | 0 refills | Status: DC

## 2016-06-28 MED ORDER — KETOROLAC TROMETHAMINE 60 MG/2ML IM SOLN
60.0000 mg | Freq: Once | INTRAMUSCULAR | Status: AC
  Administered 2016-06-28: 60 mg via INTRAMUSCULAR
  Filled 2016-06-28: qty 2

## 2016-06-28 NOTE — ED Provider Notes (Signed)
[facility] Emergency Department Provider Note  ____________________________________________   None    (approximate)  I have reviewed the triage vital signs and the nursing notes.   HISTORY  Chief Complaint Foot Pain   HPI Nicole Chandler is a 55 y.o. female who presents to the emergency department for left great toe pain.The patient reports she chronically has pain in that toe and is scheduled for surgery in February, but the pain has gotten worse in the last two days. Patient reports she was put on gabapentin and flexeril a week ago, but it is not controlling her pain. The pain is worse with touch, movement, and she has difficulty placing weight on that foot due to it. She denies any recent trauma, redness, or swelling to the area. She denies fever, chills, nausea, numbness or tingling in the left foot, or other painful joints.     Past Medical History:  Diagnosis Date  . Bipolar 1 disorder (HCC)   . Cancer (HCC)   . Chronic back pain unk  . Depression   . Panic attacks     Patient Active Problem List   Diagnosis Date Noted  . Seizure (HCC) 03/24/2016  . Substance induced mood disorder (HCC) 03/05/2016  . Opioid use disorder, severe, dependence (HCC) 02/25/2016  . Cocaine use disorder, moderate, dependence (HCC) 02/25/2016  . Tobacco use disorder 02/25/2016  . Opioid-induced mood disorder (HCC) 02/25/2016  . Cannabis use disorder, moderate, dependence (HCC) 02/25/2016    Past Surgical History:  Procedure Laterality Date  . ABDOMINAL SURGERY    . EYE SURGERY      Prior to Admission medications   Medication Sig Start Date End Date Taking? Authorizing Provider  BIOTIN PO Take 1 tablet by mouth daily.    Historical Provider, MD  diclofenac (VOLTAREN) 75 MG EC tablet Take 1 tablet (75 mg total) by mouth 2 (two) times daily. 06/28/16   Charmayne Sheerharles M Mauro Arps, PA-C  lidocaine (LIDODERM) 5 % Place 1 patch onto the skin every 12 (twelve) hours. Remove &  Discard patch within 12 hours or as directed by MD 03/24/16 03/24/17  Rebecka ApleyAllison P Webster, MD  Multiple Vitamins-Minerals (MULTIVITAMIN WITH MINERALS) tablet Take 1 tablet by mouth daily.    Historical Provider, MD    Allergies Review of patient's allergies indicates no known allergies.  Family History  Problem Relation Age of Onset  . Alzheimer's disease Mother   . AAA (abdominal aortic aneurysm) Father   . Breast cancer Sister [id number]    Social History Social History  Substance Use Topics  . Smoking status: Current Some Day Smoker    Packs/day: 0.50    Types: Cigarettes    Last attempt to quit: 08/27/2014  . Smokeless tobacco: Never Used  . Alcohol use No    Review of Systems Constitutional: No fever/chills Gastrointestinal: No abdominal pain.  No nausea, no vomiting.  No diarrhea.   Musculoskeletal: Positive for pain in left great toe Skin: Negative for redness or swelling to the affected joint Neurological: Negative for headaches, focal weakness or numbness.  10-point ROS otherwise negative.  ____________________________________________   PHYSICAL EXAM:  VITAL SIGNS: ED Triage Vitals  Enc Vitals Group     BP 06/28/16 1335 (!) 140/108     Pulse Rate 06/28/16 1335 76     Resp 06/28/16 1335 18     Temp 06/28/16 1335 97.6 F (36.4 C)     Temp Source 06/28/16 1335 Oral     SpO2 06/28/16  1335 94 %     Weight 06/28/16 1337 136 lb (61.7 kg)     Height 06/28/16 1337 5\' 3"  (1.6 m)     Head Circumference --      Peak Flow --      Pain Score 06/28/16 1341 10     Pain Loc --      Pain Edu? --      Excl. in GC? --     Constitutional: Alert and oriented. Tearful in exam room. Cardiovascular: Normal rate, regular rhythm. Grossly normal heart sounds.  Good peripheral circulation. Peripheral pulses 2+ bilaterally  Respiratory: Normal respiratory effort.  No retractions. Lungs CTAB. Musculoskeletal: Tenderness to palpation of the left great toe. Non-tender to palpation of  metatarsals or other digits of left foot. No bony abnormality noted.  No joint effusions. Full passive range of motion of great toe. Neurologic:  Normal speech and language. No gross focal neurologic deficits are appreciated. No gait instability. Skin:  Skin is warm, dry and intact. No rash noted. No erythema or swelling at left great toe. Psychiatric: Mood and affect are normal. Speech and behavior are normal.  ____________________________________________   LABS (all labs ordered are listed, but only abnormal results are displayed)  Labs Reviewed - No data to display ____________________________________________  EKG  None ____________________________________________  RADIOLOGY  None ____________________________________________   PROCEDURES  Procedure(s) performed: Patient given IM injection of Toradol 60mg  in ED for pain  Procedures  Critical Care performed: No  ____________________________________________   INITIAL IMPRESSION / ASSESSMENT AND PLAN / ED COURSE  Pertinent labs & imaging results that were available during my care of the patient were reviewed by me and considered in my medical decision making (see chart for details).  Nicole Chandler is a 55 year old female presenting to the ED for 2 history of worsening left great toe pain. Patient reports pain is not controlled with gabapentin and flexeril. Patient was given Toradol in ED for pain control and sent home with a prescription of diclofenac to take as directed for pain. Patient is to follow up with PCP as needed for pain and return to the ED is symptoms persist or worsen.   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   Evangeline Dakinharles M Karver Fadden, PA-C 06/28/16 1750    Minna AntisKevin Paduchowski, MD 06/29/16 1452

## 2016-06-28 NOTE — ED Triage Notes (Signed)
Pt states that she is supposed to have surg on her rt great toe for a growth that is pressing in on the toe pushing her bone outward, pt is crying with pain, states that her medications are not helping it today

## 2016-06-30 ENCOUNTER — Emergency Department
Admission: EM | Admit: 2016-06-30 | Discharge: 2016-06-30 | Disposition: A | Discharge status: Home / Self Care | Payer: Self-pay | Attending: Emergency Medicine | Admitting: Emergency Medicine

## 2016-06-30 ENCOUNTER — Encounter: Payer: Self-pay | Admitting: Emergency Medicine

## 2016-06-30 DIAGNOSIS — F1290 Cannabis use, unspecified, uncomplicated: Secondary | ICD-10-CM | POA: Insufficient documentation

## 2016-06-30 DIAGNOSIS — F17210 Nicotine dependence, cigarettes, uncomplicated: Secondary | ICD-10-CM | POA: Insufficient documentation

## 2016-06-30 DIAGNOSIS — F1490 Cocaine use, unspecified, uncomplicated: Secondary | ICD-10-CM | POA: Insufficient documentation

## 2016-06-30 DIAGNOSIS — Z859 Personal history of malignant neoplasm, unspecified: Secondary | ICD-10-CM | POA: Insufficient documentation

## 2016-06-30 DIAGNOSIS — Z79899 Other long term (current) drug therapy: Secondary | ICD-10-CM | POA: Insufficient documentation

## 2016-06-30 DIAGNOSIS — K2900 Acute gastritis without bleeding: Secondary | ICD-10-CM

## 2016-06-30 DIAGNOSIS — R1084 Generalized abdominal pain: Secondary | ICD-10-CM

## 2016-06-30 LAB — URINALYSIS COMPLETE WITH MICROSCOPIC (ARMC ONLY)
Bacteria, UA: NONE SEEN
Bilirubin Urine: NEGATIVE
GLUCOSE, UA: NEGATIVE mg/dL
Hgb urine dipstick: NEGATIVE
KETONES UR: NEGATIVE mg/dL
NITRITE: NEGATIVE
Protein, ur: NEGATIVE mg/dL
SPECIFIC GRAVITY, URINE: 1.005 (ref 1.005–1.030)
pH: 6 (ref 5.0–8.0)

## 2016-06-30 LAB — CBC
HEMATOCRIT: 41.9 % (ref 35.0–47.0)
Hemoglobin: 13.8 g/dL (ref 12.0–16.0)
MCH: 30.5 pg (ref 26.0–34.0)
MCHC: 33.1 g/dL (ref 32.0–36.0)
MCV: 92.2 fL (ref 80.0–100.0)
Platelets: 310 10*3/uL (ref 150–440)
RBC: 4.54 MIL/uL (ref 3.80–5.20)
RDW: 13.8 % (ref 11.5–14.5)
WBC: 9.4 10*3/uL (ref 3.6–11.0)

## 2016-06-30 LAB — BASIC METABOLIC PANEL
ANION GAP: 7 (ref 5–15)
BUN: 10 mg/dL (ref 6–20)
CALCIUM: 9.3 mg/dL (ref 8.9–10.3)
CO2: 26 mmol/L (ref 22–32)
CREATININE: 1.06 mg/dL — AB (ref 0.44–1.00)
Chloride: 106 mmol/L (ref 101–111)
GFR calc Af Amer: >60 mL/min (ref >60)
GFR calc non Af Amer: 58 mL/min — ABNORMAL LOW (ref >60)
GLUCOSE: 81 mg/dL (ref 65–99)
Potassium: 4.1 mmol/L (ref 3.5–5.1)
Sodium: 139 mmol/L (ref 135–145)

## 2016-06-30 LAB — LIPASE, BLOOD: Lipase: 42 U/L (ref 11–51)

## 2016-06-30 LAB — HEPATIC FUNCTION PANEL
ALK PHOS: 74 U/L (ref 38–126)
ALT: 10 U/L — AB (ref 14–54)
AST: 26 U/L (ref 15–41)
Albumin: 3.7 g/dL (ref 3.5–5.0)
BILIRUBIN TOTAL: 0.3 mg/dL (ref 0.3–1.2)
Bilirubin, Direct: <0.1 mg/dL — ABNORMAL LOW (ref 0.1–0.5)
TOTAL PROTEIN: 6.5 g/dL (ref 6.5–8.1)

## 2016-06-30 LAB — TROPONIN I: Troponin I: <0.03 ng/mL (ref <0.03)

## 2016-06-30 MED ORDER — GI COCKTAIL ~~LOC~~
30.0000 mL | Freq: Once | ORAL | Status: AC
  Administered 2016-06-30: 30 mL via ORAL
  Filled 2016-06-30: qty 30

## 2016-06-30 MED ORDER — SUCRALFATE 1 G PO TABS: 1.0000 g | ORAL_TABLET | Freq: Four times a day (QID) | ORAL | 1 refills | Status: DC | PRN

## 2016-06-30 MED ORDER — OXYCODONE-ACETAMINOPHEN 5-325 MG PO TABS
2.0000 | ORAL_TABLET | Freq: Once | ORAL | Status: AC
  Administered 2016-06-30: 2 via ORAL
  Filled 2016-06-30: qty 2

## 2016-06-30 MED ORDER — ONDANSETRON 4 MG PO TBDP: ORAL_TABLET | ORAL | 0 refills | Status: DC

## 2016-06-30 MED ORDER — OMEPRAZOLE MAGNESIUM 20 MG PO TBEC: 20.0000 mg | DELAYED_RELEASE_TABLET | Freq: Every day | ORAL | 1 refills | Status: DC

## 2016-06-30 NOTE — ED Provider Notes (Signed)
[facility] Emergency Department Provider Note  ____________________________________________   First MD Initiated Contact with Patient 06/30/16 2038     (approximate)  I have reviewed the triage vital signs and the nursing notes.   HISTORY  Chief Complaint Abdominal Pain    HPI Nicole Chandler is a 55 y.o. female with a medical history that includes seizures, chronic back pain, depression, panic attacks, and bipolar disorder in addition to opioid use disorder and opioid-induced mood disorder, cocaine use disorder, cannabis use disorder who presents for evaluation of abdominal pain.  She states that she feels "gassy" and like the pain medication she was given for her foot has "messed up my stomach like it did before" (reviewing her chart suggests that the medications involved are NSAIDs).  She endorses N/V x 2.  Sxs started acutely this AM, severe, "everything" makes worse, nothing makes better.  Denies fever/chills, CP, SOB, dysuria.   Past Medical History:  Diagnosis Date  . Bipolar 1 disorder (HCC)   . Cancer (HCC)   . Chronic back pain unk  . Depression   . Panic attacks     Patient Active Problem List   Diagnosis Date Noted  . Seizure (HCC) 03/24/2016  . Substance induced mood disorder (HCC) 03/05/2016  . Opioid use disorder, severe, dependence (HCC) 02/25/2016  . Cocaine use disorder, moderate, dependence (HCC) 02/25/2016  . Tobacco use disorder 02/25/2016  . Opioid-induced mood disorder (HCC) 02/25/2016  . Cannabis use disorder, moderate, dependence (HCC) 02/25/2016    Past Surgical History:  Procedure Laterality Date  . ABDOMINAL SURGERY    . EYE SURGERY      Prior to Admission medications   Medication Sig Start Date End Date Taking? Authorizing Provider  BIOTIN PO Take 1 tablet by mouth daily.    Historical Provider, MD  diclofenac (VOLTAREN) 75 MG EC tablet Take 1 tablet (75 mg total) by mouth 2 (two) times daily. 06/28/16   Charmayne Sheerharles M  Beers, PA-C  lidocaine (LIDODERM) 5 % Place 1 patch onto the skin every 12 (twelve) hours. Remove & Discard patch within 12 hours or as directed by MD 03/24/16 03/24/17  Rebecka ApleyAllison P Webster, MD  Multiple Vitamins-Minerals (MULTIVITAMIN WITH MINERALS) tablet Take 1 tablet by mouth daily.    Historical Provider, MD  omeprazole (PRILOSEC OTC) 20 MG tablet Take 1 tablet (20 mg total) by mouth daily. 06/30/16 06/30/17  Loleta Roseory Murdock Jellison, MD  ondansetron (ZOFRAN ODT) 4 MG disintegrating tablet Allow 1-2 tablets to dissolve in your mouth every 8 hours as needed for nausea/vomiting 06/30/16   Loleta Roseory Deliana Avalos, MD  sucralfate (CARAFATE) 1 g tablet Take 1 tablet (1 g total) by mouth 4 (four) times daily as needed (for abdominal discomfort, nausea, and/or vomiting). 06/30/16   Loleta Roseory Annalisse Minkoff, MD    Allergies Patient has no known allergies.  Family History  Problem Relation Age of Onset  . Alzheimer's disease Mother   . AAA (abdominal aortic aneurysm) Father   . Breast cancer Sister [id number]    Social History Social History  Substance Use Topics  . Smoking status: Current Some Day Smoker    Packs/day: 0.50    Types: Cigarettes    Last attempt to quit: 08/27/2014  . Smokeless tobacco: Never Used  . Alcohol use No    Review of Systems Constitutional: No fever/chills Eyes: No visual changes. ENT: No sore throat. Cardiovascular: Denies chest pain. Respiratory: Denies shortness of breath. Gastrointestinal: generalized abd pain w/ N/V x 3 Genitourinary: Negative for  dysuria. Musculoskeletal: Negative for back pain.  Chronic left foot pain Skin: Negative for rash. Neurological: Negative for headaches, focal weakness or numbness.  10-point ROS otherwise negative.  ____________________________________________   PHYSICAL EXAM:  VITAL SIGNS: ED Triage Vitals  Enc Vitals Group     BP 06/30/16 1928 (!) 140/94     Pulse Rate 06/30/16 1928 73     Resp 06/30/16 1928 18     Temp 06/30/16 1928 98 F (36.7 C)     Temp  Source 06/30/16 1928 Oral     SpO2 06/30/16 1928 96 %     Weight 06/30/16 1928 136 lb (61.7 kg)     Height 06/30/16 1928 5\' 3"  (1.6 m)     Head Circumference --      Peak Flow --      Pain Score 06/30/16 1935 9     Pain Loc --      Pain Edu? --      Excl. in GC? --     Constitutional: Alert and oriented. Well appearing and in no acute distress. Eyes: Conjunctivae are normal. PERRL. EOMI. Head: Atraumatic. Nose: No congestion/rhinnorhea. Mouth/Throat: Mucous membranes are moist.  Oropharynx non-erythematous. Neck: No stridor.  No meningeal signs.   Cardiovascular: Normal rate, regular rhythm. Good peripheral circulation. Grossly normal heart sounds. Respiratory: Normal respiratory effort.  No retractions. Lungs CTAB. Gastrointestinal: Soft with diffuse generalized tenderness to palpation with no guarding or rebound.  No Murphy's sign. Musculoskeletal: No lower extremity tenderness nor edema. No gross deformities of extremities. Neurologic:  Normal speech and language. No gross focal neurologic deficits are appreciated.  Skin:  Skin is warm, dry and intact. No rash noted. Psychiatric: Mood and affect are depressed. Speech and behavior are normal.  ____________________________________________   LABS (all labs ordered are listed, but only abnormal results are displayed)  Labs Reviewed  URINALYSIS COMPLETEWITH MICROSCOPIC ([facility] ONLY) - Abnormal; Notable for the following:       Result Value   Color, Urine STRAW (*)    APPearance CLEAR (*)    Leukocytes, UA TRACE (*)    Squamous Epithelial / LPF 0-5 (*)    All other components within normal limits  BASIC METABOLIC PANEL - Abnormal; Notable for the following:    Creatinine, Ser 1.06 (*)    GFR calc non Af Amer 58 (*)    All other components within normal limits  HEPATIC FUNCTION PANEL - Abnormal; Notable for the following:    ALT 10 (*)    Bilirubin, Direct <0.1 (*)    All other components within normal limits  CBC  LIPASE,  BLOOD  TROPONIN I   ____________________________________________  EKG  ED ECG REPORT I, Brownie Nehme, the attending physician, personally viewed and interpreted this ECG.  Date: 06/30/2016 EKG Time: 19:35 Rate: 79 Rhythm: normal sinus rhythm QRS Axis: normal Intervals: normal ST/T Wave abnormalities: normal Conduction Disturbances: none Narrative Interpretation: unremarkable  ____________________________________________  RADIOLOGY   No results found.  ____________________________________________   PROCEDURES  Procedure(s) performed:   Procedures   Critical Care performed: No ____________________________________________   INITIAL IMPRESSION / ASSESSMENT AND PLAN / ED COURSE  Pertinent labs & imaging results that were available during my care of the patient were reviewed by me and considered in my medical decision making (see chart for details).  Initially before I had the opportunity to fully review the patient's chart and before her LFTs had resultsed, I gave her two Percocet as well as a GI cocktail.  Afterward she stated that she was feeling better and was able to tolerate by mouth intake.  She has essentially normal vital signs except for hypertension, no tachycardia, reassuring lab work, and an improved exam.  She continues to describe her main discomfort as feeling "gassy".  She has no signs or symptoms consistent with biliary colic and she has no evidence of acute or emergent medical condition.  I will treat her for acute gastritis likely secondary to NSAIDs.  I will not prescribe any narcotics for her given her past medical history.   ____________________________________________  FINAL CLINICAL IMPRESSION(S) / ED DIAGNOSES  Final diagnoses:  Acute gastritis, presence of bleeding unspecified, unspecified gastritis type  Generalized abdominal pain     MEDICATIONS GIVEN DURING THIS VISIT:  Medications  oxyCODONE-acetaminophen (PERCOCET/ROXICET) 5-325  MG per tablet 2 tablet (2 tablets Oral Given 06/30/16 2101)  gi cocktail (Maalox,Lidocaine,Donnatal) (30 mLs Oral Given 06/30/16 2102)     NEW OUTPATIENT MEDICATIONS STARTED DURING THIS VISIT:  New Prescriptions   OMEPRAZOLE (PRILOSEC OTC) 20 MG TABLET    Take 1 tablet (20 mg total) by mouth daily.   ONDANSETRON (ZOFRAN ODT) 4 MG DISINTEGRATING TABLET    Allow 1-2 tablets to dissolve in your mouth every 8 hours as needed for nausea/vomiting   SUCRALFATE (CARAFATE) 1 G TABLET    Take 1 tablet (1 g total) by mouth 4 (four) times daily as needed (for abdominal discomfort, nausea, and/or vomiting).    Modified Medications   No medications on file    Discontinued Medications   No medications on file     Note:  This document was prepared using Dragon voice recognition software and may include unintentional dictation errors.    Loleta Roseory Vanetta Rule, MD 06/30/16 2219

## 2016-06-30 NOTE — ED Triage Notes (Signed)
Pt to triage via w/c with no distress noted; c/o generalized abd pain since this morning accomp by N/V x 2; st took gas-x without relief

## 2016-06-30 NOTE — Discharge Instructions (Signed)
Your signs, symptoms, and workup today suggests that you have gastritis, possibly related to the recent medications you are taking for your foot pain.  We recommend that you take the medications prescribed according to the label instructions.  Take over-the-counter Tylenol 1000 mg every 6 hours; that will not her ear stomach like the others would.  Call your primary care doctor tomorrow to schedule the next available follow-up appointment.

## 2016-07-01 ENCOUNTER — Emergency Department: Payer: Self-pay

## 2016-07-01 ENCOUNTER — Emergency Department
Admission: EM | Admit: 2016-07-01 | Discharge: 2016-07-01 | Disposition: A | Discharge status: Home / Self Care | Payer: Self-pay | Attending: Student in an Organized Health Care Education/Training Program | Admitting: Student in an Organized Health Care Education/Training Program

## 2016-07-01 DIAGNOSIS — R1013 Epigastric pain: Secondary | ICD-10-CM | POA: Insufficient documentation

## 2016-07-01 DIAGNOSIS — Z859 Personal history of malignant neoplasm, unspecified: Secondary | ICD-10-CM | POA: Insufficient documentation

## 2016-07-01 DIAGNOSIS — Z79899 Other long term (current) drug therapy: Secondary | ICD-10-CM | POA: Insufficient documentation

## 2016-07-01 DIAGNOSIS — F17210 Nicotine dependence, cigarettes, uncomplicated: Secondary | ICD-10-CM | POA: Insufficient documentation

## 2016-07-01 LAB — CBC WITH DIFFERENTIAL/PLATELET
BASOS ABS: 0.1 10*3/uL (ref 0–0.1)
Basophils Relative: 1 %
EOS ABS: 0.7 10*3/uL (ref 0–0.7)
EOS PCT: 6 %
HCT: 44.4 % (ref 35.0–47.0)
Hemoglobin: 14.6 g/dL (ref 12.0–16.0)
LYMPHS PCT: 23 %
Lymphs Abs: 2.9 10*3/uL (ref 1.0–3.6)
MCH: 30.3 pg (ref 26.0–34.0)
MCHC: 32.8 g/dL (ref 32.0–36.0)
MCV: 92.4 fL (ref 80.0–100.0)
MONO ABS: 1 10*3/uL — AB (ref 0.2–0.9)
Monocytes Relative: 8 %
Neutro Abs: 7.8 10*3/uL — ABNORMAL HIGH (ref 1.4–6.5)
Neutrophils Relative %: 62 %
PLATELETS: 323 10*3/uL (ref 150–440)
RBC: 4.8 MIL/uL (ref 3.80–5.20)
RDW: 14.1 % (ref 11.5–14.5)
WBC: 12.5 10*3/uL — ABNORMAL HIGH (ref 3.6–11.0)

## 2016-07-01 LAB — COMPREHENSIVE METABOLIC PANEL
ALT: 10 U/L — ABNORMAL LOW (ref 14–54)
AST: 27 U/L (ref 15–41)
Albumin: 4.1 g/dL (ref 3.5–5.0)
Alkaline Phosphatase: 75 U/L (ref 38–126)
Anion gap: 6 (ref 5–15)
BUN: 6 mg/dL (ref 6–20)
CHLORIDE: 108 mmol/L (ref 101–111)
CO2: 25 mmol/L (ref 22–32)
Calcium: 9.1 mg/dL (ref 8.9–10.3)
Creatinine, Ser: 0.93 mg/dL (ref 0.44–1.00)
Glucose, Bld: 87 mg/dL (ref 65–99)
POTASSIUM: 4.4 mmol/L (ref 3.5–5.1)
SODIUM: 139 mmol/L (ref 135–145)
Total Bilirubin: <0.1 mg/dL — ABNORMAL LOW (ref 0.3–1.2)
Total Protein: 7.5 g/dL (ref 6.5–8.1)

## 2016-07-01 LAB — TROPONIN I

## 2016-07-01 LAB — LIPASE, BLOOD: LIPASE: 26 U/L (ref 11–51)

## 2016-07-01 MED ORDER — IOPAMIDOL (ISOVUE-300) INJECTION 61%
100.0000 mL | Freq: Once | INTRAVENOUS | Status: AC | PRN
  Administered 2016-07-01: 100 mL via INTRAVENOUS
  Filled 2016-07-01: qty 100

## 2016-07-01 MED ORDER — HYDROCODONE-ACETAMINOPHEN 5-325 MG PO TABS
ORAL_TABLET | ORAL | Status: AC
  Filled 2016-07-01: qty 1

## 2016-07-01 MED ORDER — IOPAMIDOL (ISOVUE-300) INJECTION 61%
30.0000 mL | Freq: Once | INTRAVENOUS | Status: AC | PRN
  Administered 2016-07-01: 30 mL via ORAL
  Filled 2016-07-01: qty 30

## 2016-07-01 MED ORDER — GI COCKTAIL ~~LOC~~
ORAL | Status: AC
  Administered 2016-07-01: 30 mL via ORAL
  Filled 2016-07-01: qty 30

## 2016-07-01 MED ORDER — HALOPERIDOL LACTATE 5 MG/ML IJ SOLN
2.0000 mg | Freq: Once | INTRAMUSCULAR | Status: AC
Start: 2016-07-01 — End: 2016-07-01
  Administered 2016-07-01: 2 mg via INTRAVENOUS
  Filled 2016-07-01: qty 1

## 2016-07-01 MED ORDER — HYDROCODONE-ACETAMINOPHEN 5-325 MG PO TABS
1.0000 | ORAL_TABLET | Freq: Once | ORAL | Status: AC
  Administered 2016-07-01: 1 via ORAL

## 2016-07-01 MED ORDER — GI COCKTAIL ~~LOC~~
30.0000 mL | Freq: Once | ORAL | Status: AC
  Administered 2016-07-01: 30 mL via ORAL

## 2016-07-01 NOTE — Discharge Instructions (Signed)

## 2016-07-01 NOTE — ED Provider Notes (Signed)
[facility] Emergency Department Provider Note    First MD Initiated Contact with Patient 07/01/16 1836     (approximate)  I have reviewed the triage vital signs and the nursing notes.   HISTORY  Chief Complaint Abdominal Pain    HPI Nicole Chandler is a 55 y.o. female  who presents with recurrent epigastric pain. Patient describes it as a burning sensation like someone kicked her in the belly. She denies any recent fevers. She was seen 3 days ago for similar symptoms and was prescribed antacids and Carafate. Patient states that she's had no relief from these medications. Denies any change in the symptoms. She is tearful and anxious in the ER. States that she just wants the pain to go away.  She denies any chest pain or shortness of breath.    Past Medical History:  Diagnosis Date  . Bipolar 1 disorder (HCC)   . Cancer (HCC)   . Chronic back pain unk  . Depression   . Panic attacks     Patient Active Problem List   Diagnosis Date Noted  . Seizure (HCC) 03/24/2016  . Substance induced mood disorder (HCC) 03/05/2016  . Opioid use disorder, severe, dependence (HCC) 02/25/2016  . Cocaine use disorder, moderate, dependence (HCC) 02/25/2016  . Tobacco use disorder 02/25/2016  . Opioid-induced mood disorder (HCC) 02/25/2016  . Cannabis use disorder, moderate, dependence (HCC) 02/25/2016    Past Surgical History:  Procedure Laterality Date  . ABDOMINAL SURGERY    . EYE SURGERY      Prior to Admission medications   Medication Sig Start Date End Date Taking? Authorizing Provider  BIOTIN PO Take 1 tablet by mouth daily.    Historical Provider, MD  diclofenac (VOLTAREN) 75 MG EC tablet Take 1 tablet (75 mg total) by mouth 2 (two) times daily. 06/28/16   Charmayne Sheer Beers, PA-C  lidocaine (LIDODERM) 5 % Place 1 patch onto the skin every 12 (twelve) hours. Remove & Discard patch within 12 hours or as directed by MD 03/24/16 03/24/17  Rebecka Apley, MD    Multiple Vitamins-Minerals (MULTIVITAMIN WITH MINERALS) tablet Take 1 tablet by mouth daily.    Historical Provider, MD  omeprazole (PRILOSEC OTC) 20 MG tablet Take 1 tablet (20 mg total) by mouth daily. 06/30/16 06/30/17  Loleta Rose, MD  ondansetron (ZOFRAN ODT) 4 MG disintegrating tablet Allow 1-2 tablets to dissolve in your mouth every 8 hours as needed for nausea/vomiting 06/30/16   Loleta Rose, MD  sucralfate (CARAFATE) 1 g tablet Take 1 tablet (1 g total) by mouth 4 (four) times daily as needed (for abdominal discomfort, nausea, and/or vomiting). 06/30/16   Loleta Rose, MD    Allergies Patient has no known allergies.  Family History  Problem Relation Age of Onset  . Alzheimer's disease Mother   . AAA (abdominal aortic aneurysm) Father   . Breast cancer Sister 82    Social History Social History  Substance Use Topics  . Smoking status: Current Some Day Smoker    Packs/day: 0.50    Types: Cigarettes    Last attempt to quit: 08/27/2014  . Smokeless tobacco: Never Used  . Alcohol use No    Review of Systems Patient denies headaches, rhinorrhea, blurry vision, numbness, shortness of breath, chest pain, edema, cough, abdominal pain, nausea, vomiting, diarrhea, dysuria, fevers, rashes or hallucinations unless otherwise stated above in HPI. ____________________________________________   PHYSICAL EXAM:  VITAL SIGNS: Vitals:   07/01/16 1711  BP: Marland Kitchen)  147/100  Pulse: 93  Resp: 20  Temp: 98.1 F (36.7 C)    Constitutional: Alert and oriented. Tearful and anxious appearing Eyes: Conjunctivae are normal. PERRL. EOMI. Head: Atraumatic. Nose: No congestion/rhinnorhea. Mouth/Throat: Mucous membranes are moist.  Oropharynx non-erythematous. Neck: No stridor. Painless ROM. No cervical spine tenderness to palpation Hematological/Lymphatic/Immunilogical: No cervical lymphadenopathy. Cardiovascular: Normal rate, regular rhythm. Grossly normal heart sounds.  Good peripheral  circulation. Respiratory: Normal respiratory effort.  No retractions. Lungs CTAB. Gastrointestinal: Soft and nontender. No epigastric ttp. No distention. No abdominal bruits. No CVA tenderness. Genitourinary:  Musculoskeletal: No lower extremity tenderness nor edema.  No joint effusions. Neurologic:  Normal speech and language. No gross focal neurologic deficits are appreciated. No gait instability. Skin:  Skin is warm, dry and intact. No rash noted. Psychiatric: anxious and tearful ____________________________________________   LABS (all labs ordered are listed, but only abnormal results are displayed)  Results for orders placed or performed during the hospital encounter of 06/30/16 (from the past 24 hour(s))  Urinalysis complete, with microscopic ([facility] only)     Status: Abnormal   Collection Time: 06/30/16  7:32 PM  Result Value Ref Range   Color, Urine STRAW (A) YELLOW   APPearance CLEAR (A) CLEAR   Glucose, UA NEGATIVE NEGATIVE mg/dL   Bilirubin Urine NEGATIVE NEGATIVE   Ketones, ur NEGATIVE NEGATIVE mg/dL   Specific Gravity, Urine 1.005 1.005 - 1.030   Hgb urine dipstick NEGATIVE NEGATIVE   pH 6.0 5.0 - 8.0   Protein, ur NEGATIVE NEGATIVE mg/dL   Nitrite NEGATIVE NEGATIVE   Leukocytes, UA TRACE (A) NEGATIVE   RBC / HPF 0-5 0 - 5 RBC/hpf   WBC, UA 0-5 0 - 5 WBC/hpf   Bacteria, UA NONE SEEN NONE SEEN   Squamous Epithelial / LPF 0-5 (A) NONE SEEN  CBC     Status: None   Collection Time: 06/30/16  7:32 PM  Result Value Ref Range   WBC 9.4 3.6 - 11.0 K/uL   RBC 4.54 3.80 - 5.20 MIL/uL   Hemoglobin 13.8 12.0 - 16.0 g/dL   HCT 19.141.9 47.835.0 - 29.547.0 %   MCV 92.2 80.0 - 100.0 fL   MCH 30.5 26.0 - 34.0 pg   MCHC 33.1 32.0 - 36.0 g/dL   RDW 62.113.8 30.811.5 - 65.714.5 %   Platelets 310 150 - 440 K/uL  Basic metabolic panel     Status: Abnormal   Collection Time: 06/30/16  7:32 PM  Result Value Ref Range   Sodium 139 135 - 145 mmol/L   Potassium 4.1 3.5 - 5.1 mmol/L   Chloride 106 101 - 111  mmol/L   CO2 26 22 - 32 mmol/L   Glucose, Bld 81 65 - 99 mg/dL   BUN 10 6 - 20 mg/dL   Creatinine, Ser 8.461.06 (H) 0.44 - 1.00 mg/dL   Calcium 9.3 8.9 - 96.210.3 mg/dL   GFR calc non Af Amer 58 (L) >60 mL/min   GFR calc Af Amer >60 >60 mL/min   Anion gap 7 5 - 15  Lipase, blood     Status: None   Collection Time: 06/30/16  7:32 PM  Result Value Ref Range   Lipase 42 11 - 51 U/L  Troponin I     Status: None   Collection Time: 06/30/16  7:32 PM  Result Value Ref Range   Troponin I <0.03 <0.03 ng/mL  Hepatic function panel     Status: Abnormal   Collection Time: 06/30/16  7:32 PM  Result Value Ref Range   Total Protein 6.5 6.5 - 8.1 g/dL   Albumin 3.7 3.5 - 5.0 g/dL   AST 26 15 - 41 U/L   ALT 10 (L) 14 - 54 U/L   Alkaline Phosphatase 74 38 - 126 U/L   Total Bilirubin 0.3 0.3 - 1.2 mg/dL   Bilirubin, Direct <1.6<0.1 (L) 0.1 - 0.5 mg/dL   Indirect Bilirubin NOT CALCULATED 0.3 - 0.9 mg/dL   ____________________________________________  EKG____________________________________________  RADIOLOGY  I personally reviewed all radiographic images ordered to evaluate for the above acute complaints and reviewed radiology reports and findings.  These findings were personally discussed with the patient.  Please see medical record for radiology report.  ____________________________________________   PROCEDURES  Procedure(s) performed: none    Critical Care performed: no ____________________________________________   INITIAL IMPRESSION / ASSESSMENT AND PLAN / ED COURSE  Pertinent labs & imaging results that were available during my care of the patient were reviewed by me and considered in my medical decision making (see chart for details).  DDX: gastritis, pancreatitis, goo, pna, acs, age, ileus, substance abuse,  Meredeth IdeRosalind M Ray is a 55 y.o. who presents to the ED with Recurrent epigastric pain.  Patient is AFVSS in ED. Exam as above. Given current presentation have considered the above  differential.  Patient well-known to this department. Does not appear to be any acute distress but is very anxious appearing. Her abdominal exam is otherwise reassuring. Based on her lab work showing leukocytosis and no improvement in symptoms will order CT scan to evaluate for acute intra-abdominal process.  The patient will be placed on continuous pulse oximetry and telemetry for monitoring.  Laboratory evaluation will be sent to evaluate for the above complaints.  ,on   Clinical Course as of Jul 01 2124  Tue Jul 01, 2016  2032 Patient was able to tolerate PO and was able to ambulate with a steady gait.  CT imaging with no acute abnormality.     [PR]    Clinical Course User Index [PR] Willy EddyPatrick Choua Chalker, MD    Trop negative.  Patient Remains stable and is ambulating about the room in no acute distress. Discussed follow-up with PCP and GI and to continue taking medications as she does likely have underlying gastritis. No evidence of cholecystitis cholelithiasis.  Patient is requesting additional food to eat and drink. Patient currently stable for outpatient follow-up.  ____________________________________________   FINAL CLINICAL IMPRESSION(S) / ED DIAGNOSES  Final diagnoses:  Epigastric pain      NEW MEDICATIONS STARTED DURING THIS VISIT:  New Prescriptions   No medications on file     Note:  This document was prepared using Dragon voice recognition software and may include unintentional dictation errors.    Willy EddyPatrick Abby Stines, MD 07/01/16 2205

## 2016-07-01 NOTE — ED Triage Notes (Signed)
Pt was seen here on 11/4 dx with osteoarthritis and Rx antiinflammatories , states that day she began having abd pain with N/V/D and came back on 11/6, states medications rx for stomach are not helping..Marland Kitchen

## 2016-07-03 ENCOUNTER — Encounter: Payer: Self-pay | Admitting: Emergency Medicine

## 2016-07-03 ENCOUNTER — Emergency Department
Admission: EM | Admit: 2016-07-03 | Discharge: 2016-07-04 | Disposition: A | Discharge status: Home / Self Care | Payer: Self-pay | Attending: Emergency Medicine | Admitting: Emergency Medicine

## 2016-07-03 DIAGNOSIS — F17210 Nicotine dependence, cigarettes, uncomplicated: Secondary | ICD-10-CM | POA: Insufficient documentation

## 2016-07-03 DIAGNOSIS — Z79899 Other long term (current) drug therapy: Secondary | ICD-10-CM | POA: Insufficient documentation

## 2016-07-03 DIAGNOSIS — R1084 Generalized abdominal pain: Secondary | ICD-10-CM | POA: Insufficient documentation

## 2016-07-03 LAB — URINALYSIS COMPLETE WITH MICROSCOPIC (ARMC ONLY)
BACTERIA UA: NONE SEEN
Bilirubin Urine: NEGATIVE
GLUCOSE, UA: NEGATIVE mg/dL
Ketones, ur: NEGATIVE mg/dL
Nitrite: NEGATIVE
Protein, ur: NEGATIVE mg/dL
Specific Gravity, Urine: 1.005 (ref 1.005–1.030)
pH: 6 (ref 5.0–8.0)

## 2016-07-03 LAB — COMPREHENSIVE METABOLIC PANEL
ALBUMIN: 3.9 g/dL (ref 3.5–5.0)
ALK PHOS: 75 U/L (ref 38–126)
ALT: 22 U/L (ref 14–54)
ANION GAP: 6 (ref 5–15)
AST: 55 U/L — AB (ref 15–41)
BUN: 12 mg/dL (ref 6–20)
CALCIUM: 8.9 mg/dL (ref 8.9–10.3)
CO2: 27 mmol/L (ref 22–32)
Chloride: 104 mmol/L (ref 101–111)
Creatinine, Ser: 1.35 mg/dL — ABNORMAL HIGH (ref 0.44–1.00)
GFR calc Af Amer: 50 mL/min — ABNORMAL LOW (ref >60)
GFR calc non Af Amer: 43 mL/min — ABNORMAL LOW (ref >60)
GLUCOSE: 93 mg/dL (ref 65–99)
POTASSIUM: 4.3 mmol/L (ref 3.5–5.1)
SODIUM: 137 mmol/L (ref 135–145)
Total Bilirubin: 0.5 mg/dL (ref 0.3–1.2)
Total Protein: 7 g/dL (ref 6.5–8.1)

## 2016-07-03 LAB — CBC
HEMATOCRIT: 38.8 % (ref 35.0–47.0)
HEMOGLOBIN: 12.9 g/dL (ref 12.0–16.0)
MCH: 30.3 pg (ref 26.0–34.0)
MCHC: 33.3 g/dL (ref 32.0–36.0)
MCV: 91.2 fL (ref 80.0–100.0)
Platelets: 323 10*3/uL (ref 150–440)
RBC: 4.26 MIL/uL (ref 3.80–5.20)
RDW: 14 % (ref 11.5–14.5)
WBC: 11.3 10*3/uL — ABNORMAL HIGH (ref 3.6–11.0)

## 2016-07-03 LAB — LIPASE, BLOOD: Lipase: 31 U/L (ref 11–51)

## 2016-07-03 MED ORDER — METOCLOPRAMIDE HCL 10 MG PO TABS: 10.0000 mg | ORAL_TABLET | Freq: Four times a day (QID) | ORAL | 0 refills | Status: DC | PRN

## 2016-07-03 MED ORDER — METOCLOPRAMIDE HCL 10 MG PO TABS
10.0000 mg | ORAL_TABLET | Freq: Once | ORAL | Status: AC
  Administered 2016-07-04: 10 mg via ORAL
  Filled 2016-07-03: qty 1

## 2016-07-03 MED ORDER — ALUMINUM-MAGNESIUM-SIMETHICONE 200-200-20 MG/5ML PO SUSP: 30.0000 mL | Freq: Three times a day (TID) | ORAL | 0 refills | Status: DC

## 2016-07-03 MED ORDER — ALUM & MAG HYDROXIDE-SIMETH 200-200-20 MG/5ML PO SUSP
30.0000 mL | Freq: Once | ORAL | Status: AC
  Administered 2016-07-04: 30 mL via ORAL
  Filled 2016-07-03: qty 30

## 2016-07-03 NOTE — ED Provider Notes (Addendum)
[facility] Emergency Department Provider Note  ____________________________________________  Time seen: Approximately 11:50 PM  I have reviewed the triage vital signs and the nursing notes.   HISTORY  Chief Complaint Abdominal Pain    HPI Nicole Chandler is a 55 y.o. female who complains of generalized abdominal pain for the past week. She's been seen in the emergency department 3 times this week for the same. She reports the symptoms started after taking NSAIDs for her foot. She has been taking antacids this week without relief yet. She has called her primary care doctor and has a follow-up appointment in about 2 weeks, and is also been scheduled for GI and colonoscopy later on this year. No vomiting diarrhea or constipation. No fevers or chills.  Patient is still tolerating oral intake. No aggravating or alleviating factors. Colicky, moderate intensity   Past Medical History:  Diagnosis Date  . Bipolar 1 disorder (HCC)   . Cancer (HCC)   . Chronic back pain unk  . Depression   . Panic attacks      Patient Active Problem List   Diagnosis Date Noted  . Seizure (HCC) 03/24/2016  . Substance induced mood disorder (HCC) 03/05/2016  . Opioid use disorder, severe, dependence (HCC) 02/25/2016  . Cocaine use disorder, moderate, dependence (HCC) 02/25/2016  . Tobacco use disorder 02/25/2016  . Opioid-induced mood disorder (HCC) 02/25/2016  . Cannabis use disorder, moderate, dependence (HCC) 02/25/2016     Past Surgical History:  Procedure Laterality Date  . ABDOMINAL SURGERY    . EYE SURGERY       Prior to Admission medications   Medication Sig Start Date End Date Taking? Authorizing Provider  BIOTIN PO Take 1 tablet by mouth daily.    Historical Provider, MD  diclofenac (VOLTAREN) 75 MG EC tablet Take 1 tablet (75 mg total) by mouth 2 (two) times daily. 06/28/16   Charmayne Sheerharles M Beers, PA-C  lidocaine (LIDODERM) 5 % Place 1 patch onto the skin every 12  (twelve) hours. Remove & Discard patch within 12 hours or as directed by MD 03/24/16 03/24/17  Rebecka ApleyAllison P Webster, MD  Multiple Vitamins-Minerals (MULTIVITAMIN WITH MINERALS) tablet Take 1 tablet by mouth daily.    Historical Provider, MD  omeprazole (PRILOSEC OTC) 20 MG tablet Take 1 tablet (20 mg total) by mouth daily. 06/30/16 06/30/17  Loleta Roseory Forbach, MD  ondansetron (ZOFRAN ODT) 4 MG disintegrating tablet Allow 1-2 tablets to dissolve in your mouth every 8 hours as needed for nausea/vomiting 06/30/16   Loleta Roseory Forbach, MD  sucralfate (CARAFATE) 1 g tablet Take 1 tablet (1 g total) by mouth 4 (four) times daily as needed (for abdominal discomfort, nausea, and/or vomiting). 06/30/16   Loleta Roseory Forbach, MD     Allergies Patient has no known allergies.   Family History  Problem Relation Age of Onset  . Alzheimer's disease Mother   . AAA (abdominal aortic aneurysm) Father   . Breast cancer Sister [id number]    Social History Social History  Substance Use Topics  . Smoking status: Current Some Day Smoker    Packs/day: 0.50    Types: Cigarettes    Last attempt to quit: 08/27/2014  . Smokeless tobacco: Never Used  . Alcohol use No    Review of Systems  Constitutional:   No fever or chills.  Cardiovascular:   No chest pain. Respiratory:   No dyspnea or cough. Gastrointestinal:   Positive generalized abdominal pain as above without vomiting or diarrhea  Genitourinary:  Negative for dysuria or difficulty urinating. 10-point ROS otherwise negative.  ____________________________________________   PHYSICAL EXAM:  VITAL SIGNS: ED Triage Vitals  Enc Vitals Group     BP 07/03/16 2157 126/81     Pulse Rate 07/03/16 2157 73     Resp 07/03/16 2157 18     Temp 07/03/16 2157 97.7 F (36.5 C)     Temp Source 07/03/16 2157 Oral     SpO2 07/03/16 2157 97 %     Weight 07/03/16 2156 136 lb (61.7 kg)     Height 07/03/16 2156 5\' 3"  (1.6 m)     Head Circumference --      Peak Flow --      Pain Score 07/03/16  2156 10     Pain Loc --      Pain Edu? --      Excl. in GC? --     Vital signs reviewed, nursing assessments reviewed.   Constitutional:   Alert and oriented. Well appearing and in no distress. Eyes:   No scleral icterus. No conjunctival pallor. PERRL. EOMI.  No nystagmus. ENT   Head:   Normocephalic and atraumatic.   Nose:   No congestion/rhinnorhea. No septal hematoma   Mouth/Throat:   MMM, no pharyngeal erythema. No peritonsillar mass.    Neck:   No stridor. No SubQ emphysema. No meningismus. Hematological/Lymphatic/Immunilogical:   No cervical lymphadenopathy. Cardiovascular:   RRR. Symmetric bilateral radial and DP pulses.  No murmurs.  Respiratory:   Normal respiratory effort without tachypnea nor retractions. Breath sounds are clear and equal bilaterally. No wheezes/rales/rhonchi. Gastrointestinal:   Soft with slight left upper quadrant tenderness. Non distended. There is no CVA tenderness.  No rebound, rigidity, or guarding. Genitourinary:   deferred Musculoskeletal:   Nontender with normal range of motion in all extremities. No joint effusions.  No lower extremity tenderness.  No edema. Neurologic:   Normal speech and language.  CN 2-10 normal. Motor grossly intact. No gross focal neurologic deficits are appreciated.  Skin:    Skin is warm, dry and intact. No rash noted.  No petechiae, purpura, or bullae.  ____________________________________________    LABS (pertinent positives/negatives) (all labs ordered are listed, but only abnormal results are displayed) Labs Reviewed  COMPREHENSIVE METABOLIC PANEL - Abnormal; Notable for the following:       Result Value   Creatinine, Ser 1.35 (*)    AST 55 (*)    GFR calc non Af Amer 43 (*)    GFR calc Af Amer 50 (*)    All other components within normal limits  CBC - Abnormal; Notable for the following:    WBC 11.3 (*)    All other components within normal limits  URINALYSIS COMPLETEWITH MICROSCOPIC ([facility]  ONLY) - Abnormal; Notable for the following:    Color, Urine STRAW (*)    APPearance CLEAR (*)    Hgb urine dipstick 1+ (*)    Leukocytes, UA TRACE (*)    Squamous Epithelial / LPF 0-5 (*)    All other components within normal limits  LIPASE, BLOOD   ____________________________________________   EKG    ____________________________________________    RADIOLOGY    ____________________________________________   PROCEDURES Procedures  ____________________________________________   INITIAL IMPRESSION / ASSESSMENT AND PLAN / ED COURSE  Pertinent labs & imaging results that were available during my care of the patient were reviewed by me and considered in my medical decision making (see chart for details).  Patient well appearing no acute distress.  Presents with generalized abdominal pain. Had a CT scan 2 days ago.Considering the patient's symptoms, medical history, and physical examination today, I have low suspicion for cholecystitis or biliary pathology, pancreatitis, perforation or bowel obstruction, hernia, intra-abdominal abscess, AAA or dissection, volvulus or intussusception, mesenteric ischemia, or appendicitis.  Low suspicion for PID or [employer]. This does appear to be related to gastritis or possibly gas pain. Her history does suggest that she likely has a low pain tolerance and some may be experiencing more severe symptoms than the average person, although she does appear calm and comfortable and not in distress. Advised her to continue following up with her doctor. We'll try Maalox and Reglan for continued relief of symptoms.    ----------------------------------------- 12:43 AM on 07/04/2016 -----------------------------------------  Upon attempting to discharge the patient, the patient has become belligerent and is swearing at the nurse and yelling. Again she is not in distress, ambulatory, tolerating oral intake, energetic. Normal vital signs. Abdomen  is benign. I advised the patient that her symptoms may take a few days to subside but at the present time there is no identifiable pathology. Counseled her on various soothing measures she could take at home.   Clinical Course    ____________________________________________   FINAL CLINICAL IMPRESSION(S) / ED DIAGNOSES  Final diagnoses:  Generalized abdominal pain       Portions of this note were generated with dragon dictation software. Dictation errors may occur despite best attempts at proofreading.    Sharman CheekPhillip Jatia Musa, MD 07/03/16 2354    Sharman CheekPhillip Bahar Shelden, MD 07/04/16 [phone]

## 2016-07-03 NOTE — ED Notes (Signed)
'  I've been smoking a lot and that's not normal for me" "i think it's because I'm in pain" "I smoke pot but that didn't help with the nausea so I haven't anymore since i've been so sick"

## 2016-07-03 NOTE — ED Triage Notes (Addendum)
Pt to triage via w/c with no distress noted; pt c/o mid abd pain and "gas" with no accomp symptoms; has been seen for same here recently x 3

## 2016-07-04 NOTE — ED Notes (Signed)
ED Provider at bedside. 

## 2016-07-04 NOTE — ED Notes (Addendum)
Patient presents back to the STAT desk asking to see the physician who just saw her. This RN advised patient that he had no idea who saw here; patient proceeded to sling paperwork at this RN. RN viewed paperwork and patient begins to yell loudly at this RN. "Do you understand that I just spent $400 to see that motherfucker?" RN asked that the problem was. Patient continued to yell. "He just saw me, but I am still in pain. The fucking medicine that he gave me caused me to have gas. I need to see his ass right now." RN explained to patient that MD was currently in with another patient, however he would take her concerns back to the provider and discuss them for her. RN ended by stating that she would have to wait. Patient states, "Motherfucker do you understand. I am in fucking pain and he gave me that shit. I have gas because of it". RN viewed the prescriptions (Reglan and Maalox) and advised that the  Maalox would help with her gas pains. Patient stated, "That is motherfucking Maalox. I know what it is" as she proceeded to storm towards the exit. Upon exiting, patient states, "Tell that motherfucker to go fuck himself. I spent $400 damn dollars and you aint done shit for me. Keep that motherfucking prescription. I dont need that shit"; prescriptions left at the STAT desk. Proceeds to leave the ED at this time. In speaking with first nurse, this RN was advised that patient has been here several days in the recent past and the behavior has been the same each time. Patient cursed first nurse (Raquel) out earlier; was subsequently asked to leave. Spoke with primary nurse Revonda Standard(Allison) who reports that patient was verbally abusive towards her at the time of discharge citing the fact that the patient as upset because she didn't get the medications that she wanted. Spoke with EDP Scotty Court(Stafford) to make him aware. He has spoken with patient, at her request, several times tonight and that patient has asked for medications that he  was not willing to prescribe for her; aware that patient was upset.

## 2016-07-22 ENCOUNTER — Ambulatory Visit: Payer: Self-pay | Admitting: Pharmacy Technician

## 2016-07-22 DIAGNOSIS — Z79899 Other long term (current) drug therapy: Secondary | ICD-10-CM

## 2016-07-22 NOTE — Progress Notes (Signed)
Completed [facility] application and contract.  Patient agreed to all terms of the [facility] contract.  Patient to provide notarized letter of support and utility bill.  Patient expressed concerns about dealing with grief of Mother.  Referred patient to grief counseling provided by [facility].  Also, provided patient with other community resource material based on her particular needs.    Sherilyn DacostaBetty J. Kluttz Care Manager [facility]

## 2016-08-26 ENCOUNTER — Telehealth: Payer: Self-pay | Admitting: Pharmacy Technician

## 2016-08-26 NOTE — Telephone Encounter (Signed)
Patient failed to provide current utility bill and notarized letter of support.  [facility] without requested [facility].  Patient notified by letter.  Sherilyn DacostaBetty J. Aariona Momon Care Manager [facility]

## 2016-09-30 ENCOUNTER — Encounter: Payer: Self-pay | Admitting: Emergency Medicine

## 2016-09-30 ENCOUNTER — Emergency Department
Admission: EM | Admit: 2016-09-30 | Discharge: 2016-09-30 | Disposition: A | Discharge status: Home / Self Care | Payer: Self-pay | Attending: Emergency Medicine | Admitting: Emergency Medicine

## 2016-09-30 DIAGNOSIS — Z79899 Other long term (current) drug therapy: Secondary | ICD-10-CM | POA: Insufficient documentation

## 2016-09-30 DIAGNOSIS — M5432 Sciatica, left side: Secondary | ICD-10-CM

## 2016-09-30 DIAGNOSIS — F17210 Nicotine dependence, cigarettes, uncomplicated: Secondary | ICD-10-CM | POA: Insufficient documentation

## 2016-09-30 DIAGNOSIS — M5442 Lumbago with sciatica, left side: Secondary | ICD-10-CM | POA: Insufficient documentation

## 2016-09-30 HISTORY — DX: Dorsalgia, unspecified: M54.9

## 2016-09-30 HISTORY — DX: Sciatica, unspecified side: M54.30

## 2016-09-30 MED ORDER — CYCLOBENZAPRINE HCL 5 MG PO TABS: 5.0000 mg | ORAL_TABLET | Freq: Three times a day (TID) | ORAL | 0 refills | Status: DC | PRN

## 2016-09-30 MED ORDER — ORPHENADRINE CITRATE ER 100 MG PO TB12: 100.0000 mg | ORAL_TABLET | Freq: Two times a day (BID) | ORAL | 0 refills | Status: DC | PRN

## 2016-09-30 MED ORDER — KETOROLAC TROMETHAMINE 60 MG/2ML IM SOLN
30.0000 mg | Freq: Once | INTRAMUSCULAR | Status: AC
Start: 2016-09-30 — End: 2016-09-30
  Administered 2016-09-30: 30 mg via INTRAMUSCULAR
  Filled 2016-09-30: qty 2

## 2016-09-30 MED ORDER — ORPHENADRINE CITRATE 30 MG/ML IJ SOLN
60.0000 mg | INTRAMUSCULAR | Status: AC
  Administered 2016-09-30: 60 mg via INTRAMUSCULAR
  Filled 2016-09-30: qty 2

## 2016-09-30 MED ORDER — KETOROLAC TROMETHAMINE 10 MG PO TABS: 10.0000 mg | ORAL_TABLET | Freq: Three times a day (TID) | ORAL | 0 refills | Status: DC

## 2016-09-30 NOTE — ED Triage Notes (Signed)
Patient presents to the ED with chronic back pain exacerbation that became worse yesterday during sex.  Patient states pain radiates into left hip and leg.  Patient reports history of sciatica diagnosis.  Patient appears uncomfortable during triage.

## 2016-09-30 NOTE — Discharge Instructions (Signed)
Take the prescription meds as directed. Follow-up with your provider for continued symptoms.  

## 2016-09-30 NOTE — ED Provider Notes (Signed)
[facility] Emergency Department Provider Note ____________________________________________  Time seen: 1958  I have reviewed the triage vital signs and the nursing notes.  HISTORY  Chief Complaint  Back Pain  HPI Marissa NestleRosalind Morton is a 56 y.o. female visits to the ED for evaluation of a flare of her sciatica. Patient gives a history of chronic low back pain and intermittent sciatic irritation, with onset of sciatic nerve pain on the left leg during intercourse yesterday. She denies any other injury, accident, or trauma. She denies any dysuria, incontinence, distal paresthesias, or foot drop. She normally takes gabapentin for her sciatic nerve irritation. She denies any of the medication administration at this time.  Past Medical History:  Diagnosis Date  . Back pain   . Sciatica     There are no active problems to display for this patient.   Past Surgical History:  Procedure Laterality Date  . EYE SURGERY      Prior to Admission medications   Medication Sig Start Date End Date Taking? Authorizing Provider  gabapentin (NEURONTIN) 300 MG capsule Take 300 mg by mouth 3 (three) times daily.   Yes Historical Provider, MD  cyclobenzaprine (FLEXERIL) 5 MG tablet Take 1 tablet (5 mg total) by mouth 3 (three) times daily as needed for muscle spasms. 09/30/16   Debborah Alonge V Bacon Tlaloc Taddei, PA-C  ketorolac (TORADOL) 10 MG tablet Take 1 tablet (10 mg total) by mouth every 8 (eight) hours. 09/30/16   Charlesetta IvoryJenise V Bacon Surina Storts, PA-C   Allergies Patient has no known allergies.  No family history on file.  Social History Social History  Substance Use Topics  . Smoking status: Current Every Day Smoker    Packs/day: 0.50    Types: Cigarettes  . Smokeless tobacco: Never Used  . Alcohol use No    Review of Systems  Constitutional: Negative for fever. Cardiovascular: Negative for chest pain. Respiratory: Negative for shortness of breath. Gastrointestinal: Negative for  abdominal pain, vomiting and diarrhea. Genitourinary: Negative for dysuria. Musculoskeletal: Positive for back pain and left sciatica. Neurological: Negative for headaches, focal weakness or numbness. ____________________________________________  PHYSICAL EXAM:  VITAL SIGNS: ED Triage Vitals  Enc Vitals Group     BP 09/30/16 1902 124/84     Pulse Rate 09/30/16 1902 80     Resp 09/30/16 1902 18     Temp 09/30/16 1902 98.1 F (36.7 C)     Temp Source 09/30/16 1902 Oral     SpO2 09/30/16 1902 94 %     Weight 09/30/16 1854 140 lb (63.5 kg)     Height 09/30/16 1854 5\' 2"  (1.575 m)     Head Circumference --      Peak Flow --      Pain Score 09/30/16 1854 10     Pain Loc --      Pain Edu? --      Excl. in GC? --     Constitutional: Alert and oriented. Well appearing and in no distress. Head: Normocephalic and atraumatic. Cardiovascular: Normal rate, regular rhythm. Normal distal pulses. Respiratory: Normal respiratory effort. No wheezes/rales/rhonchi. Gastrointestinal: Soft and nontender. No distention. No CVA tenderness. Musculoskeletal: No spinal alignment without midline tenderness, spasm, deformity, or step-off. Normal supine straight leg raise without difficulty. Normal hip range of motion without crepitus. Nontender with normal range of motion in all extremities.  Neurologic: Cranial nerves II through XII grossly intact. Normal LE DTRs bilaterally. Normal toe dorsiflexion. Eversion is noted. Normal gait without ataxia. Normal speech and  language. No gross focal neurologic deficits are appreciated. Psychiatric: Mood and affect are normal. Patient exhibits appropriate insight and judgment. ____________________________________________  PROCEDURES  Toradol 30 mg IM Norflex 60 mg IM ____________________________________________  INITIAL IMPRESSION / ASSESSMENT AND PLAN / ED COURSE  Patient with history of chronic intermittent sciatica with flare of her left-sided sciatic  irritation. Her exam is otherwise benign at this time. She will be discharged with prescriptions for muscle relaxants and anti-inflammatories for acute pain relief. She will follow with primary care provider for ongoing symptom management. ____________________________________________  FINAL CLINICAL IMPRESSION(S) / ED DIAGNOSES  Final diagnoses:  Sciatica of left side      Lissa Hoard, PA-C 09/30/16 2100    Phineas Semen, MD 09/30/16 2135

## 2016-09-30 NOTE — ED Notes (Signed)

## 2016-10-04 ENCOUNTER — Emergency Department: Payer: Self-pay

## 2016-10-04 ENCOUNTER — Emergency Department
Admission: EM | Admit: 2016-10-04 | Discharge: 2016-10-04 | Disposition: A | Discharge status: Home / Self Care | Payer: Self-pay | Attending: Emergency Medicine | Admitting: Emergency Medicine

## 2016-10-04 ENCOUNTER — Encounter: Payer: Self-pay | Admitting: Medical Oncology

## 2016-10-04 DIAGNOSIS — J40 Bronchitis, not specified as acute or chronic: Secondary | ICD-10-CM | POA: Insufficient documentation

## 2016-10-04 DIAGNOSIS — F17210 Nicotine dependence, cigarettes, uncomplicated: Secondary | ICD-10-CM | POA: Insufficient documentation

## 2016-10-04 DIAGNOSIS — Z79899 Other long term (current) drug therapy: Secondary | ICD-10-CM | POA: Insufficient documentation

## 2016-10-04 LAB — INFLUENZA PANEL BY PCR (TYPE A & B)
INFLAPCR: NEGATIVE
Influenza B By PCR: NEGATIVE

## 2016-10-04 MED ORDER — IPRATROPIUM-ALBUTEROL 0.5-2.5 (3) MG/3ML IN SOLN
9.0000 mL | Freq: Once | RESPIRATORY_TRACT | Status: AC
Start: 2016-10-04 — End: 2016-10-04
  Administered 2016-10-04: 9 mL via RESPIRATORY_TRACT
  Filled 2016-10-04: qty 9

## 2016-10-04 MED ORDER — PREDNISONE 20 MG PO TABS
60.0000 mg | ORAL_TABLET | Freq: Once | ORAL | Status: AC
  Administered 2016-10-04: 60 mg via ORAL
  Filled 2016-10-04: qty 3

## 2016-10-04 MED ORDER — PREDNISONE 50 MG PO TABS: ORAL_TABLET | ORAL | 0 refills | Status: DC

## 2016-10-04 MED ORDER — ALBUTEROL SULFATE HFA 108 (90 BASE) MCG/ACT IN AERS: 2.0000 | INHALATION_SPRAY | Freq: Four times a day (QID) | RESPIRATORY_TRACT | 0 refills | Status: DC | PRN

## 2016-10-04 NOTE — ED Notes (Signed)
Report received. Pt in with flu-like symptoms - awaiting for ed dr eval.

## 2016-10-04 NOTE — ED Triage Notes (Signed)
Pt to ed with c/o cough, congestion, fever, states last night she had a seizure in her sleep,.  Reports she has not missed any meds, but knows she had a seizure because she bit her tongue.

## 2016-10-04 NOTE — ED Provider Notes (Signed)
[facility] Emergency Department Provider Note  ____________________________________________   First MD Initiated Contact with Patient 10/04/16 1055     (approximate)  I have reviewed the triage vital signs and the nursing notes.   HISTORY  Chief Complaint Cough; Nasal Congestion; and Seizures   HPI Nicole Chandler is a 56 y.o. female with a history of bipolar disorder as well as panic attacks was presented to the emergency department todaywith a cough as well as nasal congestion. Despite having a triage note that says she is complaining of fever, she denied having any fever to me. She also says that she thinks she had a seizure in her sleep because of tongue bite on the right side of her tongue. Says that she has a known seizure disorder and takes gabapentin for this. She does not know of any known sick contacts. Complains of also a mild sore throat. Denies smoking cigarettes.   Past Medical History:  Diagnosis Date  . Bipolar 1 disorder (HCC)   . Cancer (HCC)   . Chronic back pain unk  . Depression   . Panic attacks     Patient Active Problem List   Diagnosis Date Noted  . Seizure (HCC) 03/24/2016  . Substance induced mood disorder (HCC) 03/05/2016  . Opioid use disorder, severe, dependence (HCC) 02/25/2016  . Cocaine use disorder, moderate, dependence (HCC) 02/25/2016  . Tobacco use disorder 02/25/2016  . Opioid-induced mood disorder (HCC) 02/25/2016  . Cannabis use disorder, moderate, dependence (HCC) 02/25/2016    Past Surgical History:  Procedure Laterality Date  . ABDOMINAL SURGERY    . EYE SURGERY      Prior to Admission medications   Medication Sig Start Date End Date Taking? Authorizing Provider  FLUoxetine (PROZAC) 20 MG capsule Take 20 mg by mouth daily.   Yes Historical Provider, MD  gabapentin (NEURONTIN) 300 MG capsule Take 300 mg by mouth 2 (two) times daily.   Yes Historical Provider, MD  levETIRAcetam (KEPPRA) 500 MG tablet  Take 500 mg by mouth 2 (two) times daily.   Yes Historical Provider, MD  Multiple Vitamins-Minerals (MULTIVITAMIN WITH MINERALS) tablet Take 1 tablet by mouth daily.   Yes Historical Provider, MD  aluminum-magnesium hydroxide-simethicone (MAALOX) 200-200-20 MG/5ML SUSP Take 30 mLs by mouth 4 (four) times daily -  before meals and at bedtime. Patient not taking: Reported on 10/04/2016 07/03/16   Sharman CheekPhillip Stafford, MD  diclofenac (VOLTAREN) 75 MG EC tablet Take 1 tablet (75 mg total) by mouth 2 (two) times daily. Patient not taking: Reported on 10/04/2016 06/28/16   Charmayne Sheerharles M Beers, PA-C  lidocaine (LIDODERM) 5 % Place 1 patch onto the skin every 12 (twelve) hours. Remove & Discard patch within 12 hours or as directed by MD Patient not taking: Reported on 10/04/2016 03/24/16 03/24/17  Rebecka ApleyAllison P Webster, MD  metoCLOPramide (REGLAN) 10 MG tablet Take 1 tablet (10 mg total) by mouth every 6 (six) hours as needed. Patient not taking: Reported on 10/04/2016 07/03/16   Sharman CheekPhillip Stafford, MD  omeprazole (PRILOSEC OTC) 20 MG tablet Take 1 tablet (20 mg total) by mouth daily. Patient not taking: Reported on 10/04/2016 06/30/16 06/30/17  Loleta Roseory Forbach, MD  ondansetron (ZOFRAN ODT) 4 MG disintegrating tablet Allow 1-2 tablets to dissolve in your mouth every 8 hours as needed for nausea/vomiting Patient not taking: Reported on 10/04/2016 06/30/16   Loleta Roseory Forbach, MD  sucralfate (CARAFATE) 1 g tablet Take 1 tablet (1 g total) by mouth 4 (four) times daily  as needed (for abdominal discomfort, nausea, and/or vomiting). Patient not taking: Reported on 10/04/2016 06/30/16   Loleta Rose, MD    Allergies Nsaids  Family History  Problem Relation Age of Onset  . Alzheimer's disease Mother   . AAA (abdominal aortic aneurysm) Father   . Breast cancer Sister 105    Social History Social History  Substance Use Topics  . Smoking status: Current Some Day Smoker    Packs/day: 0.50    Types: Cigarettes    Last attempt to quit:  08/27/2014  . Smokeless tobacco: Never Used  . Alcohol use No    Review of Systems Constitutional: No fever/chills Eyes: No visual changes. ENT: No sore throat. Cardiovascular: Denies chest pain. Respiratory: cough nonproductive Gastrointestinal: No abdominal pain.  No nausea, no vomiting.  No diarrhea.  No constipation. Genitourinary: Negative for dysuria. Musculoskeletal: Negative for back pain. Skin: Negative for rash. Neurological: Negative for headaches, focal weakness or numbness.  10-point ROS otherwise negative.  ____________________________________________   PHYSICAL EXAM:  VITAL SIGNS: ED Triage Vitals [10/04/16 1021]  Enc Vitals Group     BP (!) 134/101     Pulse Rate 76     Resp 20     Temp 98.2 F (36.8 C)     Temp Source Oral     SpO2 96 %     Weight 136 lb (61.7 kg)     Height      Head Circumference      Peak Flow      Pain Score 9     Pain Loc      Pain Edu?      Excl. in GC?     Constitutional: Alert and oriented. Well appearing and in no acute distress. Eyes: Conjunctivae are normal. PERRL. EOMI. Head: Atraumatic.  Tympanic Membranes are normal bilaterally. Nose: No congestion/rhinnorhea. Mouth/Throat: Mucous membranes are moist.  All abrasion to the right side of the tongue about halfway to the distance to the pharynx. No active bleeding. No pus. No pharyngeal erythema or swelling or pus to the tonsils. Neck: No stridor.   Cardiovascular: Normal rate, regular rhythm. Grossly normal heart sounds.  Good peripheral circulation. Respiratory: Normal respiratory effort.  No retractions. Lungs CTAB.  However, with coarse expiratory cough. Gastrointestinal: Soft and nontender. No distention.  Musculoskeletal: No lower extremity tenderness nor edema.  No joint effusions. Neurologic:  Normal speech and language. No gross focal neurologic deficits are appreciated. No gait instability. Skin:  Skin is warm, dry and intact. No rash noted. Psychiatric:  Mood and affect are normal. Speech and behavior are normal.  ____________________________________________   LABS (all labs ordered are listed, but only abnormal results are displayed)  Labs Reviewed  INFLUENZA PANEL BY PCR (TYPE A & B)   ____________________________________________  EKG   ____________________________________________  RADIOLOGY   ____________________________________________   PROCEDURES  Procedure(s) performed:   Procedures  Critical Care performed:   ____________________________________________   INITIAL IMPRESSION / ASSESSMENT AND PLAN / ED COURSE  Pertinent labs & imaging results that were available during my care of the patient were reviewed by me and considered in my medical decision making (see chart for details).  ----------------------------------------- 1:44 PM on 10/04/2016 -----------------------------------------  Decreased expiratory cough and says that she feels that her breathing has improved. Likely bronchitis. We'll discharge with steroids as well as an albuterol inhaler.      ____________________________________________   FINAL CLINICAL IMPRESSION(S) / ED DIAGNOSES  Bronchitis    NEW MEDICATIONS STARTED DURING  THIS VISIT:  New Prescriptions   No medications on file     Note:  This document was prepared using Dragon voice recognition software and may include unintentional dictation errors.    Myrna Blazer, MD 10/04/16 1344

## 2016-10-15 ENCOUNTER — Encounter: Payer: Self-pay | Admitting: Emergency Medicine

## 2016-10-15 ENCOUNTER — Emergency Department
Admission: EM | Admit: 2016-10-15 | Discharge: 2016-10-15 | Disposition: A | Discharge status: Home / Self Care | Payer: Self-pay | Attending: Emergency Medicine | Admitting: Emergency Medicine

## 2016-10-15 DIAGNOSIS — F17210 Nicotine dependence, cigarettes, uncomplicated: Secondary | ICD-10-CM | POA: Insufficient documentation

## 2016-10-15 DIAGNOSIS — M5442 Lumbago with sciatica, left side: Secondary | ICD-10-CM | POA: Insufficient documentation

## 2016-10-15 DIAGNOSIS — M5441 Lumbago with sciatica, right side: Secondary | ICD-10-CM | POA: Insufficient documentation

## 2016-10-15 DIAGNOSIS — Z79899 Other long term (current) drug therapy: Secondary | ICD-10-CM | POA: Insufficient documentation

## 2016-10-15 DIAGNOSIS — G8929 Other chronic pain: Secondary | ICD-10-CM

## 2016-10-15 MED ORDER — BACLOFEN 10 MG PO TABS: 10.0000 mg | ORAL_TABLET | Freq: Three times a day (TID) | ORAL | 0 refills | Status: DC

## 2016-10-15 MED ORDER — NAPROXEN 500 MG PO TABS: 500.0000 mg | ORAL_TABLET | Freq: Two times a day (BID) | ORAL | 0 refills | Status: DC

## 2016-10-15 MED ORDER — METHOCARBAMOL 500 MG PO TABS
750.0000 mg | ORAL_TABLET | Freq: Once | ORAL | Status: AC
  Administered 2016-10-15: 750 mg via ORAL
  Filled 2016-10-15: qty 2

## 2016-10-15 MED ORDER — ORPHENADRINE CITRATE 30 MG/ML IJ SOLN
60.0000 mg | Freq: Two times a day (BID) | INTRAMUSCULAR | Status: DC
  Filled 2016-10-15: qty 2

## 2016-10-15 MED ORDER — KETOROLAC TROMETHAMINE 60 MG/2ML IM SOLN
30.0000 mg | Freq: Once | INTRAMUSCULAR | Status: DC
  Filled 2016-10-15: qty 2

## 2016-10-15 MED ORDER — NAPROXEN 500 MG PO TABS
500.0000 mg | ORAL_TABLET | Freq: Once | ORAL | Status: AC
  Administered 2016-10-15: 500 mg via ORAL
  Filled 2016-10-15: qty 1

## 2016-10-15 NOTE — ED Notes (Signed)
Pt was offer IM meds for pain  But states she does not want "needles"   Provider aware

## 2016-10-15 NOTE — ED Triage Notes (Signed)
Pt with sciatica pain today and hx of same.
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
## 2016-10-15 NOTE — ED Provider Notes (Signed)
[facility] Emergency Department Provider Note ____________________________________________  Time seen: Approximately 3:17 PM  I have reviewed the triage vital signs and the nursing notes.   HISTORY  Chief Complaint Sciatica    HPI Nicole Chandler is a 56 y.o. female who presents to the emergency department for evaluation of sciatica. She has frequent "flares." Current pain started yesterday. No relief with tylenol or ibuprofen. Flexeril causes her "legs to jump," but other muscle relaxers have helped in the past.   Past Medical History:  Diagnosis Date  . Back pain   . Sciatica     There are no active problems to display for this patient.   Past Surgical History:  Procedure Laterality Date  . EYE SURGERY      Prior to Admission medications   Medication Sig Start Date End Date Taking? Authorizing Provider  baclofen (LIORESAL) 10 MG tablet Take 1 tablet (10 mg total) by mouth 3 (three) times daily. 10/15/16   Nicole Pesterari B Treylin Burtch, FNP  gabapentin (NEURONTIN) 300 MG capsule Take 300 mg by mouth 3 (three) times daily.    Historical Provider, MD  ketorolac (TORADOL) 10 MG tablet Take 1 tablet (10 mg total) by mouth every 8 (eight) hours. 09/30/16   Jenise V Bacon Menshew, PA-C  naproxen (NAPROSYN) 500 MG tablet Take 1 tablet (500 mg total) by mouth 2 (two) times daily with a meal. 10/15/16   Nicole Mataya B Makailey Hodgkin, FNP  orphenadrine (NORFLEX) 100 MG tablet Take 1 tablet (100 mg total) by mouth 2 (two) times daily as needed for muscle spasms. 09/30/16   Nicole IvoryJenise V Bacon Menshew, PA-C    Allergies Patient has no known allergies.  No family history on file.  Social History Social History  Substance Use Topics  . Smoking status: Current Every Day Smoker    Packs/day: 0.50    Types: Cigarettes  . Smokeless tobacco: Never Used  . Alcohol use No    Review of Systems Constitutional: No recent illness. Cardiovascular: Denies chest pain or palpitations. Respiratory:  Denies shortness of breath. Musculoskeletal: Pain in lower back with radiation into the lower extremities.  Skin: Negative for rash, wound, lesion. Neurological: Negative for focal weakness or numbness.Negative for change in bowel or bladder habits/incontinence.  ____________________________________________   PHYSICAL EXAM:  VITAL SIGNS: ED Triage Vitals  Enc Vitals Group     BP 10/15/16 1433 119/78     Pulse Rate 10/15/16 1433 80     Resp 10/15/16 1433 20     Temp 10/15/16 1433 98.2 F (36.8 C)     Temp Source 10/15/16 1433 Oral     SpO2 10/15/16 1433 96 %     Weight 10/15/16 1434 140 lb (63.5 kg)     Height --      Head Circumference --      Peak Flow --      Pain Score 10/15/16 1434 10     Pain Loc --      Pain Edu? --      Excl. in GC? --     Constitutional: Alert and oriented. Well appearing and in no acute distress. Eyes: Conjunctivae are normal. EOMI. Head: Atraumatic. Neck: No stridor.  Respiratory: Normal respiratory effort.   Musculoskeletal: Active straight leg raise observed without complaint of increase in pain. Antalgic gait observed. Neurologic:  Normal speech and language. No gross focal neurologic deficits are appreciated. Speech is normal. No gait instability. Skin:  Skin is warm, dry and intact. Atraumatic. Psychiatric: Mood and  affect are normal. Speech and behavior are normal.  ____________________________________________   LABS (all labs ordered are listed, but only abnormal results are displayed)  Labs Reviewed - No data to display ____________________________________________  RADIOLOGY  Not indicated ____________________________________________   PROCEDURES  Procedure(s) performed: None   ____________________________________________   INITIAL IMPRESSION / ASSESSMENT AND PLAN / ED COURSE  56 year old female presenting to the emergency department for treatment of chronic back pain. She refused injections of Toradol and Norflex.  Instead, she was given Naprosyn and Robaxin. She was encouraged to follow up with her specialist at [facility] for further management.  Pertinent labs & imaging results that were available during my care of the patient were reviewed by me and considered in my medical decision making (see chart for details).  ____________________________________________   FINAL CLINICAL IMPRESSION(S) / ED DIAGNOSES  Final diagnoses:  Chronic bilateral low back pain with bilateral sciatica       Nicole Pester, FNP 10/15/16 1722    Nicole Brittle, MD 10/15/16 2350

## 2016-12-16 ENCOUNTER — Encounter: Payer: Self-pay | Admitting: Emergency Medicine

## 2016-12-16 ENCOUNTER — Emergency Department: Payer: Self-pay

## 2016-12-16 ENCOUNTER — Emergency Department
Admission: EM | Admit: 2016-12-16 | Discharge: 2016-12-16 | Disposition: A | Discharge status: Home / Self Care | Payer: Self-pay | Attending: Emergency Medicine | Admitting: Emergency Medicine

## 2016-12-16 DIAGNOSIS — F17210 Nicotine dependence, cigarettes, uncomplicated: Secondary | ICD-10-CM | POA: Insufficient documentation

## 2016-12-16 DIAGNOSIS — R569 Unspecified convulsions: Secondary | ICD-10-CM

## 2016-12-16 DIAGNOSIS — F1290 Cannabis use, unspecified, uncomplicated: Secondary | ICD-10-CM | POA: Insufficient documentation

## 2016-12-16 DIAGNOSIS — G40909 Epilepsy, unspecified, not intractable, without status epilepticus: Secondary | ICD-10-CM | POA: Insufficient documentation

## 2016-12-16 DIAGNOSIS — G43809 Other migraine, not intractable, without status migrainosus: Secondary | ICD-10-CM | POA: Insufficient documentation

## 2016-12-16 DIAGNOSIS — Z79899 Other long term (current) drug therapy: Secondary | ICD-10-CM | POA: Insufficient documentation

## 2016-12-16 HISTORY — DX: Unspecified convulsions: R56.9

## 2016-12-16 LAB — BASIC METABOLIC PANEL
Anion gap: 8 (ref 5–15)
BUN: 10 mg/dL (ref 6–20)
CALCIUM: 9.2 mg/dL (ref 8.9–10.3)
CHLORIDE: 106 mmol/L (ref 101–111)
CO2: 22 mmol/L (ref 22–32)
CREATININE: 0.88 mg/dL (ref 0.44–1.00)
GFR calc Af Amer: >60 mL/min (ref >60)
GFR calc non Af Amer: >60 mL/min (ref >60)
Glucose, Bld: 99 mg/dL (ref 65–99)
Potassium: 3.7 mmol/L (ref 3.5–5.1)
SODIUM: 136 mmol/L (ref 135–145)

## 2016-12-16 LAB — CBC
HCT: 42.2 % (ref 35.0–47.0)
HEMOGLOBIN: 14 g/dL (ref 12.0–16.0)
MCH: 29.7 pg (ref 26.0–34.0)
MCHC: 33.2 g/dL (ref 32.0–36.0)
MCV: 89.4 fL (ref 80.0–100.0)
PLATELETS: 430 10*3/uL (ref 150–440)
RBC: 4.73 MIL/uL (ref 3.80–5.20)
RDW: 14.4 % (ref 11.5–14.5)
WBC: 10.4 10*3/uL (ref 3.6–11.0)

## 2016-12-16 MED ORDER — DIPHENHYDRAMINE HCL 50 MG/ML IJ SOLN
25.0000 mg | Freq: Once | INTRAMUSCULAR | Status: AC
Start: 2016-12-16 — End: 2016-12-16
  Administered 2016-12-16: 25 mg via INTRAVENOUS

## 2016-12-16 MED ORDER — KETOROLAC TROMETHAMINE 30 MG/ML IJ SOLN
INTRAMUSCULAR | Status: AC
  Filled 2016-12-16: qty 1

## 2016-12-16 MED ORDER — PROCHLORPERAZINE EDISYLATE 5 MG/ML IJ SOLN
INTRAMUSCULAR | Status: AC
  Filled 2016-12-16: qty 2

## 2016-12-16 MED ORDER — ONDANSETRON 4 MG PO TBDP
ORAL_TABLET | ORAL | Status: AC
  Filled 2016-12-16: qty 1

## 2016-12-16 MED ORDER — SODIUM CHLORIDE 0.9 % IV BOLUS (SEPSIS)
1000.0000 mL | Freq: Once | INTRAVENOUS | Status: AC
  Administered 2016-12-16: 1000 mL via INTRAVENOUS

## 2016-12-16 MED ORDER — PROCHLORPERAZINE EDISYLATE 5 MG/ML IJ SOLN
10.0000 mg | Freq: Once | INTRAMUSCULAR | Status: AC
  Administered 2016-12-16: 10 mg via INTRAVENOUS

## 2016-12-16 MED ORDER — KETOROLAC TROMETHAMINE 30 MG/ML IJ SOLN
30.0000 mg | Freq: Once | INTRAMUSCULAR | Status: AC
  Administered 2016-12-16: 30 mg via INTRAVENOUS

## 2016-12-16 MED ORDER — ONDANSETRON 4 MG PO TBDP
4.0000 mg | ORAL_TABLET | Freq: Once | ORAL | Status: AC | PRN
  Administered 2016-12-16: 4 mg via ORAL

## 2016-12-16 MED ORDER — IBUPROFEN 800 MG PO TABS: 800.0000 mg | ORAL_TABLET | Freq: Three times a day (TID) | ORAL | 0 refills | Status: DC | PRN

## 2016-12-16 MED ORDER — DIPHENHYDRAMINE HCL 50 MG/ML IJ SOLN
INTRAMUSCULAR | Status: AC
  Filled 2016-12-16: qty 1

## 2016-12-16 NOTE — Discharge Instructions (Signed)
Please drink plenty of fluids stay well-hydrated and to prevent future headaches. Get plenty of rest and use small regular meals for the day. You may take Motrin or Tylenol for your headache.  Turn to the emergency department for severe pain, numbness tingling or weakness, seizure, fever, or any other symptoms concerning to you.

## 2016-12-16 NOTE — ED Notes (Signed)
Pt reports a headache for 4 days .  Pt taking otc meds without relief.  Pt reports she had a seizure 3 days ago.  No seizure meds .  Pt alert  Speech clear.  Pt ambulates without diff.

## 2016-12-16 NOTE — ED Triage Notes (Addendum)
Patient with complaint of headache that started last night. Patient reports that she has vomited times 4 today from the pain. Patient denies history of migraines. Patient with symmetrical smile, equal hand grips and pupils are equal and reactive.

## 2016-12-16 NOTE — ED Provider Notes (Signed)
[facility] Emergency Department Provider Note  ____________________________________________  Time seen: Approximately 9:04 PM  I have reviewed the triage vital signs and the nursing notes.   HISTORY  Chief Complaint Headache    HPI Nicole Chandler is a 56 y.o. female with a history of skin "stress related seizures," not currently on antiepileptic medications, chronic sciatica on gabapentin, depression, presenting for headache. The patient reports that on Sunday night "I think I might have had a seizure in my sleep." She thinks is because the right side of her tongue is sore, but her partner with whom she sleeps did not notice any tonic-clonic activity.The patient reports she was watching television yesterday when she developed a progressively worsening frontal headache with photosensitivity. She denies any visual changes, numbness tingling or weakness, or difficulty walking. No trauma, she is not anticoagulated, and this was not a thunderclap headache. She tried Tylenol, gabapentin, and BC powder and vomited with each one. She denies any fever or chills, neck pain or stiffness, or tick bites.  The patient reports that it has been years since she has had a seizure or since she has seen a neurologist, but does have an appointment to see a neurologist at [facility] tomorrow. She states that her primary care physician has ordered imaging of her head for tomorrow, but that she has not been having any symptoms and she does not know why this is ordered. The history surrounding her recent medical encounters and imaging orders is very unclear.   Past Medical History:  Diagnosis Date  . Back pain   . Depression   . Sciatica   . Seizures (HCC)     There are no active problems to display for this patient.   Past Surgical History:  Procedure Laterality Date  . EYE SURGERY      Current Outpatient Rx  . Order #: [id number] Class: Print  . Order #: [id number] Class: Historical  Med  . Order #: [id number] Class: Print  . Order #: [id number] Class: Print  . Order #: [id number] Class: Print  . Order #: [id number] Class: Print    Allergies Patient has no known allergies.  No family history on file.  Social History Social History  Substance Use Topics  . Smoking status: Current Every Day Smoker    Packs/day: 0.50    Types: Cigarettes  . Smokeless tobacco: Never Used  . Alcohol use No    Review of Systems Constitutional: No fever/chills.No trauma. No lightheadedness or syncope. Eyes: No visual changes. No blurred or double vision. Positive photosensitivity. ENT: No sore throat. No congestion or rhinorrhea. No neck pain or stiffness. Cardiovascular: Denies chest pain. Denies palpitations. Respiratory: Denies shortness of breath.  No cough. Gastrointestinal: No abdominal pain.  No nausea, no vomiting.  No diarrhea.  No constipation. Genitourinary: Negative for dysuria. Musculoskeletal: Negative for back pain. Skin: Negative for rash. Neurological: Negative for headaches. No focal numbness, tingling or weakness. Possible seizure.  10-point ROS otherwise negative.  ____________________________________________   PHYSICAL EXAM:  VITAL SIGNS: ED Triage Vitals  Enc Vitals Group     BP 12/16/16 2012 (!) 138/101     Pulse Rate 12/16/16 2012 81     Resp 12/16/16 2012 18     Temp 12/16/16 2012 98.5 F (36.9 C)     Temp Source 12/16/16 2012 Oral     SpO2 12/16/16 2012 97 %     Weight 12/16/16 2013 134 lb (60.8 kg)     Height 12/16/16 2013  (1.575 m)  Head Circumference --      Peak Flow --      Pain Score 12/16/16 2012 8     Pain Loc --      Pain Edu? --      Excl. in GC? --     Constitutional: Alert and oriented. Uncomfortable appearing but nontoxic. Answers questions appropriately. GCS is 15.  Eyes: Conjunctivae are normal.  EOMI. PERRLA. No horizontal or vertical nystagmus. No scleral icterus. Head: Atraumatic. No raccoon eyes or Battle  sign. Nose: No congestion/rhinnorhea. Mouth/Throat: Mucous membranes are moist.  Neck: No stridor.  Supple.  No JVD. No meningismus. Full range of motion of the neck without pain. Cardiovascular: Normal rate, regular rhythm. No murmurs, rubs or gallops.  Respiratory: Normal respiratory effort.  No accessory muscle use or retractions. Lungs CTAB.  No wheezes, rales or ronchi. Gastrointestinal: Soft, nontender and nondistended.  No guarding or rebound.  No peritoneal signs. Musculoskeletal: No LE edema.  Neurologic:  A&Ox3.  Speech is clear.  Face and smile are symmetric.  EOMI.  PERRLA. Positive photosensitivity. Moves all extremities well. Normal gait without ataxia. Skin:  Skin is warm, dry and intact. No rash noted. Psychiatric: Mood and affect are normal. Speech and behavior are normal.  Normal judgement.  ____________________________________________   LABS (all labs ordered are listed, but only abnormal results are displayed)  Labs Reviewed  CBC  BASIC METABOLIC PANEL   ____________________________________________  EKG  Not indicated ____________________________________________  RADIOLOGY  Ct Head Wo Contrast  Result Date: 12/16/2016 CLINICAL DATA:  Headache and vomiting beginning last night. History of seizures. EXAM: CT HEAD WITHOUT CONTRAST TECHNIQUE: Contiguous axial images were obtained from the base of the skull through the vertex without intravenous contrast. COMPARISON:  None. FINDINGS: BRAIN: No intraparenchymal hemorrhage, mass effect nor midline shift. The ventricles and sulci are normal. No acute large vascular territory infarcts. No abnormal extra-axial fluid collections. Basal cisterns are patent. VASCULAR: Unremarkable. SKULL/SOFT TISSUES: No skull fracture. No significant soft tissue swelling. ORBITS/SINUSES: The included ocular globes and orbital contents are normal.Trace paranasal sinus mucosal thickening without air-fluid levels. Mastoid air cells are well  aerated. OTHER: None. IMPRESSION: Normal noncontrast CT HEAD. Electronically Signed   By: Awilda Metro M.D.   On: 12/16/2016 20:56    ____________________________________________   PROCEDURES  Procedure(s) performed: None  Procedures  Critical Care performed: No ____________________________________________   INITIAL IMPRESSION / ASSESSMENT AND PLAN / ED COURSE  Pertinent labs & imaging results that were available during my care of the patient were reviewed by me and considered in my medical decision making (see chart for details).  56 y.o. female presenting with progressively worsening headache associated with photosensitivity, after a possible seizure. At this time, the patient is no focal neurologic deficits. She is mildly hypertensive on arrival, but will treat her symptoms and recheck her blood pressure. I'll get basic labs. A CT scan was ordered from triage, which does not show any acute process. Plan reevaluation for final disposition.  ----------------------------------------- 10:19 PM on 12/16/2016 -----------------------------------------  Patient's workup in the emergency department has been reassuring. She has remained hemodynamically stable. Her CT scan does not show any acute intracranial process. Her laboratory studies are within normal limits. Her pain has completely resolved with symptomatic treatment. She has appointment with her primary care physician, as well as her neurologist, tomorrow. At this time, the patient is stable for discharge. Return precautions as well as follow-up instructions were discussed.  ____________________________________________  FINAL CLINICAL IMPRESSION(S) / ED  DIAGNOSES  Final diagnoses:  Other migraine without status migrainosus, not intractable  Seizure (HCC)         NEW MEDICATIONS STARTED DURING THIS VISIT:  New Prescriptions   IBUPROFEN (ADVIL,MOTRIN) 800 MG TABLET    Take 1 tablet (800 mg total) by mouth every 8  (eight) hours as needed for headache (with food).      Rockne Menghini, MD 12/16/16 2220

## 2016-12-17 ENCOUNTER — Ambulatory Visit: Payer: Self-pay | Attending: Oncology

## 2017-01-24 ENCOUNTER — Encounter: Payer: Self-pay | Admitting: Medical Oncology

## 2017-01-24 ENCOUNTER — Emergency Department
Admission: EM | Admit: 2017-01-24 | Discharge: 2017-01-24 | Disposition: A | Discharge status: Home / Self Care | Payer: Self-pay | Attending: Emergency Medicine | Admitting: Emergency Medicine

## 2017-01-24 DIAGNOSIS — F17210 Nicotine dependence, cigarettes, uncomplicated: Secondary | ICD-10-CM | POA: Insufficient documentation

## 2017-01-24 DIAGNOSIS — K0510 Chronic gingivitis, plaque induced: Secondary | ICD-10-CM | POA: Insufficient documentation

## 2017-01-24 MED ORDER — MAGIC MOUTHWASH W/LIDOCAINE: 10.0000 mL | Freq: Four times a day (QID) | ORAL | 0 refills | Status: DC | PRN

## 2017-01-24 MED ORDER — PENICILLIN V POTASSIUM 500 MG PO TABS: 500.0000 mg | ORAL_TABLET | Freq: Three times a day (TID) | ORAL | 0 refills | Status: DC

## 2017-01-24 NOTE — ED Notes (Signed)
See triage note  States she had a tooth extracted to right and then also lost 2 front teeth this week  States having increased pain   unable to f/u her her dentist until next weds

## 2017-01-24 NOTE — ED Notes (Signed)
Pt verbalized understanding of discharge instructions. NAD at this time. 

## 2017-01-24 NOTE — ED Provider Notes (Signed)
[facility] Emergency Department Provider Note ____________________________________________  Time seen: Approximately 12:19 PM  I have reviewed the triage vital signs and the nursing notes.   HISTORY  Chief Complaint Dental Pain   HPI Nicole Chandler is a 56 y.o. female who presents to the emergency department for evaluation of dental pain. She has had pain for the past 2 days. She's had no relief with over-the-counter pain relievers. She states that her dentist is out of town until Wednesday.  Past Medical History:  Diagnosis Date  . Bipolar 1 disorder (HCC)   . Cancer (HCC)   . Chronic back pain unk  . Depression   . Panic attacks     Patient Active Problem List   Diagnosis Date Noted  . Seizure (HCC) 03/24/2016  . Substance induced mood disorder (HCC) 03/05/2016  . Opioid use disorder, severe, dependence (HCC) 02/25/2016  . Cocaine use disorder, moderate, dependence (HCC) 02/25/2016  . Tobacco use disorder 02/25/2016  . Opioid-induced mood disorder (HCC) 02/25/2016  . Cannabis use disorder, moderate, dependence (HCC) 02/25/2016    Past Surgical History:  Procedure Laterality Date  . ABDOMINAL SURGERY    . EYE SURGERY      Prior to Admission medications   Medication Sig Start Date End Date Taking? Authorizing Provider  albuterol (PROVENTIL HFA;VENTOLIN HFA) 108 (90 Base) MCG/ACT inhaler Inhale 2 puffs into the lungs every 6 (six) hours as needed. 10/04/16   Myrna BlazerSchaevitz, David Matthew, MD  aluminum-magnesium hydroxide-simethicone (MAALOX) 200-200-20 MG/5ML SUSP Take 30 mLs by mouth 4 (four) times daily -  before meals and at bedtime. Patient not taking: Reported on 10/04/2016 07/03/16   Sharman CheekStafford, Phillip, MD  diclofenac (VOLTAREN) 75 MG EC tablet Take 1 tablet (75 mg total) by mouth 2 (two) times daily. Patient not taking: Reported on 10/04/2016 06/28/16   Evangeline DakinBeers, Charles M, PA-C  FLUoxetine (PROZAC) 20 MG capsule Take 20 mg by mouth daily.     [provider]  gabapentin (NEURONTIN) 300 MG capsule Take 300 mg by mouth 2 (two) times daily.    [provider]  levETIRAcetam (KEPPRA) 500 MG tablet Take 500 mg by mouth 2 (two) times daily.    [provider]  lidocaine (LIDODERM) 5 % Place 1 patch onto the skin every 12 (twelve) hours. Remove & Discard patch within 12 hours or as directed by MD Patient not taking: Reported on 10/04/2016 03/24/16 03/24/17  Rebecka ApleyWebster, Allison P, MD  magic mouthwash w/lidocaine SOLN Take 10 mLs by mouth 4 (four) times daily as needed for mouth pain. 01/24/17   Finis Hendricksen, Rulon Eisenmengerari B, FNP  metoCLOPramide (REGLAN) 10 MG tablet Take 1 tablet (10 mg total) by mouth every 6 (six) hours as needed. Patient not taking: Reported on 10/04/2016 07/03/16   Sharman CheekStafford, Phillip, MD  Multiple Vitamins-Minerals (MULTIVITAMIN WITH MINERALS) tablet Take 1 tablet by mouth daily.    [provider]  omeprazole (PRILOSEC OTC) 20 MG tablet Take 1 tablet (20 mg total) by mouth daily. Patient not taking: Reported on 10/04/2016 06/30/16 06/30/17  Loleta RoseForbach, Cory, MD  ondansetron (ZOFRAN ODT) 4 MG disintegrating tablet Allow 1-2 tablets to dissolve in your mouth every 8 hours as needed for nausea/vomiting Patient not taking: Reported on 10/04/2016 06/30/16   Loleta RoseForbach, Cory, MD  penicillin v potassium (VEETID) 500 MG tablet Take 1 tablet (500 mg total) by mouth 3 (three) times daily. 01/24/17   Travor Royce, Rulon Eisenmengerari B, FNP  predniSONE (DELTASONE) 50 MG tablet Take 1 tab PO daily 10/04/16  Myrna Blazer, MD  sucralfate (CARAFATE) 1 g tablet Take 1 tablet (1 g total) by mouth 4 (four) times daily as needed (for abdominal discomfort, nausea, and/or vomiting). Patient not taking: Reported on 10/04/2016 06/30/16   Loleta Rose, MD    Allergies Nsaids  Family History  Problem Relation Age of Onset  . Alzheimer's disease Mother   . AAA (abdominal aortic aneurysm) Father   . Breast cancer Sister 64    Social  History Social History  Substance Use Topics  . Smoking status: Current Some Day Smoker    Packs/day: 0.50    Types: Cigarettes    Last attempt to quit: 08/27/2014  . Smokeless tobacco: Never Used  . Alcohol use No    Review of Systems Constitutional: Well appearing ENT: Negative for dysphasia Musculoskeletal: Negative for trismus  Skin: Negative for swelling or wound in the mouth ____________________________________________   PHYSICAL EXAM:  VITAL SIGNS: ED Triage Vitals  Enc Vitals Group     BP 01/24/17 1157 (!) 136/100     Pulse Rate 01/24/17 1157 (!) 103     Resp 01/24/17 1157 18     Temp 01/24/17 1157 98.3 F (36.8 C)     Temp Source 01/24/17 1157 Oral     SpO2 01/24/17 1157 98 %     Weight 01/24/17 1158 136 lb (61.7 kg)     Height --      Head Circumference --      Peak Flow --      Pain Score 01/24/17 1157 10     Pain Loc --      Pain Edu? --      Excl. in GC? --     Constitutional: Alert and oriented. Well appearing and in no acute distress. Eyes: Conjunctivae are clear without drainage or discharge Mouth/Throat: Airways patent Periodontal Exam    Respiratory: Respirations are even and unlabored Musculoskeletal: No trismus Neurologic: Alert, oriented with GCS of 15.  Skin:  Atraumatic Psychiatric: Making crying sound without tear production.  ____________________________________________   LABS (all labs ordered are listed, but only abnormal results are displayed)  Labs Reviewed - No data to display ____________________________________________   RADIOLOGY  Not indicated ____________________________________________   PROCEDURES  Procedure(s) performed: None  Critical Care performed: No ____________________________________________   INITIAL IMPRESSION / ASSESSMENT AND PLAN / ED COURSE  Nicole Chandler is a 56 y.o. female who presents to the emergency department for evaluation of dental pain that started 2 days ago. She will be treated  with penicillin VK and magic mouthwash. She was instructed to follow-up with her dentist as soon as possible. She was instructed to return to the emergency department for symptoms that change or worsen if she is unable schedule an appointment.  Pertinent labs & imaging results that were available during my care of the patient were reviewed by me and considered in my medical decision making (see chart for details).  ____________________________________________   FINAL CLINICAL IMPRESSION(S) / ED DIAGNOSES  Final diagnoses:  Gingivitis    New Prescriptions   MAGIC MOUTHWASH W/LIDOCAINE SOLN    Take 10 mLs by mouth 4 (four) times daily as needed for mouth pain.   PENICILLIN V POTASSIUM (VEETID) 500 MG TABLET    Take 1 tablet (500 mg total) by mouth 3 (three) times daily.    If controlled substance prescribed during this visit, 12 month history viewed on the [place] prior to issuing an initial prescription for Schedule II or III opiod.  Note:  This document was prepared using Dragon voice recognition software and may include unintentional dictation errors.    [name], FNP 01/24/17 1235    Sharman Cheek, MD 01/31/17 2024

## 2017-01-24 NOTE — ED Triage Notes (Signed)
Pt reports dental pain since Thursday.

## 2017-04-09 ENCOUNTER — Emergency Department
Admission: EM | Admit: 2017-04-09 | Discharge: 2017-04-09 | Disposition: A | Discharge status: Home / Self Care | Payer: Self-pay | Attending: Emergency Medicine | Admitting: Emergency Medicine

## 2017-04-09 ENCOUNTER — Encounter: Payer: Self-pay | Admitting: Emergency Medicine

## 2017-04-09 DIAGNOSIS — S161XXA Strain of muscle, fascia and tendon at neck level, initial encounter: Secondary | ICD-10-CM | POA: Insufficient documentation

## 2017-04-09 DIAGNOSIS — M5412 Radiculopathy, cervical region: Secondary | ICD-10-CM | POA: Insufficient documentation

## 2017-04-09 DIAGNOSIS — Z79899 Other long term (current) drug therapy: Secondary | ICD-10-CM | POA: Insufficient documentation

## 2017-04-09 DIAGNOSIS — W010XXA Fall on same level from slipping, tripping and stumbling without subsequent striking against object, initial encounter: Secondary | ICD-10-CM | POA: Insufficient documentation

## 2017-04-09 DIAGNOSIS — Y999 Unspecified external cause status: Secondary | ICD-10-CM | POA: Insufficient documentation

## 2017-04-09 DIAGNOSIS — X500XXA Overexertion from strenuous movement or load, initial encounter: Secondary | ICD-10-CM | POA: Insufficient documentation

## 2017-04-09 DIAGNOSIS — Y93H2 Activity, gardening and landscaping: Secondary | ICD-10-CM | POA: Insufficient documentation

## 2017-04-09 DIAGNOSIS — F17210 Nicotine dependence, cigarettes, uncomplicated: Secondary | ICD-10-CM | POA: Insufficient documentation

## 2017-04-09 DIAGNOSIS — Y92007 Garden or yard of unspecified non-institutional (private) residence as the place of occurrence of the external cause: Secondary | ICD-10-CM | POA: Insufficient documentation

## 2017-04-09 MED ORDER — ORPHENADRINE CITRATE 30 MG/ML IJ SOLN
60.0000 mg | Freq: Once | INTRAMUSCULAR | Status: AC
  Administered 2017-04-09: 60 mg via INTRAMUSCULAR
  Filled 2017-04-09: qty 2

## 2017-04-09 MED ORDER — MELOXICAM 15 MG PO TABS: 15.0000 mg | ORAL_TABLET | Freq: Every day | ORAL | 0 refills | Status: DC

## 2017-04-09 MED ORDER — METHOCARBAMOL 500 MG PO TABS: 500.0000 mg | ORAL_TABLET | Freq: Four times a day (QID) | ORAL | 0 refills | Status: DC

## 2017-04-09 MED ORDER — KETOROLAC TROMETHAMINE 30 MG/ML IJ SOLN
30.0000 mg | Freq: Once | INTRAMUSCULAR | Status: AC
  Administered 2017-04-09: 30 mg via INTRAMUSCULAR
  Filled 2017-04-09: qty 1

## 2017-04-09 NOTE — ED Triage Notes (Addendum)
Pt reports history of pinched nerve. Pt reports left neck and shoulder pain and muscle spasms began today after working out in the garden. Denies CP or SOB. Pt ambulatory to triage.

## 2017-04-09 NOTE — ED Provider Notes (Signed)
[facility] Emergency Department Provider Note  ____________________________________________  Time seen: Approximately 7:37 PM  I have reviewed the triage vital signs and the nursing notes.   HISTORY  Chief Complaint Back Pain and Spasms    HPI Nicole Chandler is a 56 y.o. female presents to emergency department complaining of radicular symptoms on her left arm. Patient reports that she has a pinched nerve in her neck and was doing some heavy lifting as well as tripped over her dog today. Patient denies any direct trauma to the head or neck. She reports pain radiating from her left shoulder into her left hand. No Medications prior to arrival. Patient did try heat with some relief.no other complaints at this time.   Past Medical History:  Diagnosis Date  . Bipolar 1 disorder (HCC)   . Cancer (HCC)   . Chronic back pain unk  . Depression   . Panic attacks     Patient Active Problem List   Diagnosis Date Noted  . Seizure (HCC) 03/24/2016  . Substance induced mood disorder (HCC) 03/05/2016  . Opioid use disorder, severe, dependence (HCC) 02/25/2016  . Cocaine use disorder, moderate, dependence (HCC) 02/25/2016  . Tobacco use disorder 02/25/2016  . Opioid-induced mood disorder (HCC) 02/25/2016  . Cannabis use disorder, moderate, dependence (HCC) 02/25/2016    Past Surgical History:  Procedure Laterality Date  . ABDOMINAL SURGERY    . EYE SURGERY      Prior to Admission medications   Medication Sig Start Date End Date Taking? Authorizing Provider  albuterol (PROVENTIL HFA;VENTOLIN HFA) 108 (90 Base) MCG/ACT inhaler Inhale 2 puffs into the lungs every 6 (six) hours as needed. 10/04/16   Myrna Blazer, MD  aluminum-magnesium hydroxide-simethicone (MAALOX) 200-200-20 MG/5ML SUSP Take 30 mLs by mouth 4 (four) times daily -  before meals and at bedtime. Patient not taking: Reported on 10/04/2016 07/03/16   Sharman Cheek, MD  diclofenac  (VOLTAREN) 75 MG EC tablet Take 1 tablet (75 mg total) by mouth 2 (two) times daily. Patient not taking: Reported on 10/04/2016 06/28/16   Evangeline Dakin, PA-C  FLUoxetine (PROZAC) 20 MG capsule Take 20 mg by mouth daily.    [provider]  gabapentin (NEURONTIN) 300 MG capsule Take 300 mg by mouth 2 (two) times daily.    [provider]  levETIRAcetam (KEPPRA) 500 MG tablet Take 500 mg by mouth 2 (two) times daily.    [provider]  magic mouthwash w/lidocaine SOLN Take 10 mLs by mouth 4 (four) times daily as needed for mouth pain. 01/24/17   Triplett, Rulon Eisenmenger B, FNP  meloxicam (MOBIC) 15 MG tablet Take 1 tablet (15 mg total) by mouth daily. 04/09/17   Chevon Fomby, Delorise Royals, PA-C  methocarbamol (ROBAXIN) 500 MG tablet Take 1 tablet (500 mg total) by mouth 4 (four) times daily. 04/09/17   Iolanda Folson, Delorise Royals, PA-C  metoCLOPramide (REGLAN) 10 MG tablet Take 1 tablet (10 mg total) by mouth every 6 (six) hours as needed. Patient not taking: Reported on 10/04/2016 07/03/16   Sharman Cheek, MD  Multiple Vitamins-Minerals (MULTIVITAMIN WITH MINERALS) tablet Take 1 tablet by mouth daily.    [provider]  omeprazole (PRILOSEC OTC) 20 MG tablet Take 1 tablet (20 mg total) by mouth daily. Patient not taking: Reported on 10/04/2016 06/30/16 06/30/17  Loleta Rose, MD  ondansetron (ZOFRAN ODT) 4 MG disintegrating tablet Allow 1-2 tablets to dissolve in your mouth every 8 hours as needed for nausea/vomiting Patient not  taking: Reported on 10/04/2016 06/30/16   Loleta RoseForbach, Cory, MD  penicillin v potassium (VEETID) 500 MG tablet Take 1 tablet (500 mg total) by mouth 3 (three) times daily. 01/24/17   Chinita Pesterriplett, Cari B, FNP  predniSONE (DELTASONE) 50 MG tablet Take 1 tab PO daily 10/04/16   Schaevitz, Myra Rudeavid Matthew, MD  sucralfate (CARAFATE) 1 g tablet Take 1 tablet (1 g total) by mouth 4 (four) times daily as needed (for abdominal discomfort, nausea, and/or vomiting). Patient not  taking: Reported on 10/04/2016 06/30/16   Loleta RoseForbach, Cory, MD    Allergies Nsaids  Family History  Problem Relation Age of Onset  . Alzheimer's disease Mother   . AAA (abdominal aortic aneurysm) Father   . Breast cancer Sister [id number]    Social History Social History  Substance Use Topics  . Smoking status: Current Some Day Smoker    Packs/day: 0.50    Types: Cigarettes    Last attempt to quit: 08/27/2014  . Smokeless tobacco: Never Used  . Alcohol use No     Review of Systems  Constitutional: No fever/chills Eyes: No visual changes.  Cardiovascular: no chest pain. Respiratory: no cough. No SOB. Gastrointestinal: No abdominal pain.  No nausea, no vomiting.  Musculoskeletal: positive for pain radiating from left shoulder to left hand Skin: Negative for rash, abrasions, lacerations, ecchymosis. Neurological: Negative for headaches, focal weakness or numbness. 10-point ROS otherwise negative.  ____________________________________________   PHYSICAL EXAM:  VITAL SIGNS: ED Triage Vitals  Enc Vitals Group     BP 04/09/17 1832 119/70     Pulse Rate 04/09/17 1832 69     Resp 04/09/17 1832 18     Temp 04/09/17 1832 98.7 F (37.1 C)     Temp Source 04/09/17 1832 Oral     SpO2 04/09/17 1832 98 %     Weight 04/09/17 1832 130 lb (59 kg)     Height 04/09/17 1832 5\' 2"  (1.575 m)     Head Circumference --      Peak Flow --      Pain Score 04/09/17 1831 9     Pain Loc --      Pain Edu? --      Excl. in GC? --      Constitutional: Alert and oriented. Well appearing and in no acute distress. Eyes: Conjunctivae are normal. PERRL. EOMI. Head: Atraumatic. Neck: No stridor.  No cervical spine tenderness to palpation.  Cardiovascular: Normal rate, regular rhythm. Normal S1 and S2.  Good peripheral circulation. Respiratory: Normal respiratory effort without tachypnea or retractions. Lungs CTAB. Good air entry to the bases with no decreased or absent breath sounds. Musculoskeletal:  Full range of motion to all extremities. No gross deformities appreciated.no gross deformity or or edema noted to the left shoulder. Full range of motion to the left shoulder. Patient is tender to palpation over the frontal auscultation over the nerve bundle. No other tenderness to palpation. No palpable abnormality. Equal grip strength bilateral upper extremity's. Radial pulse intact dist Sensation intact and equal bilateral upper extremities. Neurologic:  Normal speech and language. No gross focal neurologic deficits are appreciated.  Skin:  Skin is warm, dry and intact. No rash noted. Psychiatric: Mood and affect are normal. Speech and behavior are normal. Patient exhibits appropriate insight and judgement.   ____________________________________________   LABS (all labs ordered are listed, but only abnormal results are displayed)  Labs Reviewed - No data to display ____________________________________________  EKG  ED ECG REPORT I, Delorise RoyalsJonathan D Kenedee Molesky,  personally viewed and interpreted this ECG.   Date: 04/08/2017  EKG Time: 1833  Rate: 64  Rhythm: normal EKG, normal sinus rhythm  Axis: normal  Intervals:none and normal PR, QRS, QT interval  ST&T Change: no ST elevations or depressions noted. No acute T-wave changes.  Normal EKG  ____________________________________________  RADIOLOGY   No results found.  ____________________________________________    PROCEDURES  Procedure(s) performed:    Procedures    Medications  ketorolac (TORADOL) 30 MG/ML injection 30 mg (not administered)  orphenadrine (NORFLEX) injection 60 mg (not administered)     ____________________________________________   INITIAL IMPRESSION / ASSESSMENT AND PLAN / ED COURSE  Pertinent labs & imaging results that were available during my care of the patient were reviewed by me and considered in my medical decision making (see chart for details).  Review of the [place] CSRS was performed in  accordance of the [facility] prior to dispensing any controlled drugs.     Patient's diagnosis is consistent with cervical radiculopathy for muscle strain to the neck region.no indication for labs or imaging at this time. Patient is given Toradol and muscle relaxer injection of emergency department.. Patient will be discharged home with prescriptions for anti-inflammatory muscle relaxer. Patient has "gas" with NSAIDs, but will be able to take NSAIDs at this time.. Patient is to follow up with primary care as needed or otherwise directed. Patient is given ED precautions to return to the ED for any worsening or new symptoms.     ____________________________________________  FINAL CLINICAL IMPRESSION(S) / ED DIAGNOSES  Final diagnoses:  Strain of neck muscle, initial encounter  Cervical radiculopathy      NEW MEDICATIONS STARTED DURING THIS VISIT:  New Prescriptions   MELOXICAM (MOBIC) 15 MG TABLET    Take 1 tablet (15 mg total) by mouth daily.   METHOCARBAMOL (ROBAXIN) 500 MG TABLET    Take 1 tablet (500 mg total) by mouth 4 (four) times daily.        This chart was dictated using voice recognition software/Dragon. Despite best efforts to proofread, errors can occur which can change the meaning. Any change was purely unintentional.    Racheal Patches, PA-C 04/09/17 1958    Aubre Quincy, Delorise Royals, PA-C 04/09/17 2002    Emily Filbert, MD 04/09/17 [phone]

## 2017-04-14 ENCOUNTER — Emergency Department
Admission: EM | Admit: 2017-04-14 | Discharge: 2017-04-14 | Discharge status: Against Medical Advice | Payer: Self-pay | Attending: Emergency Medicine | Admitting: Emergency Medicine

## 2017-04-14 ENCOUNTER — Encounter: Payer: Self-pay | Admitting: *Deleted

## 2017-04-14 DIAGNOSIS — T887XXA Unspecified adverse effect of drug or medicament, initial encounter: Secondary | ICD-10-CM | POA: Insufficient documentation

## 2017-04-14 DIAGNOSIS — Y69 Unspecified misadventure during surgical and medical care: Secondary | ICD-10-CM | POA: Insufficient documentation

## 2017-04-14 DIAGNOSIS — Z79899 Other long term (current) drug therapy: Secondary | ICD-10-CM | POA: Insufficient documentation

## 2017-04-14 DIAGNOSIS — K2960 Other gastritis without bleeding: Secondary | ICD-10-CM | POA: Insufficient documentation

## 2017-04-14 DIAGNOSIS — T39395A Adverse effect of other nonsteroidal anti-inflammatory drugs [NSAID], initial encounter: Secondary | ICD-10-CM | POA: Insufficient documentation

## 2017-04-14 DIAGNOSIS — F17210 Nicotine dependence, cigarettes, uncomplicated: Secondary | ICD-10-CM | POA: Insufficient documentation

## 2017-04-14 MED ORDER — SUCRALFATE 1 GM/10ML PO SUSP
1.0000 g | Freq: Once | ORAL | Status: AC
  Administered 2017-04-14: 1 g via ORAL
  Filled 2017-04-14: qty 10

## 2017-04-14 NOTE — ED Notes (Signed)
Patient left prior to being discharged. PA aware.

## 2017-04-14 NOTE — ED Notes (Signed)
Pt on  meloxicam and robaxin for left shoulder pain. Is now having abd pain, cramping and diarrhea and is concerned that she is having an allergic reaction. States that she only took 4 days of meds and threw rest away.

## 2017-04-14 NOTE — ED Triage Notes (Signed)
States she was given methcarbam and meloxicam on 8/16 for her shoulder pain and states an upset stomach ever since she began taking the meds, also states continued shoulder pain

## 2017-04-14 NOTE — ED Provider Notes (Signed)
[facility] Emergency Department Provider Note  ____________________________________________   First MD Initiated Contact with Patient 04/14/17 1032     (approximate)  I have reviewed the triage vital signs and the nursing notes.   HISTORY  Chief Complaint Medication Reaction   HPI Nicole Chandler is a 56 y.o. female is here with complaint of GI upset after being placed on methocarbamol and meloxicam on 8/16 for her shoulder pain. Patient states that these medications did not help her shoulder pain. She also complains that the medication is the cause of her diarrhea today and this morning she is already had 5 diarrhea stools. She denies any fever or chills. She denies any tarry black stools. Patient rates her pain as an 8 out of 10.   Past Medical History:  Diagnosis Date  . Bipolar 1 disorder (HCC)   . Cancer (HCC)   . Chronic back pain unk  . Depression   . Panic attacks     Patient Active Problem List   Diagnosis Date Noted  . Seizure (HCC) 03/24/2016  . Substance induced mood disorder (HCC) 03/05/2016  . Opioid use disorder, severe, dependence (HCC) 02/25/2016  . Cocaine use disorder, moderate, dependence (HCC) 02/25/2016  . Tobacco use disorder 02/25/2016  . Opioid-induced mood disorder (HCC) 02/25/2016  . Cannabis use disorder, moderate, dependence (HCC) 02/25/2016    Past Surgical History:  Procedure Laterality Date  . ABDOMINAL SURGERY    . EYE SURGERY      Prior to Admission medications   Medication Sig Start Date End Date Taking? Authorizing Provider  albuterol (PROVENTIL HFA;VENTOLIN HFA) 108 (90 Base) MCG/ACT inhaler Inhale 2 puffs into the lungs every 6 (six) hours as needed. 10/04/16   Myrna Blazer, MD  diclofenac (VOLTAREN) 75 MG EC tablet Take 1 tablet (75 mg total) by mouth 2 (two) times daily. Patient not taking: Reported on 10/04/2016 06/28/16   Evangeline Dakin, PA-C  FLUoxetine (PROZAC) 20 MG capsule Take 20 mg  by mouth daily.    [provider]  gabapentin (NEURONTIN) 300 MG capsule Take 300 mg by mouth 2 (two) times daily.    [provider]  levETIRAcetam (KEPPRA) 500 MG tablet Take 500 mg by mouth 2 (two) times daily.    [provider]  meloxicam (MOBIC) 15 MG tablet Take 1 tablet (15 mg total) by mouth daily. 04/09/17   Cuthriell, Delorise Royals, PA-C  methocarbamol (ROBAXIN) 500 MG tablet Take 1 tablet (500 mg total) by mouth 4 (four) times daily. 04/09/17   Cuthriell, Delorise Royals, PA-C  metoCLOPramide (REGLAN) 10 MG tablet Take 1 tablet (10 mg total) by mouth every 6 (six) hours as needed. Patient not taking: Reported on 10/04/2016 07/03/16   Sharman Cheek, MD  Multiple Vitamins-Minerals (MULTIVITAMIN WITH MINERALS) tablet Take 1 tablet by mouth daily.    [provider]  omeprazole (PRILOSEC OTC) 20 MG tablet Take 1 tablet (20 mg total) by mouth daily. Patient not taking: Reported on 10/04/2016 06/30/16 06/30/17  Loleta Rose, MD  ondansetron (ZOFRAN ODT) 4 MG disintegrating tablet Allow 1-2 tablets to dissolve in your mouth every 8 hours as needed for nausea/vomiting Patient not taking: Reported on 10/04/2016 06/30/16   Loleta Rose, MD  sucralfate (CARAFATE) 1 g tablet Take 1 tablet (1 g total) by mouth 4 (four) times daily as needed (for abdominal discomfort, nausea, and/or vomiting). Patient not taking: Reported on 10/04/2016 06/30/16   Loleta Rose, MD    Allergies Nsaids  Family History  Problem Relation Age of Onset  . Alzheimer's disease Mother   . AAA (abdominal aortic aneurysm) Father   . Breast cancer Sister 59    Social History Social History  Substance Use Topics  . Smoking status: Current Some Day Smoker    Packs/day: 0.50    Types: Cigarettes    Last attempt to quit: 08/27/2014  . Smokeless tobacco: Never Used  . Alcohol use No    Review of Systems Constitutional: No fever/chills Cardiovascular: Denies chest pain. Respiratory: Denies  shortness of breath. Gastrointestinal: Positive cramping.  No nausea, no vomiting.  Positive diarrhea.   Genitourinary: Negative for dysuria. Musculoskeletal: Positive left shoulder pain. Skin: Negative for rash. Neurological: Negative for  focal weakness or numbness. ____________________________________________   PHYSICAL EXAM:  VITAL SIGNS: ED Triage Vitals [04/14/17 1026]  Enc Vitals Group     BP (!) 162/94     Pulse Rate 67     Resp 16     Temp 98.8 F (37.1 C)     Temp Source Oral     SpO2 96 %     Weight 130 lb (59 kg)     Height 5\' 2"  (1.575 m)     Head Circumference      Peak Flow      Pain Score 8     Pain Loc      Pain Edu?      Excl. in GC?    Constitutional: Alert and oriented. Well appearing and in no acute distress. Eyes: Conjunctivae are normal.  Head: Atraumatic. Nose: No congestion/rhinnorhea. Neck: No stridor.   Cardiovascular: Normal rate, regular rhythm. Grossly normal heart sounds.  Good peripheral circulation. Respiratory: Normal respiratory effort.  No retractions. Lungs CTAB. Gastrointestinal: Soft and nontender. No distention. Bowel sounds are hypoactive 4 quadrants. Musculoskeletal: Moves both upper and lower extremities without any difficulty. Normal gait was noted. Neurologic:  Normal speech and language. No gross focal neurologic deficits are appreciated. No gait instability. Skin:  Skin is warm, dry and intact. No rash noted. Psychiatric: Mood and affect are normal. Speech and behavior are normal.  ____________________________________________   LABS (all labs ordered are listed, but only abnormal results are displayed)  Labs Reviewed - No data to display  PROCEDURES  Procedure(s) performed: None  Procedures  Critical Care performed: No  ____________________________________________   INITIAL IMPRESSION / ASSESSMENT AND PLAN / ED COURSE  Pertinent labs & imaging results that were available during my care of the patient were  reviewed by me and considered in my medical decision making (see chart for details).  Patient was given Carafate suspension 1 g by mouth while in the department. Patient was not seen going to  the restroom after being given this medication.  This provider went to check on the patient to see if she was getting any relief from the medication and patient had eloped from the room prior to discharge.   ____________________________________________   FINAL CLINICAL IMPRESSION(S) / ED DIAGNOSES  Final diagnoses:  Gastritis due to nonsteroidal anti-inflammatory drug      NEW MEDICATIONS STARTED DURING THIS VISIT:  Discharge Medication List as of 04/14/2017 12:39 PM       Note:  This document was prepared using Dragon voice recognition software and may include unintentional dictation errors.    Tommi Rumps, PA-C 04/14/17 1628    Merrily Brittle, MD 04/15/17 [phone]

## 2017-04-24 ENCOUNTER — Encounter: Payer: Self-pay | Admitting: Emergency Medicine

## 2017-04-24 DIAGNOSIS — F17210 Nicotine dependence, cigarettes, uncomplicated: Secondary | ICD-10-CM | POA: Insufficient documentation

## 2017-04-24 DIAGNOSIS — Z79899 Other long term (current) drug therapy: Secondary | ICD-10-CM | POA: Insufficient documentation

## 2017-04-24 DIAGNOSIS — Y929 Unspecified place or not applicable: Secondary | ICD-10-CM | POA: Insufficient documentation

## 2017-04-24 DIAGNOSIS — Y9383 Activity, rough housing and horseplay: Secondary | ICD-10-CM | POA: Insufficient documentation

## 2017-04-24 DIAGNOSIS — T148XXA Other injury of unspecified body region, initial encounter: Secondary | ICD-10-CM | POA: Insufficient documentation

## 2017-04-24 DIAGNOSIS — X509XXA Other and unspecified overexertion or strenuous movements or postures, initial encounter: Secondary | ICD-10-CM | POA: Insufficient documentation

## 2017-04-24 DIAGNOSIS — Y999 Unspecified external cause status: Secondary | ICD-10-CM | POA: Insufficient documentation

## 2017-04-24 NOTE — ED Triage Notes (Signed)
Pt c/o right upper thigh pain after playing with grandson today. Pt is able to bare weight on area and no swelling noted.

## 2017-04-25 ENCOUNTER — Emergency Department
Admission: EM | Admit: 2017-04-25 | Discharge: 2017-04-25 | Disposition: A | Discharge status: Home / Self Care | Payer: Self-pay | Attending: Emergency Medicine | Admitting: Emergency Medicine

## 2017-04-25 DIAGNOSIS — T148XXA Other injury of unspecified body region, initial encounter: Secondary | ICD-10-CM

## 2017-04-25 DIAGNOSIS — M79604 Pain in right leg: Secondary | ICD-10-CM

## 2017-04-25 MED ORDER — CYCLOBENZAPRINE HCL 5 MG PO TABS: ORAL_TABLET | ORAL | 0 refills | Status: DC

## 2017-04-25 MED ORDER — DICLOFENAC SODIUM 1 % TD GEL: 2.0000 g | Freq: Four times a day (QID) | TRANSDERMAL | 0 refills | Status: DC

## 2017-04-25 MED ORDER — HYDROCODONE-ACETAMINOPHEN 5-325 MG PO TABS: 1.0000 | ORAL_TABLET | Freq: Four times a day (QID) | ORAL | 0 refills | Status: DC | PRN

## 2017-04-25 MED ORDER — CYCLOBENZAPRINE HCL 10 MG PO TABS
5.0000 mg | ORAL_TABLET | Freq: Once | ORAL | Status: AC
  Administered 2017-04-25: 5 mg via ORAL
  Filled 2017-04-25: qty 1

## 2017-04-25 MED ORDER — HYDROCODONE-ACETAMINOPHEN 5-325 MG PO TABS
1.0000 | ORAL_TABLET | Freq: Once | ORAL | Status: AC
  Administered 2017-04-25: 1 via ORAL
  Filled 2017-04-25: qty 1

## 2017-04-25 NOTE — Discharge Instructions (Signed)
1. Apply pain gel to affected area several times daily. 2. Return to the ER for worsening symptoms, persistent vomiting, difficulty breathing or other concerns.

## 2017-04-25 NOTE — ED Provider Notes (Signed)
[facility] Emergency Department Provider Note   ____________________________________________   First MD Initiated Contact with Patient 04/25/17 [phone]     (approximate)  I have reviewed the triage vital signs and the nursing notes.   HISTORY  Chief Complaint Leg Pain    HPI Nicole Chandler is a 56 y.o. female who presents to the ED from home with a chief complaint of right upper thigh pain. Patient reports playing indoors with her grandson several days ago, bouncing him on her thighs. Complains of right upper thigh pain with cramps since. She is able to bear weight on that leg. Denies associated swelling.Denies fever, chills, chest pain, shortness of breath, abdominal pain, nausea, vomiting. Denies recent travel or trauma. Nothing makes her symptoms better. Movement makes her symptoms worse.   Past Medical History:  Diagnosis Date  . Bipolar 1 disorder (HCC)   . Cancer (HCC)   . Chronic back pain unk  . Depression   . Panic attacks     Patient Active Problem List   Diagnosis Date Noted  . Seizure (HCC) 03/24/2016  . Substance induced mood disorder (HCC) 03/05/2016  . Opioid use disorder, severe, dependence (HCC) 02/25/2016  . Cocaine use disorder, moderate, dependence (HCC) 02/25/2016  . Tobacco use disorder 02/25/2016  . Opioid-induced mood disorder (HCC) 02/25/2016  . Cannabis use disorder, moderate, dependence (HCC) 02/25/2016    Past Surgical History:  Procedure Laterality Date  . ABDOMINAL SURGERY    . EYE SURGERY      Prior to Admission medications   Medication Sig Start Date End Date Taking? Authorizing Provider  albuterol (PROVENTIL HFA;VENTOLIN HFA) 108 (90 Base) MCG/ACT inhaler Inhale 2 puffs into the lungs every 6 (six) hours as needed. 10/04/16   Myrna Blazer, MD  cyclobenzaprine (FLEXERIL) 5 MG tablet 1 tablet every 8 hours as needed for muscle spasms 04/25/17   Irean Hong, MD  diclofenac (VOLTAREN) 75 MG EC tablet  Take 1 tablet (75 mg total) by mouth 2 (two) times daily. Patient not taking: Reported on 10/04/2016 06/28/16   Evangeline Dakin, PA-C  FLUoxetine (PROZAC) 20 MG capsule Take 20 mg by mouth daily.    [provider]  gabapentin (NEURONTIN) 300 MG capsule Take 300 mg by mouth 2 (two) times daily.    [provider]  HYDROcodone-acetaminophen (NORCO) 5-325 MG tablet Take 1 tablet by mouth every 6 (six) hours as needed for moderate pain. 04/25/17   Irean Hong, MD  levETIRAcetam (KEPPRA) 500 MG tablet Take 500 mg by mouth 2 (two) times daily.    [provider]  meloxicam (MOBIC) 15 MG tablet Take 1 tablet (15 mg total) by mouth daily. 04/09/17   Cuthriell, Delorise Royals, PA-C  methocarbamol (ROBAXIN) 500 MG tablet Take 1 tablet (500 mg total) by mouth 4 (four) times daily. 04/09/17   Cuthriell, Delorise Royals, PA-C  metoCLOPramide (REGLAN) 10 MG tablet Take 1 tablet (10 mg total) by mouth every 6 (six) hours as needed. Patient not taking: Reported on 10/04/2016 07/03/16   Sharman Cheek, MD  Multiple Vitamins-Minerals (MULTIVITAMIN WITH MINERALS) tablet Take 1 tablet by mouth daily.    [provider]  omeprazole (PRILOSEC OTC) 20 MG tablet Take 1 tablet (20 mg total) by mouth daily. Patient not taking: Reported on 10/04/2016 06/30/16 06/30/17  Loleta Rose, MD  ondansetron (ZOFRAN ODT) 4 MG disintegrating tablet Allow 1-2 tablets to dissolve in your mouth every 8 hours as needed for nausea/vomiting Patient not  taking: Reported on 10/04/2016 06/30/16   Loleta Rose, MD  sucralfate (CARAFATE) 1 g tablet Take 1 tablet (1 g total) by mouth 4 (four) times daily as needed (for abdominal discomfort, nausea, and/or vomiting). Patient not taking: Reported on 10/04/2016 06/30/16   Loleta Rose, MD    Allergies Nsaids  Family History  Problem Relation Age of Onset  . Alzheimer's disease Mother   . AAA (abdominal aortic aneurysm) Father   . Breast cancer Sister 109    Social  History Social History  Substance Use Topics  . Smoking status: Current Some Day Smoker    Packs/day: 0.50    Types: Cigarettes    Last attempt to quit: 08/27/2014  . Smokeless tobacco: Never Used  . Alcohol use No    Review of Systems  Constitutional: No fever/chills. Eyes: No visual changes. ENT: No sore throat. Cardiovascular: Denies chest pain. Respiratory: Denies shortness of breath. Gastrointestinal: No abdominal pain.  No nausea, no vomiting.  No diarrhea.  No constipation. Genitourinary: Negative for dysuria. Musculoskeletal: Positive for right thigh pain. Negative for back pain. Skin: Negative for rash. Neurological: Negative for headaches, focal weakness or numbness.   ____________________________________________   PHYSICAL EXAM:  VITAL SIGNS: ED Triage Vitals [04/24/17 2238]  Enc Vitals Group     BP 125/72     Pulse Rate 78     Resp 16     Temp 98 F (36.7 C)     Temp Source Oral     SpO2 97 %     Weight 130 lb (59 kg)     Height      Head Circumference      Peak Flow      Pain Score      Pain Loc      Pain Edu?      Excl. in GC?     Constitutional: Alert and oriented. Well appearing and in no acute distress. Eyes: Conjunctivae are normal. PERRL. EOMI. Head: Atraumatic. Nose: No congestion/rhinnorhea. Mouth/Throat: Mucous membranes are moist.  Oropharynx non-erythematous. Neck: No stridor.   Cardiovascular: Normal rate, regular rhythm. Grossly normal heart sounds.  Good peripheral circulation. Respiratory: Normal respiratory effort.  No retractions. Lungs CTAB. Gastrointestinal: Soft and nontender. No distention. No abdominal bruits. No CVA tenderness. Musculoskeletal: Mild right anterior thigh tenderness to palpation. No appreciate swelling or external evidence of injury. No pedal edema.  No joint effusions. Neurologic:  Normal speech and language. No gross focal neurologic deficits are appreciated. Ambulating with steady gait. Skin:  Skin is  warm, dry and intact. No rash noted. Psychiatric: Mood and affect are normal. Speech and behavior are normal.  ____________________________________________   LABS (all labs ordered are listed, but only abnormal results are displayed)  Labs Reviewed - No data to display ____________________________________________  EKG  none ____________________________________________  RADIOLOGY  No results found.  ____________________________________________   PROCEDURES  Procedure(s) performed: None  Procedures  Critical Care performed: No  ____________________________________________   INITIAL IMPRESSION / ASSESSMENT AND PLAN / ED COURSE  Pertinent labs & imaging results that were available during my care of the patient were reviewed by me and considered in my medical decision making (see chart for details).  56 year old female who presents with a several day history of right anterior thigh pain with spasms. Will administer 1 Norco with Flexeril now provided patient has a ride.  Per chart review, patient appears to have history of opiate use disorder.  I reviewed the patient's prescription history over the last  12 months in the multi-state controlled substances database(s) that includes [place], [place], [place], [place], [place], [place], [place], [place], and [place].  Results were notable for #6 hydrocodone dispensed 04/22/2017.  Explained to patient there will be no prescriptions for opiates or muscle relaxers provided. She will need to obtain these from her PCP. Will prescribe Voltaren gel. She had GI upset from Meloxicam which was recently prescribed. Voltaren gel should not cause these side effects as it will be applied transdermal. Strict return precautions given. Patient verbalizes understanding and agrees with plan of care.      ____________________________________________   FINAL  CLINICAL IMPRESSION(S) / ED DIAGNOSES  Final diagnoses:  Muscle strain  Right leg pain      NEW MEDICATIONS STARTED DURING THIS VISIT:  New Prescriptions   CYCLOBENZAPRINE (FLEXERIL) 5 MG TABLET    1 tablet every 8 hours as needed for muscle spasms   HYDROCODONE-ACETAMINOPHEN (NORCO) 5-325 MG TABLET    Take 1 tablet by mouth every 6 (six) hours as needed for moderate pain.     Note:  This document was prepared using Dragon voice recognition software and may include unintentional dictation errors.    Irean HongSung, Sibel Khurana J, MD 04/25/17 [phone]

## 2017-07-08 ENCOUNTER — Encounter: Payer: Self-pay | Admitting: Intensive Care

## 2017-07-08 ENCOUNTER — Emergency Department
Admission: EM | Admit: 2017-07-08 | Discharge: 2017-07-08 | Disposition: A | Discharge status: Home / Self Care | Payer: Self-pay | Attending: Emergency Medicine | Admitting: Emergency Medicine

## 2017-07-08 DIAGNOSIS — Z791 Long term (current) use of non-steroidal anti-inflammatories (NSAID): Secondary | ICD-10-CM | POA: Insufficient documentation

## 2017-07-08 DIAGNOSIS — F1120 Opioid dependence, uncomplicated: Secondary | ICD-10-CM | POA: Diagnosis present

## 2017-07-08 DIAGNOSIS — G8929 Other chronic pain: Secondary | ICD-10-CM | POA: Insufficient documentation

## 2017-07-08 DIAGNOSIS — Z79899 Other long term (current) drug therapy: Secondary | ICD-10-CM | POA: Insufficient documentation

## 2017-07-08 DIAGNOSIS — Z87898 Personal history of other specified conditions: Secondary | ICD-10-CM

## 2017-07-08 DIAGNOSIS — F17210 Nicotine dependence, cigarettes, uncomplicated: Secondary | ICD-10-CM | POA: Insufficient documentation

## 2017-07-08 DIAGNOSIS — F4325 Adjustment disorder with mixed disturbance of emotions and conduct: Secondary | ICD-10-CM | POA: Insufficient documentation

## 2017-07-08 HISTORY — DX: Unspecified osteoarthritis, unspecified site: M19.90

## 2017-07-08 LAB — COMPREHENSIVE METABOLIC PANEL
ALBUMIN: 4 g/dL (ref 3.5–5.0)
ALT: 6 U/L — ABNORMAL LOW (ref 14–54)
ANION GAP: 15 (ref 5–15)
AST: 23 U/L (ref 15–41)
Alkaline Phosphatase: 102 U/L (ref 38–126)
BUN: 6 mg/dL (ref 6–20)
CO2: 25 mmol/L (ref 22–32)
Calcium: 9 mg/dL (ref 8.9–10.3)
Chloride: 101 mmol/L (ref 101–111)
Creatinine, Ser: 0.96 mg/dL (ref 0.44–1.00)
GFR calc non Af Amer: >60 mL/min (ref >60)
GLUCOSE: 92 mg/dL (ref 65–99)
Potassium: 3.5 mmol/L (ref 3.5–5.1)
Sodium: 141 mmol/L (ref 135–145)
TOTAL PROTEIN: 7.3 g/dL (ref 6.5–8.1)

## 2017-07-08 LAB — CBC
HCT: 38.8 % (ref 35.0–47.0)
Hemoglobin: 13.2 g/dL (ref 12.0–16.0)
MCH: 31.6 pg (ref 26.0–34.0)
MCHC: 34.1 g/dL (ref 32.0–36.0)
MCV: 92.6 fL (ref 80.0–100.0)
PLATELETS: 357 10*3/uL (ref 150–440)
RBC: 4.19 MIL/uL (ref 3.80–5.20)
RDW: 14.8 % — ABNORMAL HIGH (ref 11.5–14.5)
WBC: 9.4 10*3/uL (ref 3.6–11.0)

## 2017-07-08 LAB — URINE DRUG SCREEN, QUALITATIVE (ARMC ONLY)
Amphetamines, Ur Screen: NOT DETECTED
BARBITURATES, UR SCREEN: NOT DETECTED
BENZODIAZEPINE, UR SCRN: POSITIVE — AB
CANNABINOID 50 NG, UR ~~LOC~~: POSITIVE — AB
Cocaine Metabolite,Ur ~~LOC~~: NOT DETECTED
MDMA (ECSTASY) UR SCREEN: NOT DETECTED
Methadone Scn, Ur: NOT DETECTED
Opiate, Ur Screen: NOT DETECTED
Phencyclidine (PCP) Ur S: NOT DETECTED
TRICYCLIC, UR SCREEN: NOT DETECTED

## 2017-07-08 LAB — ETHANOL: Alcohol, Ethyl (B): <10 mg/dL (ref <10)

## 2017-07-08 MED ORDER — MIRTAZAPINE 15 MG PO TABS: 15.0000 mg | ORAL_TABLET | Freq: Every day | ORAL | 0 refills | Status: DC

## 2017-07-08 NOTE — ED Notes (Signed)
Per [place], pt to be d/c

## 2017-07-08 NOTE — ED Notes (Signed)
Pt to quad restroom at this time

## 2017-07-08 NOTE — ED Triage Notes (Signed)
Patient states "I felt really bad this morning because my brother in law just died. It is hard to handle and I said infront of people I could take a knife and stab myself in the heart but I didn't mean it" Patient brought  In by IVC papers. Denies HI/SI at this time. Patient is tearful in triage.

## 2017-07-08 NOTE — ED Notes (Signed)
Awaiting IVC reversal. 

## 2017-07-08 NOTE — ED Notes (Signed)
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
## 2017-07-08 NOTE — Discharge Instructions (Signed)
Please seek medical attention and help for any thoughts about wanting to harm yourself, harm others, any concerning change in behavior, severe depression, inappropriate drug use or any other new or concerning symptoms. ° °

## 2017-07-08 NOTE — ED Notes (Signed)
Pt. Verbalizes understanding of d/c instructions, medications, and follow-up. VS stable and pt denies pain.  Pt. In NAD at time of d/c and denies further concerns regarding this visit. Pt. Stable at the time of departure from the unit, departing unit by the safest and most appropriate manner per that pt condition and limitations with all belongings accounted for. Pt advised to return to the ED at any time for emergent concerns, or for new/worsening symptoms.

## 2017-07-08 NOTE — ED Provider Notes (Signed)
[facility] Emergency Department Provider Note   ____________________________________________   I have reviewed the triage vital signs and the nursing notes.   HISTORY  Chief Complaint Suicidal Ideation  History limited by: Not Limited   HPI Nicole Chandler is a 56 y.o. female who presents to the emergency department today under IVC because of concern for suicidal ideation.  DURATION:today CONTEXT: patient recently lost a family member. States that she was upset earlier, however denies any intention of self harm. MODIFYING FACTORS: none identified ASSOCIATED SYMPTOMS: denies any medical complaints except for chronic diarrhea.  Per medical record review patient has a history of bipolar  Past Medical History:  Diagnosis Date  . Arthritis   . Bipolar 1 disorder (HCC)   . Cancer (HCC)   . Chronic back pain unk  . Depression   . Panic attacks     Patient Active Problem List   Diagnosis Date Noted  . Seizure (HCC) 03/24/2016  . Substance induced mood disorder (HCC) 03/05/2016  . Opioid use disorder, severe, dependence (HCC) 02/25/2016  . Cocaine use disorder, moderate, dependence (HCC) 02/25/2016  . Tobacco use disorder 02/25/2016  . Opioid-induced mood disorder (HCC) 02/25/2016  . Cannabis use disorder, moderate, dependence (HCC) 02/25/2016    Past Surgical History:  Procedure Laterality Date  . ABDOMINAL SURGERY    . EYE SURGERY      Prior to Admission medications   Medication Sig Start Date End Date Taking? Authorizing Provider  albuterol (PROVENTIL HFA;VENTOLIN HFA) 108 (90 Base) MCG/ACT inhaler Inhale 2 puffs into the lungs every 6 (six) hours as needed. 10/04/16   Myrna Blazer, MD  diclofenac (VOLTAREN) 75 MG EC tablet Take 1 tablet (75 mg total) by mouth 2 (two) times daily. Patient not taking: Reported on 10/04/2016 06/28/16   Evangeline Dakin, PA-C  diclofenac sodium (VOLTAREN) 1 % GEL Apply 2 g topically 4 (four) times  daily. 04/25/17   Irean Hong, MD  FLUoxetine (PROZAC) 20 MG capsule Take 20 mg by mouth daily.    [provider]  gabapentin (NEURONTIN) 300 MG capsule Take 300 mg by mouth 2 (two) times daily.    [provider]  levETIRAcetam (KEPPRA) 500 MG tablet Take 500 mg by mouth 2 (two) times daily.    [provider]  meloxicam (MOBIC) 15 MG tablet Take 1 tablet (15 mg total) by mouth daily. 04/09/17   Cuthriell, Delorise Royals, PA-C  methocarbamol (ROBAXIN) 500 MG tablet Take 1 tablet (500 mg total) by mouth 4 (four) times daily. 04/09/17   Cuthriell, Delorise Royals, PA-C  metoCLOPramide (REGLAN) 10 MG tablet Take 1 tablet (10 mg total) by mouth every 6 (six) hours as needed. Patient not taking: Reported on 10/04/2016 07/03/16   Sharman Cheek, MD  mirtazapine (REMERON) 15 MG tablet Take 1 tablet (15 mg total) at bedtime by mouth. 07/08/17 08/07/17  Clapacs, Jackquline Denmark, MD  Multiple Vitamins-Minerals (MULTIVITAMIN WITH MINERALS) tablet Take 1 tablet by mouth daily.    [provider]  omeprazole (PRILOSEC OTC) 20 MG tablet Take 1 tablet (20 mg total) by mouth daily. Patient not taking: Reported on 10/04/2016 06/30/16 06/30/17  Loleta Rose, MD  ondansetron (ZOFRAN ODT) 4 MG disintegrating tablet Allow 1-2 tablets to dissolve in your mouth every 8 hours as needed for nausea/vomiting Patient not taking: Reported on 10/04/2016 06/30/16   Loleta Rose, MD  sucralfate (CARAFATE) 1 g tablet Take 1 tablet (1 g total) by mouth 4 (four) times daily  as needed (for abdominal discomfort, nausea, and/or vomiting). Patient not taking: Reported on 10/04/2016 06/30/16   Loleta RoseForbach, Cory, MD    Allergies Nsaids  Family History  Problem Relation Age of Onset  . Alzheimer's disease Mother   . AAA (abdominal aortic aneurysm) Father   . Breast cancer Sister [id number]    Social History Social History   Tobacco Use  . Smoking status: Current Some Day Smoker    Packs/day: 0.50    Types: Cigarettes     Last attempt to quit: 08/27/2014    Years since quitting: 2.8  . Smokeless tobacco: Never Used  Substance Use Topics  . Alcohol use: No  . Drug use: Yes    Types: Marijuana, Cocaine    Comment: cocaine last night, marijuana last night    Review of Systems Constitutional: No fever/chills Eyes: No visual changes. ENT: No sore throat. Cardiovascular: Denies chest pain. Respiratory: Denies shortness of breath. Gastrointestinal: No abdominal pain.  No nausea, no vomiting.  Positive for chronic diarrhea. Genitourinary: Negative for dysuria. Musculoskeletal: Negative for back pain. Skin: Negative for rash. Neurological: Negative for headaches, focal weakness or numbness.  ____________________________________________   PHYSICAL EXAM:  VITAL SIGNS: ED Triage Vitals  Enc Vitals Group     BP --      Pulse --      Resp --      Temp --      Temp src --      SpO2 --      Weight 07/08/17 1800 141 lb (64 kg)     Height 07/08/17 1800 5\' 2"  (1.575 m)     Head Circumference --      Peak Flow --      Pain Score 07/08/17 1759 7   Constitutional: Alert and oriented. Well appearing and in no distress. Eyes: Conjunctivae are normal.  ENT   Head: Normocephalic and atraumatic.   Nose: No congestion/rhinnorhea.   Mouth/Throat: Mucous membranes are moist.   Neck: No stridor. Hematological/Lymphatic/Immunilogical: No cervical lymphadenopathy. Cardiovascular: Normal rate, regular rhythm.  No murmurs, rubs, or gallops.  Respiratory: Normal respiratory effort without tachypnea nor retractions. Breath sounds are clear and equal bilaterally. No wheezes/rales/rhonchi. Gastrointestinal: Soft and non tender. No rebound. No guarding.  Genitourinary: Deferred Musculoskeletal: Normal range of motion in all extremities. No lower extremity edema. Neurologic:  Normal speech and language. No gross focal neurologic deficits are appreciated.  Skin:  Skin is warm, dry and intact. No rash  noted. Psychiatric: Mood and affect are normal. Speech and behavior are normal. Patient exhibits appropriate insight and judgment.  ____________________________________________    LABS (pertinent positives/negatives)  UDS positive for cannabinoid, benzodiazepam Ethanol <10 CBC wnl except rdw CMP wnl except alt, tot bili  ____________________________________________   EKG  None  ____________________________________________    RADIOLOGY  None  ____________________________________________   PROCEDURES  Procedures  ____________________________________________   INITIAL IMPRESSION / ASSESSMENT AND PLAN / ED COURSE  Pertinent labs & imaging results that were available during my care of the patient were reviewed by me and considered in my medical decision making (see chart for details).  Patient presents under IVC. Patient was evaluated by Dr. Toni Amendlapacs. At this time he does not think she is a danger to herself. He did rescind the IVC. On my exam the patient denies any SI, she is calm. Will discharge. Will give [facility] information.   ____________________________________________   FINAL CLINICAL IMPRESSION(S) / ED DIAGNOSES  Final diagnoses:  History of substance use  Note: This dictation was prepared with Dragon dictation. Any transcriptional errors that result from this process are unintentional     Phineas SemenGoodman, Karo Rog, MD 07/08/17 2008

## 2017-07-08 NOTE — ED Notes (Addendum)
Pt walked to ride waiting outside by this RN, pt safely in vehicle

## 2017-07-10 ENCOUNTER — Encounter: Payer: Self-pay | Admitting: Emergency Medicine

## 2017-07-10 ENCOUNTER — Emergency Department
Admission: EM | Admit: 2017-07-10 | Discharge: 2017-07-10 | Disposition: A | Discharge status: Home / Self Care | Payer: Self-pay | Attending: Emergency Medicine | Admitting: Emergency Medicine

## 2017-07-10 DIAGNOSIS — F17210 Nicotine dependence, cigarettes, uncomplicated: Secondary | ICD-10-CM | POA: Insufficient documentation

## 2017-07-10 DIAGNOSIS — Z859 Personal history of malignant neoplasm, unspecified: Secondary | ICD-10-CM | POA: Insufficient documentation

## 2017-07-10 DIAGNOSIS — R569 Unspecified convulsions: Secondary | ICD-10-CM | POA: Insufficient documentation

## 2017-07-10 LAB — URINE DRUG SCREEN, QUALITATIVE (ARMC ONLY)
Amphetamines, Ur Screen: NOT DETECTED
Barbiturates, Ur Screen: NOT DETECTED
Benzodiazepine, Ur Scrn: POSITIVE — AB
CANNABINOID 50 NG, UR ~~LOC~~: POSITIVE — AB
Cocaine Metabolite,Ur ~~LOC~~: NOT DETECTED
MDMA (ECSTASY) UR SCREEN: NOT DETECTED
Methadone Scn, Ur: NOT DETECTED
Opiate, Ur Screen: NOT DETECTED
PHENCYCLIDINE (PCP) UR S: NOT DETECTED
Tricyclic, Ur Screen: NOT DETECTED

## 2017-07-10 LAB — CBC
HCT: 40.1 % (ref 35.0–47.0)
HEMOGLOBIN: 13.3 g/dL (ref 12.0–16.0)
MCH: 30.1 pg (ref 26.0–34.0)
MCHC: 33.2 g/dL (ref 32.0–36.0)
MCV: 90.7 fL (ref 80.0–100.0)
PLATELETS: 390 10*3/uL (ref 150–440)
RBC: 4.42 MIL/uL (ref 3.80–5.20)
RDW: 13.7 % (ref 11.5–14.5)
WBC: 7.2 10*3/uL (ref 3.6–11.0)

## 2017-07-10 LAB — BASIC METABOLIC PANEL
ANION GAP: 9 (ref 5–15)
BUN: 7 mg/dL (ref 6–20)
CHLORIDE: 103 mmol/L (ref 101–111)
CO2: 28 mmol/L (ref 22–32)
CREATININE: 0.87 mg/dL (ref 0.44–1.00)
Calcium: 9.3 mg/dL (ref 8.9–10.3)
GFR calc non Af Amer: >60 mL/min (ref >60)
Glucose, Bld: 93 mg/dL (ref 65–99)
Potassium: 4 mmol/L (ref 3.5–5.1)
SODIUM: 140 mmol/L (ref 135–145)

## 2017-07-10 MED ORDER — PHENYTOIN 50 MG PO CHEW
100.0000 mg | CHEWABLE_TABLET | Freq: Once | ORAL | Status: DC
Start: 2017-07-10 — End: 2017-07-10
  Filled 2017-07-10: qty 2

## 2017-07-10 MED ORDER — PHENYTOIN SODIUM EXTENDED 100 MG PO CAPS: 100.0000 mg | ORAL_CAPSULE | Freq: Three times a day (TID) | ORAL | 2 refills | Status: DC

## 2017-07-10 MED ORDER — PHENYTOIN 50 MG PO CHEW: 100.0000 mg | CHEWABLE_TABLET | Freq: Three times a day (TID) | ORAL | Status: DC

## 2017-07-10 MED ORDER — PHENYTOIN 50 MG PO CHEW
300.0000 mg | CHEWABLE_TABLET | Freq: Once | ORAL | Status: AC
  Administered 2017-07-10: 300 mg via ORAL
  Filled 2017-07-10: qty 6

## 2017-07-10 NOTE — ED Provider Notes (Signed)
[facility] Emergency Department Provider Note       Time seen: ----------------------------------------- 3:19 PM on 07/10/2017 -----------------------------------------    I have reviewed the triage vital signs and the nursing notes.  HISTORY   Chief Complaint Seizures    HPI Nicole Chandler is a 56 y.o. female with a history of depression, anxiety and substance abuse who presents to the ED for a seizure that occurred in front of the court house today.  Patient reports she 65 days clean from alcohol and drugs.  She used to take Keppra but has not had any and sometime.  She states she believes her stress is what caused the seizure as she is under a lot of stress right now.  She denies any complaints or pain from the event today.  Past Medical History:  Diagnosis Date  . Arthritis   . Bipolar 1 disorder (HCC)   . Cancer (HCC)   . Chronic back pain unk  . Depression   . Panic attacks     Patient Active Problem List   Diagnosis Date Noted  . 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 Seizure (HCC) 03/24/2016  . Substance induced mood disorder (HCC) 03/05/2016  . Opioid use disorder, severe, dependence (HCC) 02/25/2016  . Cocaine use disorder, moderate, dependence (HCC) 02/25/2016  . Tobacco use disorder 02/25/2016  . Opioid-induced mood disorder (HCC) 02/25/2016  . Cannabis use disorder, moderate, dependence (HCC) 02/25/2016    Past Surgical History:  Procedure Laterality Date  . ABDOMINAL SURGERY    . EYE SURGERY      Allergies Nsaids  Social History Social History   Tobacco Use  . Smoking status: Current Some Day Smoker    Packs/day: 0.50    Types: Cigarettes    Last attempt to quit: 08/27/2014    Years since quitting: 2.8  . Smokeless tobacco: Never Used  Substance Use Topics  . Alcohol use: No  . Drug use: Yes    Types: Marijuana, Cocaine    Comment: cocaine last night, marijuana last night     Review of Systems Constitutional: Negative for fever. Eyes: Negative for vision changes ENT:  Negative for congestion, sore throat Cardiovascular: Negative for chest pain. Respiratory: Negative for shortness of breath. Gastrointestinal: Negative for abdominal pain, vomiting and diarrhea. Genitourinary: Negative for dysuria. Musculoskeletal: Negative for back pain. Skin: Negative for rash. Neurological: Negative for headaches, focal weakness or numbness.  All systems negative/normal/unremarkable except as stated in the HPI  ____________________________________________   PHYSICAL EXAM:  VITAL SIGNS: ED Triage Vitals [07/10/17 1324]  Enc Vitals Group     BP 131/86     Pulse Rate 69     Resp 20     Temp 97.9 F (36.6 C)     Temp Source Oral     SpO2 96 %     Weight 141 lb (64 kg)     Height 5\' 2"  (1.575 m)     Head Circumference      Peak Flow      Pain Score 6     Pain Loc      Pain Edu?      Excl. in GC?     Constitutional: Alert and oriented. Well appearing and in no distress. Eyes: Conjunctivae are normal. Normal extraocular movements. ENT   Head: Normocephalic and atraumatic.   Nose: No congestion/rhinnorhea.   Mouth/Throat: Mucous membranes are moist.   Neck: No stridor. Cardiovascular: Normal rate, regular  rhythm. No murmurs, rubs, or gallops. Respiratory: Normal respiratory effort without tachypnea nor retractions. Breath sounds are clear and equal bilaterally. No wheezes/rales/rhonchi. Musculoskeletal: Nontender with normal range of motion in extremities. No lower extremity tenderness nor edema. Neurologic:  Normal speech and language. No gross focal neurologic deficits are appreciated.  Skin:  Skin is warm, dry and intact.  Abrasion noted to the chin which is not from today Psychiatric: Mood and affect are normal. Speech and behavior are normal.  ____________________________________________  ED COURSE:  Pertinent labs & imaging results  that were available during my care of the patient were reviewed by me and considered in my medical decision making (see chart for details). Patient presents for possible seizure event, we will assess with labs and imaging as indicated.   Procedures ____________________________________________   LABS (pertinent positives/negatives)  Labs Reviewed  BASIC METABOLIC PANEL  CBC  URINE DRUG SCREEN, QUALITATIVE ([facility] ONLY)  CBG MONITORING, ED   ____________________________________________  DIFFERENTIAL DIAGNOSIS   Seizure, pseudoseizure, syncope, dehydration, substance abuse  FINAL ASSESSMENT AND PLAN  Seizure   Plan: Patient had presented for possible seizure event today. Patient's labs are reassuring.  He is neurologically intact and I will restart her on Dilantin.  Otherwise she is stable for outpatient follow-up.   Emily FilbertWilliams, Pierre Dellarocco E, MD   Note: This note was generated in part or whole with voice recognition software. Voice recognition is usually quite accurate but there are transcription errors that can and very often do occur. I apologize for any typographical errors that were not detected and corrected.     Emily FilbertWilliams, Lindbergh Winkles E, MD 07/10/17 [phone]

## 2017-07-10 NOTE — ED Triage Notes (Signed)
Pt comes into the ED via [facility] c/o possible seizure in front of the court house.  Patient states she is 65 days clean from alcohol and drugs.  Patient states she has a h/o seizures but hasn't been taking her medication.  Patient states she believes her stress is what caused the seizure.  Patient in NAD at this time with even and unlabored respirations.  Patient is tearful in triage at this time. Patient ambulatory to triage, but was stumbling when walking without a 1 person assist.

## 2017-11-12 ENCOUNTER — Encounter: Payer: Self-pay | Admitting: Emergency Medicine

## 2017-11-12 ENCOUNTER — Emergency Department
Admission: EM | Admit: 2017-11-12 | Discharge: 2017-11-12 | Disposition: A | Discharge status: Home / Self Care | Payer: Self-pay | Attending: Emergency Medicine | Admitting: Emergency Medicine

## 2017-11-12 ENCOUNTER — Emergency Department: Payer: Self-pay

## 2017-11-12 ENCOUNTER — Other Ambulatory Visit: Payer: Self-pay

## 2017-11-12 ENCOUNTER — Emergency Department
Admission: EM | Admit: 2017-11-12 | Discharge: 2017-11-13 | Disposition: A | Discharge status: Home / Self Care | Payer: Self-pay | Attending: Emergency Medicine | Admitting: Emergency Medicine

## 2017-11-12 DIAGNOSIS — Z79899 Other long term (current) drug therapy: Secondary | ICD-10-CM | POA: Insufficient documentation

## 2017-11-12 DIAGNOSIS — M545 Low back pain: Secondary | ICD-10-CM | POA: Insufficient documentation

## 2017-11-12 DIAGNOSIS — F17210 Nicotine dependence, cigarettes, uncomplicated: Secondary | ICD-10-CM | POA: Insufficient documentation

## 2017-11-12 DIAGNOSIS — R569 Unspecified convulsions: Secondary | ICD-10-CM | POA: Insufficient documentation

## 2017-11-12 DIAGNOSIS — R110 Nausea: Secondary | ICD-10-CM | POA: Insufficient documentation

## 2017-11-12 DIAGNOSIS — F1120 Opioid dependence, uncomplicated: Secondary | ICD-10-CM | POA: Diagnosis present

## 2017-11-12 LAB — BASIC METABOLIC PANEL
Anion gap: 11 (ref 5–15)
BUN: <5 mg/dL — ABNORMAL LOW (ref 6–20)
CO2: 23 mmol/L (ref 22–32)
CREATININE: 0.94 mg/dL (ref 0.44–1.00)
Calcium: 9.2 mg/dL (ref 8.9–10.3)
Chloride: 104 mmol/L (ref 101–111)
GFR calc non Af Amer: >60 mL/min (ref >60)
Glucose, Bld: 135 mg/dL — ABNORMAL HIGH (ref 65–99)
POTASSIUM: 3.5 mmol/L (ref 3.5–5.1)
Sodium: 138 mmol/L (ref 135–145)

## 2017-11-12 LAB — CBC WITH DIFFERENTIAL/PLATELET
BASOS PCT: 0 %
Basophils Absolute: 0 10*3/uL (ref 0–0.1)
EOS ABS: 0 10*3/uL (ref 0–0.7)
Eosinophils Relative: 0 %
HCT: 38.5 % (ref 35.0–47.0)
HEMOGLOBIN: 12.7 g/dL (ref 12.0–16.0)
LYMPHS ABS: 0.8 10*3/uL — AB (ref 1.0–3.6)
Lymphocytes Relative: 6 %
MCH: 30.3 pg (ref 26.0–34.0)
MCHC: 33.1 g/dL (ref 32.0–36.0)
MCV: 91.7 fL (ref 80.0–100.0)
MONOS PCT: 3 %
Monocytes Absolute: 0.5 10*3/uL (ref 0.2–0.9)
Neutro Abs: 13.6 10*3/uL — ABNORMAL HIGH (ref 1.4–6.5)
Neutrophils Relative %: 91 %
Platelets: 448 10*3/uL — ABNORMAL HIGH (ref 150–440)
RBC: 4.19 MIL/uL (ref 3.80–5.20)
RDW: 12.9 % (ref 11.5–14.5)
WBC: 14.9 10*3/uL — ABNORMAL HIGH (ref 3.6–11.0)

## 2017-11-12 LAB — COMPREHENSIVE METABOLIC PANEL
ALBUMIN: 4 g/dL (ref 3.5–5.0)
ALK PHOS: 71 U/L (ref 38–126)
ALT: 7 U/L — AB (ref 14–54)
AST: 42 U/L — ABNORMAL HIGH (ref 15–41)
Anion gap: 18 — ABNORMAL HIGH (ref 5–15)
BUN: 6 mg/dL (ref 6–20)
CALCIUM: 9 mg/dL (ref 8.9–10.3)
CHLORIDE: 99 mmol/L — AB (ref 101–111)
CO2: 20 mmol/L — AB (ref 22–32)
CREATININE: 1.1 mg/dL — AB (ref 0.44–1.00)
GFR calc Af Amer: >60 mL/min (ref >60)
GFR calc non Af Amer: 55 mL/min — ABNORMAL LOW (ref >60)
Glucose, Bld: 173 mg/dL — ABNORMAL HIGH (ref 65–99)
Potassium: 3.4 mmol/L — ABNORMAL LOW (ref 3.5–5.1)
SODIUM: 137 mmol/L (ref 135–145)
Total Bilirubin: 0.4 mg/dL (ref 0.3–1.2)
Total Protein: 8.2 g/dL — ABNORMAL HIGH (ref 6.5–8.1)

## 2017-11-12 LAB — ACETAMINOPHEN LEVEL

## 2017-11-12 LAB — CBC
HEMATOCRIT: 39.9 % (ref 35.0–47.0)
Hemoglobin: 13.2 g/dL (ref 12.0–16.0)
MCH: 30.3 pg (ref 26.0–34.0)
MCHC: 33.1 g/dL (ref 32.0–36.0)
MCV: 91.7 fL (ref 80.0–100.0)
PLATELETS: 418 10*3/uL (ref 150–440)
RBC: 4.35 MIL/uL (ref 3.80–5.20)
RDW: 13.7 % (ref 11.5–14.5)
WBC: 16.6 10*3/uL — ABNORMAL HIGH (ref 3.6–11.0)

## 2017-11-12 LAB — SALICYLATE LEVEL: Salicylate Lvl: <7 mg/dL (ref 2.8–30.0)

## 2017-11-12 LAB — PHENYTOIN LEVEL, TOTAL: Phenytoin Lvl: 13.9 ug/mL (ref 10.0–20.0)

## 2017-11-12 LAB — HCG, QUANTITATIVE, PREGNANCY: hCG, Beta Chain, Quant, S: 6 m[IU]/mL — ABNORMAL HIGH (ref <5)

## 2017-11-12 MED ORDER — OXYCODONE-ACETAMINOPHEN 5-325 MG PO TABS
1.0000 | ORAL_TABLET | Freq: Once | ORAL | Status: AC
  Administered 2017-11-12: 1 via ORAL
  Filled 2017-11-12: qty 1

## 2017-11-12 MED ORDER — LORAZEPAM 2 MG/ML IJ SOLN
1.0000 mg | Freq: Once | INTRAMUSCULAR | Status: AC
  Administered 2017-11-12: 1 mg via INTRAVENOUS
  Filled 2017-11-12: qty 1

## 2017-11-12 MED ORDER — PHENYTOIN SODIUM EXTENDED 100 MG PO CAPS: 100.0000 mg | ORAL_CAPSULE | Freq: Three times a day (TID) | ORAL | 2 refills | Status: DC

## 2017-11-12 MED ORDER — ONDANSETRON HCL 4 MG/2ML IJ SOLN
INTRAMUSCULAR | Status: AC
  Administered 2017-11-12: 4 mg via INTRAVENOUS
  Filled 2017-11-12: qty 2

## 2017-11-12 MED ORDER — SODIUM CHLORIDE 0.9 % IV SOLN
500.0000 mg | Freq: Once | INTRAVENOUS | Status: AC
  Administered 2017-11-12: 500 mg via INTRAVENOUS
  Filled 2017-11-12: qty 10

## 2017-11-12 MED ORDER — ONDANSETRON HCL 4 MG/2ML IJ SOLN
4.0000 mg | Freq: Once | INTRAMUSCULAR | Status: AC
  Administered 2017-11-12: 4 mg via INTRAVENOUS

## 2017-11-12 MED ORDER — TRAMADOL HCL 50 MG PO TABS: 50.0000 mg | ORAL_TABLET | Freq: Four times a day (QID) | ORAL | 0 refills | Status: DC | PRN

## 2017-11-12 MED ORDER — KETOROLAC TROMETHAMINE 30 MG/ML IJ SOLN
30.0000 mg | Freq: Once | INTRAMUSCULAR | Status: AC
  Administered 2017-11-12: 30 mg via INTRAVENOUS
  Filled 2017-11-12: qty 1

## 2017-11-12 MED ORDER — SODIUM CHLORIDE 0.9 % IV SOLN
1000.0000 mg | Freq: Once | INTRAVENOUS | Status: AC
  Administered 2017-11-12: 1000 mg via INTRAVENOUS
  Filled 2017-11-12: qty 20

## 2017-11-12 MED ORDER — SODIUM CHLORIDE 0.9 % IV SOLN: 500.0000 mg | Freq: Once | INTRAVENOUS | Status: DC

## 2017-11-12 MED ORDER — ONDANSETRON HCL 4 MG/2ML IJ SOLN
4.0000 mg | Freq: Once | INTRAMUSCULAR | Status: AC
  Administered 2017-11-12: 4 mg via INTRAVENOUS
  Filled 2017-11-12: qty 2

## 2017-11-12 NOTE — ED Notes (Signed)
Patient transported to X-ray 

## 2017-11-12 NOTE — ED Notes (Signed)
Pt had phenytoin prescription bottle at bedside brought in by EMS, on count: Phenytoin: 24 Chantix: 21 Fluoxetine: 9 All in phenytoin bottle  Pills counted by this RN and Cassie, RN  Pills given to pt's sister Synetta Failnita with pt's verbal consent

## 2017-11-12 NOTE — ED Notes (Signed)
Patient transported to CT 

## 2017-11-12 NOTE — ED Provider Notes (Signed)
[facility] Emergency Department Provider Note ____________________________________________   None    (approximate)  I have reviewed the triage vital signs and the nursing notes.   HISTORY  Chief Complaint Seizures    HPI Nicole Chandler is a 57 y.o. female with past medical history as noted below including known seizure disorder presents with an apparent seizure, acute onset within the last hour when the patient was getting into a car.  The patient states that the seizure called started to fall to the ground.  She reports biting her tongue, and now reports some nausea as well as bilateral lumbar area back pain.  She denies any other injuries.  The patient states her last seizure was several months ago.  She was previously prescribed Keppra, and more recently was prescribed Dilantin after an ED visit but states she never filled the prescription has not been taking it.  She states she was in her usual state of health until this episode today.     Past Medical History:  Diagnosis Date  . Arthritis   . Bipolar 1 disorder (HCC)   . Cancer (HCC)   . Chronic back pain unk  . Depression   . Panic attacks   . Seizures [facility](HCC)     Patient Active Problem List   Diagnosis Date Noted  . 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 Seizure (HCC) 03/24/2016  . Substance induced mood disorder (HCC) 03/05/2016  . Opioid use disorder, severe, dependence (HCC) 02/25/2016  . Cocaine use disorder, moderate, dependence (HCC) 02/25/2016  . Tobacco use disorder 02/25/2016  . Opioid-induced mood disorder (HCC) 02/25/2016  . Cannabis use disorder, moderate, dependence (HCC) 02/25/2016    Past Surgical History:  Procedure Laterality Date  . ABDOMINAL SURGERY    . EYE SURGERY      Prior to Admission medications   Medication Sig Start Date End Date Taking? Authorizing Provider  diphenhydrAMINE (BENADRYL) 25 MG tablet Take 25 mg by  mouth every 6 (six) hours as needed.   Yes [provider]  gabapentin (NEURONTIN) 300 MG capsule Take 300 mg by mouth 2 (two) times daily.   Yes [provider]  Multiple Vitamins-Minerals (MULTIVITAMIN WITH MINERALS) tablet Take 1 tablet by mouth daily.   Yes [provider]  albuterol (PROVENTIL HFA;VENTOLIN HFA) 108 (90 Base) MCG/ACT inhaler Inhale 2 puffs into the lungs every 6 (six) hours as needed. 10/04/16   Myrna BlazerSchaevitz, David Matthew, MD  diclofenac (VOLTAREN) 75 MG EC tablet Take 1 tablet (75 mg total) by mouth 2 (two) times daily. Patient not taking: Reported on 10/04/2016 06/28/16   Evangeline DakinBeers, Charles M, PA-C  diclofenac sodium (VOLTAREN) 1 % GEL Apply 2 g topically 4 (four) times daily. Patient not taking: Reported on 11/12/2017 04/25/17   Irean HongSung, Jade J, MD  meloxicam (MOBIC) 15 MG tablet Take 1 tablet (15 mg total) by mouth daily. Patient not taking: Reported on 11/12/2017 04/09/17   Cuthriell, Delorise RoyalsJonathan D, PA-C  methocarbamol (ROBAXIN) 500 MG tablet Take 1 tablet (500 mg total) by mouth 4 (four) times daily. Patient not taking: Reported on 11/12/2017 04/09/17   Cuthriell, Delorise RoyalsJonathan D, PA-C  metoCLOPramide (REGLAN) 10 MG tablet Take 1 tablet (10 mg total) by mouth every 6 (six) hours as needed. Patient not taking: Reported on 10/04/2016 07/03/16   Sharman CheekStafford, Phillip, MD  mirtazapine (REMERON) 15 MG tablet Take 1 tablet (15 mg total) at bedtime by mouth. Patient not taking: Reported on 11/12/2017  07/08/17 08/07/17  Clapacs, Jackquline Denmark, MD  omeprazole (PRILOSEC OTC) 20 MG tablet Take 1 tablet (20 mg total) by mouth daily. Patient not taking: Reported on 10/04/2016 06/30/16 06/30/17  Loleta Rose, MD  ondansetron (ZOFRAN ODT) 4 MG disintegrating tablet Allow 1-2 tablets to dissolve in your mouth every 8 hours as needed for nausea/vomiting Patient not taking: Reported on 10/04/2016 06/30/16   Loleta Rose, MD  phenytoin (DILANTIN) 100 MG ER capsule Take 1 capsule (100 mg total) by mouth 3  (three) times daily. 11/12/17 02/10/18  Dionne Bucy, MD  sucralfate (CARAFATE) 1 g tablet Take 1 tablet (1 g total) by mouth 4 (four) times daily as needed (for abdominal discomfort, nausea, and/or vomiting). Patient not taking: Reported on 10/04/2016 06/30/16   Loleta Rose, MD  traMADol (ULTRAM) 50 MG tablet Take 1 tablet (50 mg total) by mouth every 6 (six) hours as needed for moderate pain. 11/12/17   Dionne Bucy, MD    Allergies Patient has no known allergies.  Family History  Problem Relation Age of Onset  . Alzheimer's disease Mother   . AAA (abdominal aortic aneurysm) Father   . Breast cancer Sister 60    Social History Social History   Tobacco Use  . Smoking status: Current Every Day Smoker    Packs/day: 1.00    Types: Cigarettes    Last attempt to quit: 08/27/2014    Years since quitting: 3.2  . Smokeless tobacco: Never Used  Substance Use Topics  . Alcohol use: No  . Drug use: Yes    Types: Marijuana, Cocaine    Review of Systems  Constitutional: No fever. Eyes: No visual changes. ENT: No neck pain. Cardiovascular: Denies chest pain. Respiratory: Denies shortness of breath. Gastrointestinal: Positive for nausea.  Genitourinary: Negative for flank pain.  Musculoskeletal: Positive for back pain. Skin: Negative for abrasions or lacerations. Neurological: Positive for seizure.   ____________________________________________   PHYSICAL EXAM:  VITAL SIGNS: ED Triage Vitals  Enc Vitals Group     BP 11/12/17 1252 136/83     Pulse Rate 11/12/17 1252 94     Resp 11/12/17 1252 20     Temp 11/12/17 1252 98.2 F (36.8 C)     Temp Source 11/12/17 1252 Oral     SpO2 11/12/17 1252 97 %     Weight 11/12/17 1254 130 lb (59 kg)     Height 11/12/17 1254 5\' 2"  (1.575 m)     Head Circumference --      Peak Flow --      Pain Score 11/12/17 1253 8     Pain Loc --      Pain Edu? --      Excl. in GC? --     Constitutional: Alert and oriented.  Anxious  and tearful but not ill-appearing. Eyes: Conjunctivae are normal.  EOMI. Head: Atraumatic. Nose: No congestion/rhinnorhea. Mouth/Throat: Mucous membranes are moist.   Neck: Normal range of motion.  Cardiovascular: Normal rate, regular rhythm. Grossly normal heart sounds.  Good peripheral circulation. Respiratory: Normal respiratory effort.  No retractions. Lungs CTAB. Gastrointestinal: No distention.  Musculoskeletal:  Extremities warm and well perfused.  Mild midline and bilateral paraspinal lumbar tenderness. Neurologic: Motor and sensory intact in all extremities.  Normal coordination.  Normal speech and language. Skin:  Skin is warm and dry. No rash noted. Psychiatric: Mood and affect are normal. Speech and behavior are normal.  ____________________________________________   LABS (all labs ordered are listed, but only abnormal results are displayed)  Labs Reviewed  BASIC METABOLIC PANEL - Abnormal; Notable for the following components:      Result Value   Glucose, Bld 135 (*)    BUN <5 (*)    All other components within normal limits  CBC - Abnormal; Notable for the following components:   WBC 16.6 (*)    All other components within normal limits  CBG MONITORING, ED   ____________________________________________  EKG  ED ECG REPORT I, Dionne Bucy, the attending physician, personally viewed and interpreted this ECG.  Date: 11/12/2017 EKG Time: 1257 Rate: 93 Rhythm: normal sinus rhythm QRS Axis: normal Intervals: normal ST/T Wave abnormalities: normal Narrative Interpretation: no evidence of acute ischemia  ____________________________________________  RADIOLOGY  XR lumbar spine: No acute fractures  ____________________________________________   PROCEDURES  Procedure(s) performed: No  Procedures  Critical Care performed: No ____________________________________________   INITIAL IMPRESSION / ASSESSMENT AND PLAN / ED COURSE  Pertinent labs &  imaging results that were available during my care of the patient were reviewed by me and considered in my medical decision making (see chart for details).  57 year old female with known seizure disorder who is noncompliant with medication presents with an apparent seizure, causing her to fall from standing height.  She reports bilateral lumbar area back pain as a result.  I reviewed the past medical records in Epic; the patient was most recently seen in the ED in November of last year with a similar report of seizure and medication noncompliance.  She received a prescription for Dilantin at that time but states she never filled it and has not been taking any seizure medication.  On exam, there is no evidence of trauma, vital signs are normal, the patient is anxious and tearful but otherwise well-appearing.  She does have some lumbar tenderness.  We will obtain basic labs, load her with IV Dilantin, and obtain lumbar spine x-rays.  Anticipate discharge home once workup is complete.   ----------------------------------------- 4:46 PM on 11/12/2017 -----------------------------------------  X-Chandler of the lumbar spine was negative.  Lab workup is unremarkable.  Patient reported improved pain.  No recurrent seizures.  The patient states that she simply did not feel like she needed to be on seizure medication because she "feels fine" in between seizures.  I counseled the patient extensively on the risk of recurrent seizures and the importance of being on a seizure medication if she has a known seizure disorder.  She expressed understanding and agreed to take a new prescription for Dilantin.  Return precautions given and the patient also expressed understanding of these.  ____________________________________________   FINAL CLINICAL IMPRESSION(S) / ED DIAGNOSES  Final diagnoses:  Seizure (HCC)      NEW MEDICATIONS STARTED DURING THIS VISIT:  Discharge Medication List as of 11/12/2017  4:03 PM      START taking these medications   Details  traMADol (ULTRAM) 50 MG tablet Take 1 tablet (50 mg total) by mouth every 6 (six) hours as needed for moderate pain., Starting Thu 11/12/2017, Print         Note:  This document was prepared using Dragon voice recognition software and may include unintentional dictation errors.    Dionne Bucy, MD 11/12/17 [phone]

## 2017-11-12 NOTE — Discharge Instructions (Addendum)
You should take the Dilantin as prescribed until you arrange follow-up.  Follow-up with your primary care doctor, and you should likely be following with a neurologist as well.  Return to the emergency department for new, worsening, or persistent seizures, worsening back pain, headache, or any other new or worsening symptoms that concern you.

## 2017-11-12 NOTE — ED Notes (Signed)
Sitter at bedside for safety.

## 2017-11-12 NOTE — ED Triage Notes (Signed)
Pt in via [facility], pt reports having a seizure today as she was getting in a car, reports falling to ground, complaints of lower back pain and headache.  Pt A/Ox4 upon arrival, tearful due to pain.  Pt reports hx of seizures, denies taking medication as prescribed.

## 2017-11-12 NOTE — ED Notes (Addendum)
When pt was asked how much gabapentin she took while at home by [facility], pt states "I did not take any."

## 2017-11-12 NOTE — ED Provider Notes (Addendum)
[facility] Emergency Department Provider Note   ____________________________________________   First MD Initiated Contact with Patient 11/12/17 2053     (approximate)  I have reviewed the triage vital signs and the nursing notes.   HISTORY  Chief Complaint Seizures   HPI Nicole Chandler is a 57 y.o. female Who has a history of having seizure disorder she's been on Dilantin but has not taken it for months. She had a seizure today was seen by Dr. Riley Kill loaded with Dilantin was doing well and she was discharged. She went home and apparently may have overdosed on Dilantin per EMS is that family told them. We do not know if she did and do not know how much she overdosed on she then had another seizure EMS got there she was confused and combative and banging her head they gave her 4 of Versed and then had to give her another 4 Versed she is now sleeping but easily arousable and following most commands.   Past Medical History:  Diagnosis Date  . Arthritis   . Bipolar 1 disorder (HCC)   . Cancer (HCC)   . Chronic back pain unk  . Depression   . Panic attacks   . Seizures [facility])     Patient Active Problem List   Diagnosis Date Noted  . 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 Seizure (HCC) 03/24/2016  . Substance induced mood disorder (HCC) 03/05/2016  . Opioid use disorder, severe, dependence (HCC) 02/25/2016  . Cocaine use disorder, moderate, dependence (HCC) 02/25/2016  . Tobacco use disorder 02/25/2016  . Opioid-induced mood disorder (HCC) 02/25/2016  . Cannabis use disorder, moderate, dependence (HCC) 02/25/2016    Past Surgical History:  Procedure Laterality Date  . ABDOMINAL SURGERY    . EYE SURGERY      Prior to Admission medications   Medication Sig Start Date End Date Taking? Authorizing Provider  albuterol (PROVENTIL HFA;VENTOLIN HFA) 108 (90 Base) MCG/ACT inhaler Inhale 2 puffs into the lungs  every 6 (six) hours as needed. 10/04/16   Myrna Blazer, MD  diclofenac (VOLTAREN) 75 MG EC tablet Take 1 tablet (75 mg total) by mouth 2 (two) times daily. Patient not taking: Reported on 10/04/2016 06/28/16   Evangeline Dakin, PA-C  diclofenac sodium (VOLTAREN) 1 % GEL Apply 2 g topically 4 (four) times daily. Patient not taking: Reported on 11/12/2017 04/25/17   Irean Hong, MD  diphenhydrAMINE (BENADRYL) 25 MG tablet Take 25 mg by mouth every 6 (six) hours as needed.    [provider]  gabapentin (NEURONTIN) 300 MG capsule Take 300 mg by mouth 2 (two) times daily.    [provider]  meloxicam (MOBIC) 15 MG tablet Take 1 tablet (15 mg total) by mouth daily. Patient not taking: Reported on 11/12/2017 04/09/17   Cuthriell, Delorise Royals, PA-C  methocarbamol (ROBAXIN) 500 MG tablet Take 1 tablet (500 mg total) by mouth 4 (four) times daily. Patient not taking: Reported on 11/12/2017 04/09/17   Cuthriell, Delorise Royals, PA-C  metoCLOPramide (REGLAN) 10 MG tablet Take 1 tablet (10 mg total) by mouth every 6 (six) hours as needed. Patient not taking: Reported on 10/04/2016 07/03/16   Sharman Cheek, MD  mirtazapine (REMERON) 15 MG tablet Take 1 tablet (15 mg total) at bedtime by mouth. Patient not taking: Reported on 11/12/2017 07/08/17 08/07/17  Clapacs, Jackquline Denmark, MD  Multiple Vitamins-Minerals (MULTIVITAMIN WITH MINERALS) tablet Take 1 tablet by mouth daily.  [provider]  omeprazole (PRILOSEC OTC) 20 MG tablet Take 1 tablet (20 mg total) by mouth daily. Patient not taking: Reported on 10/04/2016 06/30/16 06/30/17  Loleta RoseForbach, Cory, MD  ondansetron (ZOFRAN ODT) 4 MG disintegrating tablet Allow 1-2 tablets to dissolve in your mouth every 8 hours as needed for nausea/vomiting Patient not taking: Reported on 10/04/2016 06/30/16   Loleta RoseForbach, Cory, MD  phenytoin (DILANTIN) 100 MG ER capsule Take 1 capsule (100 mg total) by mouth 3 (three) times daily. 11/12/17 02/10/18  Dionne BucySiadecki,  Sebastian, MD  sucralfate (CARAFATE) 1 g tablet Take 1 tablet (1 g total) by mouth 4 (four) times daily as needed (for abdominal discomfort, nausea, and/or vomiting). Patient not taking: Reported on 10/04/2016 06/30/16   Loleta RoseForbach, Cory, MD  traMADol (ULTRAM) 50 MG tablet Take 1 tablet (50 mg total) by mouth every 6 (six) hours as needed for moderate pain. 11/12/17   Dionne BucySiadecki, Sebastian, MD    Allergies Patient has no known allergies.  Family History  Problem Relation Age of Onset  . Alzheimer's disease Mother   . AAA (abdominal aortic aneurysm) Father   . Breast cancer Sister 2355    Social History Social History   Tobacco Use  . Smoking status: Current Every Day Smoker    Packs/day: 1.00    Types: Cigarettes    Last attempt to quit: 08/27/2014    Years since quitting: 3.2  . Smokeless tobacco: Never Used  Substance Use Topics  . Alcohol use: No  . Drug use: Yes    Types: Marijuana, Cocaine    Review of Systems  unable to obtain due to postictal state ____________________________________________   PHYSICAL EXAM:  VITAL SIGNS: ED Triage Vitals  Enc Vitals Group     BP      Pulse      Resp      Temp      Temp src      SpO2      Weight      Height      Head Circumference      Peak Flow      Pain Score      Pain Loc      Pain Edu?      Excl. in GC?     Constitutional: sleepy but arousable. Eyes: Conjunctivae are normal. PER. EOMI. Head: Atraumatic. Nose: No congestion/rhinnorhea. Mouth/Throat: Mucous membranes are moist.  Oropharynx non-erythematous. Neck: No stridor.   Cardiovascular: Normal rate, regular rhythm. Grossly normal heart sounds.  Good peripheral circulation. Respiratory: Normal respiratory effort.  No retractions. Lungs CTAB. Gastrointestinal: Soft and nontender. No distention. No abdominal bruits. No CVA tenderness. Musculoskeletal: No lower extremity tenderness nor edema.  . Neurologic: No gross focal neurologic deficits are appreciated. Skin:   Skin is warm, dry and intact. No rash noted.   ____________________________________________   LABS (all labs ordered are listed, but only abnormal results are displayed)  Labs Reviewed  ACETAMINOPHEN LEVEL - Abnormal; Notable for the following components:      Result Value   Acetaminophen (Tylenol), Serum <10 (*)    All other components within normal limits  COMPREHENSIVE METABOLIC PANEL - Abnormal; Notable for the following components:   Potassium 3.4 (*)    Chloride 99 (*)    CO2 20 (*)    Glucose, Bld 173 (*)    Creatinine, Ser 1.10 (*)    Total Protein 8.2 (*)    AST 42 (*)    ALT 7 (*)    GFR  calc non Af Amer 55 (*)    Anion gap 18 (*)    All other components within normal limits  CBC WITH DIFFERENTIAL/PLATELET - Abnormal; Notable for the following components:   WBC 14.9 (*)    Platelets 448 (*)    Neutro Abs 13.6 (*)    Lymphs Abs 0.8 (*)    All other components within normal limits  HCG, QUANTITATIVE, PREGNANCY - Abnormal; Notable for the following components:   hCG, Beta Chain, Quant, S 6 (*)    All other components within normal limits  PHENYTOIN LEVEL, TOTAL  SALICYLATE LEVEL  URINE DRUG SCREEN, QUALITATIVE ([facility] ONLY)   ____________________________________________  EKG  EKG read and interpreted by me shows normal sinus rhythm rate of 89 normal axis irregular baseline but no acute ST-T changes are seen ____________________________________________  RADIOLOGY  ED MD interpretation:  CT of the head is read as normal except for chronic vascular changes  Official radiology report(s): Dg Lumbar Spine 2-3 Views  Result Date: 11/12/2017 CLINICAL DATA:  Seizure.  Fall EXAM: LUMBAR SPINE - 2-3 VIEW COMPARISON:  CT 07/01/2016. FINDINGS: No acute bony abnormality identified. No evidence of fracture. Normal alignment. Paraspinal soft tissues are unremarkable. IMPRESSION: No acute abnormality identified. Electronically Signed   By: Maisie Fus  Register   On: 11/12/2017  14:00   Ct Head Wo Contrast  Result Date: 11/12/2017 CLINICAL DATA:  Seizure after being discharged. EXAM: CT HEAD WITHOUT CONTRAST TECHNIQUE: Contiguous axial images were obtained from the base of the skull through the vertex without intravenous contrast. COMPARISON:  03/24/2016 FINDINGS: BRAIN: The ventricles and sulci are normal. No intraparenchymal hemorrhage, mass effect nor midline shift. No acute large vascular territory infarcts. Grey-white matter distinction is maintained. The basal ganglia are unremarkable. No abnormal extra-axial fluid collections. Basal cisterns are not effaced and midline. The brainstem and cerebellar hemispheres are without acute abnormalities. VASCULAR: Unremarkable. SKULL/SOFT TISSUES: No skull fracture. No significant soft tissue swelling. ORBITS/SINUSES: The included ocular globes and orbital contents are normal.The mastoid air cells are clear. The included paranasal sinuses demonstrate mild circumferential mucosal thickening of the maxillary and sphenoid sinuses with mild-to-moderate ethmoid sinus mucosal thickening. OTHER: None. IMPRESSION: No acute intracranial abnormality or skull fracture. Mild chronic appearing paranasal sinus mucosal thickening. Electronically Signed   By: Tollie Eth M.D.   On: 11/12/2017 22:58    ____________________________________________   PROCEDURES  Procedure(s) performed:   Procedures  Critical Care performed:   ____________________________________________   INITIAL IMPRESSION / ASSESSMENT AND PLAN / ED COURSE   patient is still acting postictal. I will sign her out to Dr. York Cerise. Poison control recommends for more hours of observation because of the possibility gabapentin overdose. This may also be influencing her mental status. Additionally I will give her another 500 mg of fosphenytoin IV as her Dilantin level was only 13.         ____________________________________________   FINAL CLINICAL IMPRESSION(S) / ED  DIAGNOSES  Final diagnoses:  Seizure [facility])     ED Discharge Orders    None       Note:  This document was prepared using Dragon voice recognition software and may include unintentional dictation errors.    Arnaldo Natal, MD 11/12/17 2322    Arnaldo Natal, MD 11/12/17 [phone]

## 2017-11-12 NOTE — ED Triage Notes (Signed)
Per EMS, pt recently discharged from this ED and went home. EMS reports pt had another seizure and EMS was called. EMS states pt has hx of seizures however stopped taking Dilantin in Nov 2018. EMS states pt was combative on their arrival and was given 8mg  total of versed. Pt alert to voice at this time. EMS also states family at home states pt "may have taken too much of her gabapentin" prior to EMS being called.

## 2017-11-13 DIAGNOSIS — R569 Unspecified convulsions: Secondary | ICD-10-CM

## 2017-11-13 LAB — URINE DRUG SCREEN, QUALITATIVE (ARMC ONLY)
Amphetamines, Ur Screen: NOT DETECTED
BARBITURATES, UR SCREEN: NOT DETECTED
BENZODIAZEPINE, UR SCRN: POSITIVE — AB
COCAINE METABOLITE, UR ~~LOC~~: NOT DETECTED
Cannabinoid 50 Ng, Ur ~~LOC~~: POSITIVE — AB
MDMA (Ecstasy)Ur Screen: NOT DETECTED
METHADONE SCREEN, URINE: NOT DETECTED
Opiate, Ur Screen: NOT DETECTED
Phencyclidine (PCP) Ur S: NOT DETECTED
TRICYCLIC, UR SCREEN: NOT DETECTED

## 2017-11-13 MED ORDER — HALOPERIDOL LACTATE 5 MG/ML IJ SOLN
1.0000 mg | Freq: Once | INTRAMUSCULAR | Status: AC
  Administered 2017-11-13: 1 mg via INTRAVENOUS
  Filled 2017-11-13: qty 1

## 2017-11-13 MED ORDER — DIPHENHYDRAMINE HCL 50 MG/ML IJ SOLN
25.0000 mg | INTRAMUSCULAR | Status: AC
  Administered 2017-11-13: 25 mg via INTRAVENOUS
  Filled 2017-11-13: qty 1

## 2017-11-13 MED ORDER — SODIUM CHLORIDE 0.9 % IV BOLUS (SEPSIS)
1000.0000 mL | INTRAVENOUS | Status: AC
  Administered 2017-11-13: 1000 mL via INTRAVENOUS

## 2017-11-13 NOTE — ED Notes (Signed)
Nicole Chandler, daughter [phone], call first in emergency Alinda Moneyony, son, [phone] Leonette MostCharles, boyfriend, [phone]

## 2017-11-13 NOTE — ED Notes (Signed)
Pt dry heaving, verbal order for IV 4mg  zofran.

## 2017-11-13 NOTE — ED Notes (Addendum)
Family at bedside given update on pt's plan of care, family verbalized understanding.  Per Leonette Mostharles, pt's boy friend, pt has hx of substance abuse, states pt currently takes suboxone. Leonette MostCharles states pt has been stressed recently and has chronic back pain. Leonette MostCharles states pt has been moaning more often and appears uncomfortable, also crawls on the floor to bathroom.

## 2017-11-13 NOTE — ED Notes (Signed)
Patient continues to moan and move around as if uncomfortable tossing and turning, but denies pain at this time. She is alert and oriented, and [facility] as recommended per MD order.

## 2017-11-13 NOTE — ED Provider Notes (Signed)
-----------------------------------------   1:36 AM on 11/13/2017 -----------------------------------------  Assumed care from Dr. Juliette AlcideMelinda at 11:00 PM.  The patient reportedly has had seizures or seizure-like activity in the past, was seen yesterday after a seizure-like episode, restarted on Dilantin and reportedly had a loading dose, but then had another seizure-like episode at home.  She was acting bizarrely, banging her head into a wall, and received at least 8 mg of Versed by EMS.  She was quite somnolent as a result but is still acting bizarre, refusing to communicate, sobbing, etc.  There was some question of whether she has overdosed either accidentally or unintentionally on gabapentin and poison control was contacted by Dr. Darnelle CatalanMalinda.  She has been stable in the emergency department for more than 4 hours.  I went into talk to her and she was refusing to talk to me, pulling the blanket over her head, sobbing or pretending to sob, stating she is cold and refusing to talk to me about what happened or why she is acting the way she currently is acting.  Given her history, both past medical/psychiatric and history of present illness, I have ordered a psych consult and we will continue to observe her in the emergency department or BHU.  She is not dischargeable at this time although there is no evidence of any acute or emergent medical condition that would require admission   ----------------------------------------- 3:06 AM on 11/13/2017 -----------------------------------------  Patient vomiting, and I received reported that she has had dry heaves several times.  Will provide 1L NS and antiemetics.  Question the possibility of withdrawal symptoms, and reportedly she may be on Suboxone, but her vital signs are stable (no tachycardia) and her UDS was positive only for benzos (administered by EMS) and cannabinoids.     ----------------------------------------- 5:47 AM on  11/13/2017 -----------------------------------------  Patient calm and sleeping.  No additional vomiting.  Will have psych consult during the day, but she is voluntary if she awakens and feels stable and ready to go home.   Loleta RoseForbach, Thad Osoria, MD 11/13/17 [phone]

## 2017-11-13 NOTE — ED Notes (Signed)
Pt pulling covers off then back on then over head as well as appearing to cry however abruptly stops and states loudly she is cold then removes blankets. Pt rolls from lying on stomach to her side. Pt begins to have very loud audible dry heaving however is unable to produce emesis. EDP notified.

## 2017-11-13 NOTE — ED Notes (Signed)
Family at bedside given update on pt's plan of care, notified EDP will be in to give update as well as soon as he can, family verbalized understanding of this

## 2017-11-13 NOTE — ED Notes (Signed)
Stat desk called to state step son was in the lobby to pick up patient. Pt given blue scrubs to change into.

## 2017-11-13 NOTE — ED Notes (Signed)
Pt moving around in bed and moaning, pt given therapeutic communication to rest, pt responded and is resting at this time. Pt states she is cold, warm blankets given to pt.

## 2017-11-13 NOTE — ED Notes (Signed)
[facility].Patient alert and orientedx4.Patient keeps moaning but denies pain or discomfort at this time.Made comfortable in bed.

## 2017-11-13 NOTE — ED Provider Notes (Signed)
case discussed with Dr. Toni Amendlapacs after his evaluation. He finds the patient to be psychiatrically stable and recommends discharge. She has outpatient follow-up. She appears to be medically stable.   Sharman CheekStafford, Harley Mccartney, MD 11/13/17 1515

## 2017-11-13 NOTE — Consult Note (Signed)
[facility] Face-to-Face Psychiatry Consult   Reason for Consult: Consult for this 57 year old woman with a history of substance abuse and seizure disorders who came into the hospital with seizures. Referring Physician: Joni Fears Patient Identification: Nicole Chandler MRN:  [id number] Principal Diagnosis: Seizure [facility]) Diagnosis:   Patient Active Problem List   Diagnosis Date Noted  . Postictal state ([place]) [R56.9] 11/13/2017  . Adjustment disorder with mixed disturbance of emotions and conduct [F43.25] 07/08/2017  . Seizure ([place]) [R56.9] 03/24/2016  . Substance induced mood disorder ([place]) [F19.94] 03/05/2016  . Opioid use disorder, severe, dependence ([place]) [F11.20] 02/25/2016  . Cocaine use disorder, moderate, dependence ([place]) [F14.20] 02/25/2016  . Tobacco use disorder [F17.200] 02/25/2016  . Opioid-induced mood disorder ([place]) [F11.94] 02/25/2016  . Cannabis use disorder, moderate, dependence ([place]) [F12.20] 02/25/2016    Total Time spent with patient: 1 hour  Subjective:   Nicole Chandler is a 57 y.o. female patient admitted with "I do not know".  HPI: 57 year old woman with a history of seizure disorder and substance abuse disorder came into the hospital with reports that she had a seizure.  Patient was stabilized in the emergency room.  When I saw her the patient was sleepy but easily arousable.  She became fully awake during the interview.  Despite this she was a very difficult historian.  She reported not being able to remember almost anything about what brought her into the hospital and seemed to have little interest in it.  She said she figured she probably had a seizure because may be somebody told her that.  She claimed to have no memory of any of it.  Patient denied any mood symptoms.  Denied being depressed or angry or agitated.  Denied any suicidal or homicidal thoughts.  Denied any recent hallucinations or psychotic symptoms at all.  Claims that she is fully compliant with her medicine  although she cannot remember the names of any of the medicines that she takes.  Patient is supposed to be on Dilantin according to the chart.  Level was positive.  She denies that she is been using any kind of drugs or drinking recently.  Medical history: Patient has a history of seizure disorder of unclear etiology.  Multiple substance abuse problems.  Social history: Lives with a boyfriend.  Has family who are concerned about her but do not seem to see her very often.  Substance abuse history: Multiple substance abuse problems including opiate abuse for which she continues to be prescribed Suboxone.  She says she is compliant with that.  Past Psychiatric History: Patient has been seen by psychiatric services before mostly for behavior and mood problems in the context of drug abuse.  No known history of cardiac disorder.  Mood symptoms usually transient and often situational.  Currently followed up at [facility] for Suboxone maintenance.  Risk to Self: Suicidal Ideation: No-Not Currently/Within Last 6 Months Suicidal Intent: No Is patient at risk for suicide?: No Suicidal Plan?: No-Not Currently/Within Last 6 Months Access to Means: No What has been your use of drugs/alcohol within the last 12 months?: no How many times?: 0 Other Self Harm Risks: no Triggers for Past Attempts: None known Intentional Self Injurious Behavior: None Risk to Others: Homicidal Ideation: No Thoughts of Harm to Others: No Current Homicidal Intent: No Current Homicidal Plan: No Access to Homicidal Means: No Identified Victim: n/a History of harm to others?: No Assessment of Violence: None Noted Violent Behavior Description: none Does patient have access to weapons?: No  Criminal Charges Pending?: No Does patient have a court date: No Prior Inpatient Therapy: Prior Inpatient Therapy: No Prior Therapy Dates: none Prior Therapy Facilty/Provider(s): none Reason for Treatment: none  Prior Outpatient Therapy: Prior  Outpatient Therapy: No Does patient have an ACCT team?: No Does patient have Intensive In-House Services?  : No Does patient have [facility] services? : No Does patient have P4CC services?: No  Past Medical History:  Past Medical History:  Diagnosis Date  . Arthritis   . Bipolar 1 disorder ([place])   . Cancer ([place])   . Chronic back pain unk  . Depression   . Panic attacks   . Seizures ([place])     Past Surgical History:  Procedure Laterality Date  . ABDOMINAL SURGERY    . EYE SURGERY     Family History:  Family History  Problem Relation Age of Onset  . Alzheimer's disease Mother   . AAA (abdominal aortic aneurysm) Father   . Breast cancer Sister 54   Family Psychiatric  History: None known Social History:  Social History   Substance and Sexual Activity  Alcohol Use No     Social History   Substance and Sexual Activity  Drug Use Yes  . Types: Marijuana, Cocaine    Social History   Socioeconomic History  . Marital status: Married    Spouse name: Not on file  . Number of children: Not on file  . Years of education: Not on file  . Highest education level: Not on file  Occupational History  . Not on file  Social Needs  . Financial resource strain: Not on file  . Food insecurity:    Worry: Not on file    Inability: Not on file  . Transportation needs:    Medical: Not on file    Non-medical: Not on file  Tobacco Use  . Smoking status: Current Every Day Smoker    Packs/day: 1.00    Types: Cigarettes    Last attempt to quit: 08/27/2014    Years since quitting: 3.2  . Smokeless tobacco: Never Used  Substance and Sexual Activity  . Alcohol use: No  . Drug use: Yes    Types: Marijuana, Cocaine  . Sexual activity: Not Currently  Lifestyle  . Physical activity:    Days per week: Not on file    Minutes per session: Not on file  . Stress: Not on file  Relationships  . Social connections:    Talks on phone: Not on file    Gets together: Not on file    Attends  religious service: Not on file    Active member of club or organization: Not on file    Attends meetings of clubs or organizations: Not on file    Relationship status: Not on file  Other Topics Concern  . Not on file  Social History Narrative  . Not on file   Additional Social History:    Allergies:  No Known Allergies  Labs:  Results for orders placed or performed during the hospital encounter of 11/12/17 (from the past 48 hour(s))  Phenytoin level, total     Status: None   Collection Time: 11/12/17  9:15 PM  Result Value Ref Range   Phenytoin Lvl 13.9 10.0 - 20.0 ug/mL    Comment: Performed at [facility], [address] [place], [place] [address]  Salicylate level     Status: None   Collection Time: 11/12/17  9:15 PM  Result Value Ref Range  Salicylate Lvl <4.1 2.8 - 30.0 mg/dL    Comment: Performed at [facility], [address] [place], [place] [address]  Acetaminophen level     Status: Abnormal   Collection Time: 11/12/17  9:15 PM  Result Value Ref Range   Acetaminophen (Tylenol), Serum <10 (L) 10 - 30 ug/mL    Comment:        THERAPEUTIC CONCENTRATIONS VARY SIGNIFICANTLY. A RANGE OF 10-30 ug/mL MAY BE AN EFFECTIVE CONCENTRATION FOR MANY PATIENTS. HOWEVER, SOME ARE BEST TREATED AT CONCENTRATIONS OUTSIDE THIS RANGE. ACETAMINOPHEN CONCENTRATIONS >150 ug/mL AT 4 HOURS AFTER INGESTION AND >50 ug/mL AT 12 HOURS AFTER INGESTION ARE OFTEN ASSOCIATED WITH TOXIC REACTIONS. Performed at [facility], [address] [place], [place] [address]   Comprehensive metabolic panel     Status: Abnormal   Collection Time: 11/12/17  9:15 PM  Result Value Ref Range   Sodium 137 135 - 145 mmol/L   Potassium 3.4 (L) 3.5 - 5.1 mmol/L   Chloride 99 (L) 101 - 111 mmol/L   CO2 20 (L) 22 - 32 mmol/L   Glucose, Bld 173 (H) 65 - 99 mg/dL   BUN 6 6 - 20 mg/dL   Creatinine, Ser 1.10 (H) 0.44 - 1.00 mg/dL   Calcium 9.0 8.9 - 10.3 mg/dL   Total Protein  8.2 (H) 6.5 - 8.1 g/dL   Albumin 4.0 3.5 - 5.0 g/dL   AST 42 (H) 15 - 41 U/L   ALT 7 (L) 14 - 54 U/L   Alkaline Phosphatase 71 38 - 126 U/L   Total Bilirubin 0.4 0.3 - 1.2 mg/dL   GFR calc non Af Amer 55 (L) >60 mL/min   GFR calc Af Amer >60 >60 mL/min    Comment: (NOTE) The eGFR has been calculated using the CKD EPI equation. This calculation has not been validated in all clinical situations. eGFR's persistently <60 mL/min signify possible Chronic Kidney Disease.    Anion gap 18 (H) 5 - 15    Comment: Performed at [facility], [address] [place], [place] [address]  CBC with Differential     Status: Abnormal   Collection Time: 11/12/17  9:15 PM  Result Value Ref Range   WBC 14.9 (H) 3.6 - 11.0 K/uL   RBC 4.19 3.80 - 5.20 MIL/uL   Hemoglobin 12.7 12.0 - 16.0 g/dL   HCT 38.5 35.0 - 47.0 %   MCV 91.7 80.0 - 100.0 fL   MCH 30.3 26.0 - 34.0 pg   MCHC 33.1 32.0 - 36.0 g/dL   RDW 12.9 11.5 - 14.5 %   Platelets 448 (H) 150 - 440 K/uL   Neutrophils Relative % 91 %   Neutro Abs 13.6 (H) 1.4 - 6.5 K/uL   Lymphocytes Relative 6 %   Lymphs Abs 0.8 (L) 1.0 - 3.6 K/uL   Monocytes Relative 3 %   Monocytes Absolute 0.5 0.2 - 0.9 K/uL   Eosinophils Relative 0 %   Eosinophils Absolute 0.0 0 - 0.7 K/uL   Basophils Relative 0 %   Basophils Absolute 0.0 0 - 0.1 K/uL    Comment: Performed at [facility], [address] [place], [place] [address]  hCG, quantitative, pregnancy     Status: Abnormal   Collection Time: 11/12/17  9:15 PM  Result Value Ref Range   hCG, Beta Chain, Quant, S 6 (H) <5 mIU/mL    Comment:          GEST. AGE  CONC.  (mIU/mL)   <=1 WEEK        5 - 50     2 WEEKS       50 - 500     3 WEEKS       100 - 10,000     4 WEEKS     1,000 - 30,000     5 WEEKS     3,500 - 115,000   6-8 WEEKS     12,000 - 270,000    12 WEEKS     15,000 - 220,000        FEMALE AND NON-PREGNANT FEMALE:     LESS THAN 5 mIU/mL Performed at [facility], [address], [place], [place] [address]   Urine Drug Screen, Qualitative     Status: Abnormal   Collection Time: 11/12/17 11:30 PM  Result Value Ref Range   Tricyclic, Ur Screen NONE DETECTED NONE DETECTED   Amphetamines, Ur Screen NONE DETECTED NONE DETECTED   MDMA (Ecstasy)Ur Screen NONE DETECTED NONE DETECTED   Cocaine Metabolite,Ur [place] NONE DETECTED NONE DETECTED   Opiate, Ur Screen NONE DETECTED NONE DETECTED   Phencyclidine (PCP) Ur S NONE DETECTED NONE DETECTED   Cannabinoid 50 Ng, Ur [place] POSITIVE (A) NONE DETECTED   Barbiturates, Ur Screen NONE DETECTED NONE DETECTED   Benzodiazepine, Ur Scrn POSITIVE (A) NONE DETECTED   Methadone Scn, Ur NONE DETECTED NONE DETECTED    Comment: (NOTE) Tricyclics + metabolites, urine    Cutoff 1000 ng/mL Amphetamines + metabolites, urine  Cutoff 1000 ng/mL MDMA (Ecstasy), urine              Cutoff 500 ng/mL Cocaine Metabolite, urine          Cutoff 300 ng/mL Opiate + metabolites, urine        Cutoff 300 ng/mL Phencyclidine (PCP), urine         Cutoff 25 ng/mL Cannabinoid, urine                 Cutoff 50 ng/mL Barbiturates + metabolites, urine  Cutoff 200 ng/mL Benzodiazepine, urine              Cutoff 200 ng/mL Methadone, urine                   Cutoff 300 ng/mL The urine drug screen provides only a preliminary, unconfirmed analytical test result and should not be used for non-medical purposes. Clinical consideration and professional judgment should be applied to any positive drug screen result due to possible interfering substances. A more specific alternate chemical method must be used in order to obtain a confirmed analytical result. Gas chromatography / mass spectrometry (GC/MS) is the preferred confirmat ory method. Performed at [facility], [address] [place], [place] [address]     No current facility-administered medications for this encounter.    Current Outpatient Medications  Medication Sig Dispense Refill  .  albuterol (PROVENTIL HFA;VENTOLIN HFA) 108 (90 Base) MCG/ACT inhaler Inhale 2 puffs into the lungs every 6 (six) hours as needed. 1 Inhaler 0  . diphenhydrAMINE (BENADRYL) 25 MG tablet Take 25 mg by mouth every 6 (six) hours as needed for allergies.     Marland Kitchen gabapentin (NEURONTIN) 300 MG capsule Take 300 mg by mouth 2 (two) times daily.    . Multiple Vitamins-Minerals (MULTIVITAMIN WITH MINERALS) tablet Take 1 tablet by mouth daily.    . phenytoin (DILANTIN) 100 MG ER capsule Take 1 capsule (100 mg total)  by mouth 3 (three) times daily. 90 capsule 2  . traMADol (ULTRAM) 50 MG tablet Take 1 tablet (50 mg total) by mouth every 6 (six) hours as needed for moderate pain. 15 tablet 0  . diclofenac (VOLTAREN) 75 MG EC tablet Take 1 tablet (75 mg total) by mouth 2 (two) times daily. (Patient not taking: Reported on 10/04/2016) 60 tablet 0  . diclofenac sodium (VOLTAREN) 1 % GEL Apply 2 g topically 4 (four) times daily. (Patient not taking: Reported on 11/12/2017) 1 Tube 0  . meloxicam (MOBIC) 15 MG tablet Take 1 tablet (15 mg total) by mouth daily. (Patient not taking: Reported on 11/12/2017) 30 tablet 0  . methocarbamol (ROBAXIN) 500 MG tablet Take 1 tablet (500 mg total) by mouth 4 (four) times daily. (Patient not taking: Reported on 11/12/2017) 16 tablet 0  . metoCLOPramide (REGLAN) 10 MG tablet Take 1 tablet (10 mg total) by mouth every 6 (six) hours as needed. (Patient not taking: Reported on 10/04/2016) 30 tablet 0  . mirtazapine (REMERON) 15 MG tablet Take 1 tablet (15 mg total) at bedtime by mouth. (Patient not taking: Reported on 11/12/2017) 30 tablet 0  . omeprazole (PRILOSEC OTC) 20 MG tablet Take 1 tablet (20 mg total) by mouth daily. (Patient not taking: Reported on 10/04/2016) 28 tablet 1  . ondansetron (ZOFRAN ODT) 4 MG disintegrating tablet Allow 1-2 tablets to dissolve in your mouth every 8 hours as needed for nausea/vomiting (Patient not taking: Reported on 10/04/2016) 30 tablet 0  . sucralfate  (CARAFATE) 1 g tablet Take 1 tablet (1 g total) by mouth 4 (four) times daily as needed (for abdominal discomfort, nausea, and/or vomiting). (Patient not taking: Reported on 10/04/2016) 30 tablet 1    Musculoskeletal: Strength & Muscle Tone: within normal limits Gait & Station: normal Patient leans: N/A  Psychiatric Specialty Exam: Physical Exam  Nursing note and vitals reviewed. Constitutional: She appears well-developed.  HENT:  Head: Normocephalic and atraumatic.  Eyes: Pupils are equal, round, and reactive to light. Conjunctivae are normal.  Neck: Normal range of motion.  Cardiovascular: Normal heart sounds.  Respiratory: Effort normal. No respiratory distress.  GI: Soft.  Musculoskeletal: Normal range of motion.  Neurological: She is alert.  Skin: Skin is warm and dry.  Psychiatric: Her affect is blunt. Her speech is delayed and tangential. She is slowed and withdrawn. Thought content is not paranoid. Cognition and memory are impaired. She expresses impulsivity. She expresses no homicidal and no suicidal ideation. She exhibits abnormal recent memory.    Review of Systems  Constitutional: Negative.   HENT: Negative.   Eyes: Negative.   Respiratory: Negative.   Cardiovascular: Negative.   Gastrointestinal: Negative.   Musculoskeletal: Negative.   Skin: Negative.   Neurological: Negative.   Psychiatric/Behavioral: Positive for memory loss. Negative for depression, hallucinations, substance abuse and suicidal ideas. The patient is not nervous/anxious and does not have insomnia.     Blood pressure (!) 123/93, pulse 78, temperature 97.7 F (36.5 C), temperature source Oral, resp. rate (!) 23, height '5\' 2"'  (1.575 m), weight 59 kg (130 lb), last menstrual period 12/17/2007, SpO2 93 %.Body mass index is 23.78 kg/m.  General Appearance: Disheveled  Eye Contact:  Minimal  Speech:  Slow  Volume:  Decreased  Mood:  Dysphoric  Affect:  Constricted  Thought Process:  Goal Directed   Orientation:  Full (Time, Place, and Person)  Thought Content:  WDL  Suicidal Thoughts:  No  Homicidal Thoughts:  No  Memory:  Immediate;   Fair Recent;   Fair Remote;   Fair  Judgement:  Impaired  Insight:  Shallow  Psychomotor Activity:  Decreased  Concentration:  Concentration: Fair  Recall:  [employer] of Knowledge:  Fair  Language:  Fair  Akathisia:  No  Handed:  Right  AIMS (if indicated):     Assets:  Desire for Improvement Housing Social Support  ADL's:  Impaired  Cognition:  Impaired,  Mild  Sleep:        Treatment Plan Summary: Plan 57 year old woman who had a documented seizure.  Underlying etiology unclear.  Claims to be fully compliant with medicine and her levels seem appropriate.  Patient has marijuana in her drug screen but the only other drug she is positive for is benzodiazepines which could be consistent with the medicine given for her seizure.  Patient is still a little bit confused which could be postictal or from medicines.  No evidence however of acute dangerousness.  No evidence of requirement for inpatient hospitalization psychiatrically.  Patient encouraged to continue with her regular medication management and be very compliant with recommendations.  No change to treatment plan.  Case reviewed with emergency room physician.  She can be discharged from the ER at their discretion.  Disposition: No evidence of imminent risk to self or others at present.   Patient does not meet criteria for psychiatric inpatient admission. Supportive therapy provided about ongoing stressors.  Alethia Berthold, MD 11/13/2017 5:09 PM

## 2017-11-13 NOTE — BH Assessment (Addendum)
Assessment Note  Nicole Chandler is an 57 y.o. female. Who  has had seizures or seizure-like activity, restarted on Dilantin after presenting to the ER for similar complaints on yesterday.Pt awakened to voice and was agreeable to complete assessment. Pt was difficult to understand much of the time and timelines were inconsistent. Pt unable to provide clear history. Pt psychomotor agitation and continued to jerk and roll aroung the bed. She had dry heaves several times.  The follow is what this writer could ascertain from the pt. Pt denied and previous MH history or previous overdoses although she has a documented history of Bipolar Disorder and a presentation in 2017 for intentional OD. Pt states " I don't know when asked if she had taken an excessive amount of any medications on today. Pt denied the use of any mood altering substances although pts family report  she may be on Suboxone.There is no indication pt is currently responding to internal stimuli or experiencing delusional thought content. Pt. denies any suicidal ideation, plan or intent. Pt. denies the presence of any auditory or visual hallucinations at this time. Patient denies any other medical complaints.Pt presenting with impaired insight, judgement and impulse control, further evaluation is recommended.      Past Medical History:  Past Medical History:  Diagnosis Date  . Arthritis   . Bipolar 1 disorder (HCC)   . Cancer (HCC)   . Chronic back pain unk  . Depression   . Panic attacks   . Seizures (HCC)     Past Surgical History:  Procedure Laterality Date  . ABDOMINAL SURGERY    . EYE SURGERY      Family History:  Family History  Problem Relation Age of Onset  . Alzheimer's disease Mother   . AAA (abdominal aortic aneurysm) Father   . Breast cancer Sister 68    Social History:  reports that she has been smoking cigarettes.  She has been smoking about 1.00 pack per day. She has never used smokeless tobacco. She reports  that she has current or past drug history. Drugs: Marijuana and Cocaine. She reports that she does not drink alcohol.  Additional Social History:  Alcohol / Drug Use Pain Medications: SEE MAR Prescriptions: SEE MAR Over the Counter: SEE MAR History of alcohol / drug use?: (Pt denied )  CIWA: CIWA-Ar BP: 125/88 Pulse Rate: 69 COWS:    Allergies: No Known Allergies  Home Medications:  (Not in a hospital admission)  OB/GYN Status:  Patient's last menstrual period was 12/17/2007 (approximate).  General Assessment Data Location of Assessment: [facility] ED TTS Assessment: In system Is this a Tele or Face-to-Face Assessment?: Face-to-Face Is this an Initial Assessment or a Re-assessment for this encounter?: Initial Assessment Marital status: Single Is patient pregnant?: No Pregnancy Status: No Living Arrangements: Spouse/significant other Can pt return to current living arrangement?: Yes Admission Status: Voluntary Is patient capable of signing voluntary admission?: Yes Referral Source: Self/Family/Friend Insurance type: none  Medical Screening Exam [facility] Walk-in ONLY) Medical Exam completed: Yes  Crisis Care Plan Living Arrangements: Spouse/significant other Legal Guardian: Other: Name of Psychiatrist: none  Name of Therapist: none  Education Status Is patient currently in school?: No Is the patient employed, unemployed or receiving disability?: (unknown )  Risk to self with the past 6 months Suicidal Ideation: No-Not Currently/Within Last 6 Months Has patient been a risk to self within the past 6 months prior to admission? : No Suicidal Intent: No Has patient had any suicidal intent within  the past 6 months prior to admission? : No Is patient at risk for suicide?: No Suicidal Plan?: No-Not Currently/Within Last 6 Months Has patient had any suicidal plan within the past 6 months prior to admission? : No Access to Means: No What has been your use of drugs/alcohol within  the last 12 months?: no Previous Attempts/Gestures: No How many times?: 0 Other Self Harm Risks: no Triggers for Past Attempts: None known Intentional Self Injurious Behavior: None Family Suicide History: No Recent stressful life event(s): Other (Comment)(unknown ) Persecutory voices/beliefs?: No Depression: (UTA) Depression Symptoms: (Unknown ) Substance abuse history and/or treatment for substance abuse?: Yes Suicide prevention information given to non-admitted patients: Not applicable  Risk to Others within the past 6 months Homicidal Ideation: No Does patient have any lifetime risk of violence toward others beyond the six months prior to admission? : No Thoughts of Harm to Others: No Current Homicidal Intent: No Current Homicidal Plan: No Access to Homicidal Means: No Identified Victim: n/a History of harm to others?: No Assessment of Violence: None Noted Violent Behavior Description: none Does patient have access to weapons?: No Criminal Charges Pending?: No Does patient have a court date: No Is patient on probation?: No  Psychosis Hallucinations: None noted Delusions: None noted  Mental Status Report Appearance/Hygiene: In scrubs Eye Contact: Poor Motor Activity: Agitation, Restlessness, Mannerisms Speech: Slurred, Logical/coherent Level of Consciousness: Crying, Irritable, Restless(unresponsive a times ) Mood: Anxious Affect: Anxious, Sad Anxiety Level: Severe Thought Processes: Coherent Judgement: Partial Orientation: Place, Person, Time, Situation Obsessive Compulsive Thoughts/Behaviors: None  Cognitive Functioning Concentration: Poor Memory: Remote Intact, Recent Intact Is patient IDD: No Is patient DD?: No Insight: Poor Impulse Control: Poor Appetite: Fair Have you had any weight changes? : (UTA) Sleep: Unable to Assess Vegetative Symptoms: Unable to Assess  ADLScreening [facility] Assessment Services) Patient's cognitive ability adequate to safely  complete daily activities?: Yes Patient able to express need for assistance with ADLs?: Yes Independently performs ADLs?: Yes (appropriate for developmental age)  Prior Inpatient Therapy Prior Inpatient Therapy: No Prior Therapy Dates: none Prior Therapy Facilty/Provider(s): none Reason for Treatment: none   Prior Outpatient Therapy Prior Outpatient Therapy: No Does patient have an ACCT team?: No Does patient have Intensive In-House Services?  : No Does patient have [facility] services? : No Does patient have P4CC services?: No  ADL Screening (condition at time of admission) Patient's cognitive ability adequate to safely complete daily activities?: Yes Patient able to express need for assistance with ADLs?: Yes Independently performs ADLs?: Yes (appropriate for developmental age)       Abuse/Neglect Assessment (Assessment to be complete while patient is alone) Abuse/Neglect Assessment Can Be Completed: Unable to assess, patient is non-responsive or altered mental status Values / Beliefs Cultural Requests During Hospitalization: None Consults Spiritual Care Consult Needed: No Social Work Consult Needed: No      Additional Information 1:1 In Past 12 Months?: No CIRT Risk: No Elopement Risk: No Does patient have medical clearance?: Yes     Disposition:  Disposition Initial Assessment Completed for this Encounter: Yes Patient referred to: Other (Comment)(AM re-evaluation )  On Site Evaluation by:   Reviewed with Physician:    Asa SaunasShawanna N Nigil Braman 11/13/2017 5:54 AM

## 2017-11-13 NOTE — ED Notes (Signed)
Vol patient moved to BHU 

## 2017-11-20 ENCOUNTER — Emergency Department
Admission: EM | Admit: 2017-11-20 | Discharge: 2017-11-20 | Disposition: A | Discharge status: Home / Self Care | Payer: Self-pay | Attending: Emergency Medicine | Admitting: Emergency Medicine

## 2017-11-20 ENCOUNTER — Encounter: Payer: Self-pay | Admitting: Emergency Medicine

## 2017-11-20 ENCOUNTER — Emergency Department: Payer: Self-pay

## 2017-11-20 DIAGNOSIS — Y929 Unspecified place or not applicable: Secondary | ICD-10-CM | POA: Insufficient documentation

## 2017-11-20 DIAGNOSIS — F1210 Cannabis abuse, uncomplicated: Secondary | ICD-10-CM | POA: Insufficient documentation

## 2017-11-20 DIAGNOSIS — Y939 Activity, unspecified: Secondary | ICD-10-CM | POA: Insufficient documentation

## 2017-11-20 DIAGNOSIS — R0789 Other chest pain: Secondary | ICD-10-CM | POA: Insufficient documentation

## 2017-11-20 DIAGNOSIS — S20211A Contusion of right front wall of thorax, initial encounter: Secondary | ICD-10-CM | POA: Insufficient documentation

## 2017-11-20 DIAGNOSIS — F17210 Nicotine dependence, cigarettes, uncomplicated: Secondary | ICD-10-CM | POA: Insufficient documentation

## 2017-11-20 DIAGNOSIS — W19XXXD Unspecified fall, subsequent encounter: Secondary | ICD-10-CM | POA: Insufficient documentation

## 2017-11-20 DIAGNOSIS — Y998 Other external cause status: Secondary | ICD-10-CM | POA: Insufficient documentation

## 2017-11-20 MED ORDER — CYCLOBENZAPRINE HCL 5 MG PO TABS: 5.0000 mg | ORAL_TABLET | Freq: Three times a day (TID) | ORAL | 0 refills | Status: DC | PRN

## 2017-11-20 MED ORDER — BENZONATATE 100 MG PO CAPS: ORAL_CAPSULE | ORAL | 0 refills | Status: DC

## 2017-11-20 NOTE — Discharge Instructions (Addendum)
Your exam is consistent with chest wall contusion. There is no x-ray evidence of rib fractures or pneumonia. You have some evidence of bronchitis on the x-ray. Take the cough medicine as directed. Apply ice or moist compresses to the ribs for comfort.

## 2017-11-20 NOTE — ED Triage Notes (Signed)
Patient presents to the ED with right sided rib pain.  Area is tender on palpation and painful when patient coughs.  Patient states, "I had a seizure about a week ago and my ribs have been hurting since then."

## 2017-11-20 NOTE — ED Notes (Signed)
Provider at bedside

## 2017-11-20 NOTE — ED Notes (Signed)
ED Provider at bedside. 

## 2017-11-20 NOTE — ED Provider Notes (Signed)
[facility] Emergency Department Provider Note ____________________________________________  Time seen: 1140  I have reviewed the triage vital signs and the nursing notes.  HISTORY  Chief Complaint  Chest Pain (Right ribs, tender)  HPI Nicole Chandler is a 57 y.o. female presents to the ED for evaluation of right-sided rib pain.  Patient describes pain to the lateral and posterior aspects of her right ribs that is been present for about a week and a half.  She describes onset after she had a mechanical fall secondary to seizure activity.  She was evaluated here in the ED following her seizures and had a short admission.  She denies any interim falls.  She describes currently she has some cough and cold symptoms which have worsened her rib pain.  She denies any interim fevers, chills, or sweats, she also denies any hemoptysis, syncope, or chest pain.  Past Medical History:  Diagnosis Date  . Back pain   . Depression   . Sciatica   . Seizures (HCC)     There are no active problems to display for this patient.   Past Surgical History:  Procedure Laterality Date  . EYE SURGERY      Prior to Admission medications   Medication Sig Start Date End Date Taking? Authorizing Provider  baclofen (LIORESAL) 10 MG tablet Take 1 tablet (10 mg total) by mouth 3 (three) times daily. 10/15/16   Chinita Pesterriplett, Cari B, FNP  benzonatate (TESSALON PERLES) 100 MG capsule Take 1-2 tabs TID prn cough 11/20/17   Erisha Paugh, Charlesetta IvoryJenise V Bacon, PA-C  cyclobenzaprine (FLEXERIL) 5 MG tablet Take 1 tablet (5 mg total) by mouth 3 (three) times daily as needed for muscle spasms. 11/20/17   Simisola Sandles, Charlesetta IvoryJenise V Bacon, PA-C  gabapentin (NEURONTIN) 300 MG capsule Take 300 mg by mouth 3 (three) times daily.    [provider]  ibuprofen (ADVIL,MOTRIN) 800 MG tablet Take 1 tablet (800 mg total) by mouth every 8 (eight) hours as needed for headache (with food). 12/16/16   Rockne MenghiniNorman, Anne-Caroline, MD     Allergies Patient has no known allergies.  No family history on file.  Social History Social History   Tobacco Use  . Smoking status: Current Every Day Smoker    Packs/day: 0.50    Types: Cigarettes  . Smokeless tobacco: Never Used  Substance Use Topics  . Alcohol use: No  . Drug use: Yes    Types: Marijuana    Review of Systems  Constitutional: Negative for fever. Eyes: Negative for visual changes. ENT: Negative for sore throat. Cardiovascular: Negative for chest pain. Respiratory: Negative for shortness of breath.  Nonproductive cough. Gastrointestinal: Negative for abdominal pain, vomiting and diarrhea. Genitourinary: Negative for dysuria. Musculoskeletal: Negative for back pain.  Right chest wall pain as above. Skin: Negative for rash. Neurological: Negative for headaches, focal weakness or numbness. ____________________________________________  PHYSICAL EXAM:  VITAL SIGNS: ED Triage Vitals  Enc Vitals Group     BP 11/20/17 1056 134/81     Pulse Rate 11/20/17 1056 89     Resp 11/20/17 1056 18     Temp 11/20/17 1056 98 F (36.7 C)     Temp Source 11/20/17 1056 Oral     SpO2 11/20/17 1056 94 %     Weight 11/20/17 1052 140 lb (63.5 kg)     Height 11/20/17 1052 5\' 3"  (1.6 m)     Head Circumference --      Peak Flow --  Pain Score 11/20/17 1052 8     Pain Loc --      Pain Edu? --      Excl. in GC? --     Constitutional: Alert and oriented. Well appearing and in no distress.  Head: Normocephalic and atraumatic. Eyes: Conjunctivae are normal. Normal extraocular movements Neck: Supple. No thyromegaly. Cardiovascular: Normal rate, regular rhythm. Normal distal pulses. Respiratory: Normal respiratory effort. No wheezes/rales/rhonchi.  No chest wall deformity is appreciated.  Patient is mildly tender to palpation to the posterior lateral right rib cage. Gastrointestinal: Soft and nontender. No distention. Musculoskeletal: Nontender with normal range of  motion in all extremities.  Neurologic:  Normal gait without ataxia. Normal speech and language. No gross focal neurologic deficits are appreciated. Skin:  Skin is warm, dry and intact. No rash noted. Psychiatric: Mood and affect are normal.  Patient is intermittently laughing and sobbing without tears during the exam. ____________________________________________   RADIOLOGY  Right Rib Detail w/ CXR Negative  ____________________________________________  INITIAL IMPRESSION / ASSESSMENT AND PLAN / ED COURSE  Patient with ED evaluation of a 1/2-week complaint of continued right chest wall and rib pain following a mechanical fall.  Her x-rays negative for any acute fracture dislocation.  Does note some bronchitic changes.  Her symptoms are aggravated by her intermittent cough.  She will be discharged with a prescription for Tessalon Perles and Flexeril to dose as directed.  She will follow with her primary provider or return to the ED as needed.  I reviewed the patient's prescription history over the last 12 months in the multi-state controlled substances database(s) that includes [place], [place], [place], [place], [place], [place], [place], [place], [place], [place], [place], [place], [place], and [place].  Results were notable for her monthly Subaxone prescriptions.  ____________________________________________  FINAL CLINICAL IMPRESSION(S) / ED DIAGNOSES  Final diagnoses:  Chest wall pain  Rib contusion, right, initial encounter      Lissa Hoard, PA-C 11/20/17 1323    Governor Rooks, MD 11/20/17 1520

## 2017-11-20 NOTE — ED Notes (Signed)
See triage note  States she had a sz about 1 week ago  conts to have right lateral rib area

## 2017-11-25 ENCOUNTER — Encounter: Payer: Self-pay | Admitting: Emergency Medicine

## 2017-12-01 ENCOUNTER — Emergency Department
Admission: EM | Admit: 2017-12-01 | Discharge: 2017-12-01 | Discharge status: Against Medical Advice | Payer: Self-pay | Attending: Emergency Medicine | Admitting: Emergency Medicine

## 2017-12-01 ENCOUNTER — Emergency Department: Payer: Self-pay

## 2017-12-01 ENCOUNTER — Encounter: Payer: Self-pay | Admitting: Emergency Medicine

## 2017-12-01 ENCOUNTER — Other Ambulatory Visit: Payer: Self-pay

## 2017-12-01 DIAGNOSIS — M940 Chondrocostal junction syndrome [Tietze]: Secondary | ICD-10-CM | POA: Insufficient documentation

## 2017-12-01 DIAGNOSIS — Z859 Personal history of malignant neoplasm, unspecified: Secondary | ICD-10-CM | POA: Insufficient documentation

## 2017-12-01 DIAGNOSIS — Z79899 Other long term (current) drug therapy: Secondary | ICD-10-CM | POA: Insufficient documentation

## 2017-12-01 DIAGNOSIS — F17210 Nicotine dependence, cigarettes, uncomplicated: Secondary | ICD-10-CM | POA: Insufficient documentation

## 2017-12-01 MED ORDER — NABUMETONE 500 MG PO TABS: 500.0000 mg | ORAL_TABLET | Freq: Every day | ORAL | 0 refills | Status: DC

## 2017-12-01 MED ORDER — CYCLOBENZAPRINE HCL 10 MG PO TABS
10.0000 mg | ORAL_TABLET | Freq: Once | ORAL | Status: AC
  Administered 2017-12-01: 10 mg via ORAL
  Filled 2017-12-01: qty 1

## 2017-12-01 MED ORDER — KETOROLAC TROMETHAMINE 60 MG/2ML IM SOLN
60.0000 mg | Freq: Once | INTRAMUSCULAR | Status: AC
  Administered 2017-12-01: 60 mg via INTRAMUSCULAR
  Filled 2017-12-01: qty 2

## 2017-12-01 NOTE — ED Notes (Signed)
See triage note  States she is still having pain to rib area   S/p fall  Fall occurred about 1 month ago  Denies any new injury

## 2017-12-01 NOTE — ED Notes (Signed)
Family came to room  Provider in with pt.  Explained discharge instructions and rx

## 2017-12-01 NOTE — ED Notes (Signed)
First Nurse Note:  Patient is complaining of rib pain post fall approx. 1 month ago.  Patient is in no obvious distress at this time.

## 2017-12-01 NOTE — ED Provider Notes (Signed)
[facility] Emergency Department Provider Note   ____________________________________________   First MD Initiated Contact with Patient 12/01/17 1325     (approximate)  I have reviewed the triage vital signs and the nursing notes.   HISTORY  Chief Complaint Rib Injury    HPI Nicole Chandler is a 57 y.o. female patient complain right rib pain for 1 month.  Incident occurred status post fall.  Patient was seen at this facility and x-rays were unremarkable.  Patient that she had a coughing episode last night and felt a "pop in her ribs last night.  Patient states pain increased each nonproductive cough episode.  Patient rates pain as 8/10.  Patient described the pain is "sharp".  No palliative measure for complaint.  Past Medical History:  Diagnosis Date  . Arthritis   . Back pain   . Bipolar 1 disorder (HCC)   . Cancer (HCC)   . Chronic back pain unk  . Depression   . Panic attacks   . Sciatica   . Seizures [facility])     Patient Active Problem List   Diagnosis Date Noted  . Postictal state (HCC) 11/13/2017  . 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 Seizure (HCC) 03/24/2016  . Substance induced mood disorder (HCC) 03/05/2016  . Opioid use disorder, severe, dependence (HCC) 02/25/2016  . Cocaine use disorder, moderate, dependence (HCC) 02/25/2016  . Tobacco use disorder 02/25/2016  . Opioid-induced mood disorder (HCC) 02/25/2016  . Cannabis use disorder, moderate, dependence (HCC) 02/25/2016    Past Surgical History:  Procedure Laterality Date  . ABDOMINAL SURGERY    . EYE SURGERY      Prior to Admission medications   Medication Sig Start Date End Date Taking? Authorizing Provider  albuterol (PROVENTIL HFA;VENTOLIN HFA) 108 (90 Base) MCG/ACT inhaler Inhale 2 puffs into the lungs every 6 (six) hours as needed. 10/04/16   Schaevitz, Myra Rude, MD  baclofen (LIORESAL) 10 MG tablet Take 1 tablet (10 mg  total) by mouth 3 (three) times daily. 10/15/16   [name], FNP  benzonatate (TESSALON PERLES) 100 MG capsule Take 1-2 tabs TID prn cough 11/20/17   Menshew, Charlesetta Ivory, PA-C  cyclobenzaprine (FLEXERIL) 5 MG tablet Take 1 tablet (5 mg total) by mouth 3 (three) times daily as needed for muscle spasms. 11/20/17   Menshew, Charlesetta Ivory, PA-C  diclofenac (VOLTAREN) 75 MG EC tablet Take 1 tablet (75 mg total) by mouth 2 (two) times daily. Patient not taking: Reported on 10/04/2016 06/28/16   Evangeline Dakin, PA-C  diclofenac sodium (VOLTAREN) 1 % GEL Apply 2 g topically 4 (four) times daily. Patient not taking: Reported on 11/12/2017 04/25/17   Irean Hong, MD  diphenhydrAMINE (BENADRYL) 25 MG tablet Take 25 mg by mouth every 6 (six) hours as needed for allergies.     [provider]  gabapentin (NEURONTIN) 300 MG capsule Take 300 mg by mouth 2 (two) times daily.    [provider]  gabapentin (NEURONTIN) 300 MG capsule Take 300 mg by mouth 3 (three) times daily.    [provider]  ibuprofen (ADVIL,MOTRIN) 800 MG tablet Take 1 tablet (800 mg total) by mouth every 8 (eight) hours as needed for headache (with food). 12/16/16   Rockne Menghini, MD  meloxicam (MOBIC) 15 MG tablet Take 1 tablet (15 mg total) by mouth daily. Patient not taking: Reported on 11/12/2017 04/09/17   Cuthriell, Delorise Royals, PA-C  methocarbamol (ROBAXIN) 500 MG tablet Take 1 tablet (500 mg total) by mouth 4 (four) times daily. Patient not taking: Reported on 11/12/2017 04/09/17   Cuthriell, Delorise Royals, PA-C  metoCLOPramide (REGLAN) 10 MG tablet Take 1 tablet (10 mg total) by mouth every 6 (six) hours as needed. Patient not taking: Reported on 10/04/2016 07/03/16   Sharman Cheek, MD  mirtazapine (REMERON) 15 MG tablet Take 1 tablet (15 mg total) at bedtime by mouth. Patient not taking: Reported on 11/12/2017 07/08/17 08/07/17  Clapacs, Jackquline Denmark, MD  Multiple Vitamins-Minerals (MULTIVITAMIN WITH  MINERALS) tablet Take 1 tablet by mouth daily.    [provider]  nabumetone (RELAFEN) 500 MG tablet Take 1 tablet (500 mg total) by mouth daily. 12/01/17   Joni Reining, PA-C  omeprazole (PRILOSEC OTC) 20 MG tablet Take 1 tablet (20 mg total) by mouth daily. Patient not taking: Reported on 10/04/2016 06/30/16 06/30/17  Loleta Rose, MD  ondansetron (ZOFRAN ODT) 4 MG disintegrating tablet Allow 1-2 tablets to dissolve in your mouth every 8 hours as needed for nausea/vomiting Patient not taking: Reported on 10/04/2016 06/30/16   Loleta Rose, MD  phenytoin (DILANTIN) 100 MG ER capsule Take 1 capsule (100 mg total) by mouth 3 (three) times daily. 11/12/17 02/10/18  Dionne Bucy, MD  sucralfate (CARAFATE) 1 g tablet Take 1 tablet (1 g total) by mouth 4 (four) times daily as needed (for abdominal discomfort, nausea, and/or vomiting). Patient not taking: Reported on 10/04/2016 06/30/16   Loleta Rose, MD  traMADol (ULTRAM) 50 MG tablet Take 1 tablet (50 mg total) by mouth every 6 (six) hours as needed for moderate pain. 11/12/17   Dionne Bucy, MD    Allergies Patient has no known allergies.  Family History  Problem Relation Age of Onset  . Alzheimer's disease Mother   . AAA (abdominal aortic aneurysm) Father   . Breast cancer Sister 73    Social History Social History   Tobacco Use  . Smoking status: Current Every Day Smoker    Packs/day: 0.50    Types: Cigarettes    Last attempt to quit: 08/27/2014    Years since quitting: 3.2  . Smokeless tobacco: Never Used  Substance Use Topics  . Alcohol use: No  . Drug use: Yes    Types: Cocaine, Marijuana    Review of Systems Constitutional: No fever/chills Eyes: No visual changes. ENT: No sore throat. Cardiovascular: Denies chest pain. Respiratory: Denies shortness of breath. Gastrointestinal: No abdominal pain.  No nausea, no vomiting.  No diarrhea.  No constipation. Genitourinary: Negative for  dysuria. Musculoskeletal: Negative for back pain. Skin: Negative for rash. Neurological: Negative for headaches, focal weakness or numbness.   ____________________________________________   PHYSICAL EXAM:  VITAL SIGNS: ED Triage Vitals  Enc Vitals Group     BP 12/01/17 1239 (!) 145/86     Pulse Rate 12/01/17 1239 89     Resp 12/01/17 1239 18     Temp 12/01/17 1239 98.5 F (36.9 C)     Temp Source 12/01/17 1239 Oral     SpO2 12/01/17 1239 97 %     Weight 12/01/17 1255 140 lb (63.5 kg)     Height 12/01/17 1255 5\' 3"  (1.6 m)     Head Circumference --      Peak Flow --      Pain Score 12/01/17 1254 9     Pain Loc --      Pain Edu? --      Excl. in  GC? --    Constitutional: Alert and oriented. Well appearing and in no acute distress. Neck: No stridor.  No cervical spine tenderness to palpation. Hematological/Lymphatic/Immunilogical: No cervical lymphadenopathy. Cardiovascular: Normal rate, regular rhythm. Grossly normal heart sounds.  Good peripheral circulation. Respiratory: Normal respiratory effort.  No retractions. Lungs CTAB. Gastrointestinal: Soft and nontender. No distention. No abdominal bruits. No CVA tenderness. Musculoskeletal: No chest wall deformity.  No edema or ecchymosis.  No abrasions. Neurologic:  Normal speech and language. No gross focal neurologic deficits are appreciated. No gait instability. Skin:  Skin is warm, dry and intact. No rash noted. Psychiatric: Mood and affect are normal. Speech and behavior are normal.  ____________________________________________   LABS (all labs ordered are listed, but only abnormal results are displayed)  Labs Reviewed - No data to display ____________________________________________  EKG   ____________________________________________  RADIOLOGY  No acute findings on chest x-ray today.  Official radiology report(s): Dg Chest 2 View  Result Date: 12/01/2017 CLINICAL DATA:  Cough and chest pain EXAM: CHEST - 2  VIEW COMPARISON:  November 20, 2017 FINDINGS: No edema or consolidation. Heart size and pulmonary vascularity are normal. No adenopathy. Incidental note is made of a cervical rib on the left. IMPRESSION: No edema or consolidation. Electronically Signed   By: Bretta BangWilliam  Woodruff III M.D.   On: 12/01/2017 14:22    ____________________________________________   PROCEDURES  Procedure(s) performed: None  Procedures  Critical Care performed: No  ____________________________________________   INITIAL IMPRESSION / ASSESSMENT AND PLAN / ED COURSE  As part of my medical decision making, I reviewed the following data within the electronic MEDICAL RECORD NUMBER    Continue right lateral chest wall pain no objective findings.  Patient given discharge care instruction.  Patient prescribed Relafen and advised follow-up PCP.      ____________________________________________   FINAL CLINICAL IMPRESSION(S) / ED DIAGNOSES  Final diagnoses:  Costochondritis     ED Discharge Orders        Ordered    nabumetone (RELAFEN) 500 MG tablet  Daily     12/01/17 1455       Note:  This document was prepared using Dragon voice recognition software and may include unintentional dictation errors.    Joni ReiningSmith, Zykee Avakian K, PA-C 12/01/17 1504    Emily FilbertWilliams, Jonathan E, MD 12/01/17 [phone]

## 2017-12-01 NOTE — ED Triage Notes (Signed)
Says she has been having pain in right ribs since fall last month.  Says she also has cough and cold and felt a pop in her ribs last night.

## 2017-12-01 NOTE — ED Notes (Signed)
Went in to discharge pr  Not in room

## 2018-01-14 ENCOUNTER — Emergency Department
Admission: EM | Admit: 2018-01-14 | Discharge: 2018-01-14 | Disposition: A | Discharge status: Home / Self Care | Payer: Medicaid Other | Attending: Emergency Medicine | Admitting: Emergency Medicine

## 2018-01-14 ENCOUNTER — Other Ambulatory Visit: Payer: Self-pay

## 2018-01-14 ENCOUNTER — Emergency Department: Payer: Medicaid Other

## 2018-01-14 DIAGNOSIS — M5431 Sciatica, right side: Secondary | ICD-10-CM

## 2018-01-14 DIAGNOSIS — F17210 Nicotine dependence, cigarettes, uncomplicated: Secondary | ICD-10-CM | POA: Diagnosis not present

## 2018-01-14 DIAGNOSIS — Z79899 Other long term (current) drug therapy: Secondary | ICD-10-CM | POA: Insufficient documentation

## 2018-01-14 DIAGNOSIS — R319 Hematuria, unspecified: Secondary | ICD-10-CM | POA: Diagnosis not present

## 2018-01-14 DIAGNOSIS — M545 Low back pain: Secondary | ICD-10-CM | POA: Diagnosis present

## 2018-01-14 LAB — URINALYSIS, COMPLETE (UACMP) WITH MICROSCOPIC
Bacteria, UA: NONE SEEN
Bilirubin Urine: NEGATIVE
Glucose, UA: NEGATIVE mg/dL
Ketones, ur: NEGATIVE mg/dL
Leukocytes, UA: NEGATIVE
Nitrite: NEGATIVE
PH: 6 (ref 5.0–8.0)
Protein, ur: NEGATIVE mg/dL
Specific Gravity, Urine: 1.004 — ABNORMAL LOW (ref 1.005–1.030)

## 2018-01-14 MED ORDER — KETOROLAC TROMETHAMINE 10 MG PO TABS: 10.0000 mg | ORAL_TABLET | Freq: Four times a day (QID) | ORAL | 0 refills | Status: DC | PRN

## 2018-01-14 MED ORDER — PREDNISONE 10 MG PO TABS: ORAL_TABLET | ORAL | 0 refills | Status: DC

## 2018-01-14 MED ORDER — KETOROLAC TROMETHAMINE 30 MG/ML IJ SOLN
30.0000 mg | Freq: Once | INTRAMUSCULAR | Status: AC
  Administered 2018-01-14: 30 mg via INTRAMUSCULAR
  Filled 2018-01-14: qty 1

## 2018-01-14 MED ORDER — OXYCODONE-ACETAMINOPHEN 5-325 MG PO TABS
1.0000 | ORAL_TABLET | Freq: Once | ORAL | Status: AC
  Administered 2018-01-14: 1 via ORAL
  Filled 2018-01-14: qty 1

## 2018-01-14 NOTE — Discharge Instructions (Signed)
Keep your appointment with Redmond Baseman tomorrow
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
## 2018-03-01 ENCOUNTER — Other Ambulatory Visit: Payer: Self-pay

## 2018-03-01 ENCOUNTER — Emergency Department
Admission: EM | Admit: 2018-03-01 | Discharge: 2018-03-02 | Disposition: A | Discharge status: Home / Self Care | Payer: Medicaid Other | Attending: Emergency Medicine | Admitting: Emergency Medicine

## 2018-03-01 DIAGNOSIS — F17210 Nicotine dependence, cigarettes, uncomplicated: Secondary | ICD-10-CM | POA: Diagnosis not present

## 2018-03-01 DIAGNOSIS — R569 Unspecified convulsions: Secondary | ICD-10-CM | POA: Diagnosis present

## 2018-03-01 DIAGNOSIS — Z79899 Other long term (current) drug therapy: Secondary | ICD-10-CM | POA: Insufficient documentation

## 2018-03-01 MED ORDER — LEVETIRACETAM 500 MG PO TABS
1000.0000 mg | ORAL_TABLET | Freq: Once | ORAL | Status: AC
  Administered 2018-03-01: 1000 mg via ORAL
  Filled 2018-03-01: qty 2

## 2018-03-01 MED ORDER — LEVETIRACETAM 500 MG PO TABS: 500.0000 mg | ORAL_TABLET | Freq: Two times a day (BID) | ORAL | 0 refills | Status: DC

## 2018-03-01 NOTE — ED Provider Notes (Signed)
[facility] Emergency Department Provider Note  ____________________________________________   First MD Initiated Contact with Patient 03/01/18 2336     (approximate)  I have reviewed the triage vital signs and the nursing notes.   HISTORY  Chief Complaint Seizures    HPI Nicole Chandler is a 57 y.o. female who comes to the emergency department via EMS after sustaining a generalized tonic-clonic seizure at home.   She has a long-standing history of seizure disorder and says she has been noncompliant with her unknown medications for multiple months.  She has not seen her neurologist in "a long time".  When EMS arrived the patient was postictal had bit her tongue and was incontinent to urine.  She had unremarkable vital signs and a normal blood sugar and woke up appropriately in route.  Was given no medications.  The patient does report some nausea but no vomiting.  Her seizure today came on suddenly lasted a short amount of time and resolved on its own.  She has had no recent illness.  She does have a long-standing history of drug abuse and marijuana abuse and when asked about it today she seems evasive.  Her symptoms were severe and short-lived.  Nothing in particular seemed to make them better or worse.   Past Medical History:  Diagnosis Date  . Arthritis   . Back pain   . Bipolar 1 disorder (HCC)   . Cancer (HCC)   . Chronic back pain unk  . Depression   . Panic attacks   . Sciatica   . Seizures [facility])     Patient Active Problem List   Diagnosis Date Noted  . Postictal state (HCC) 11/13/2017  . 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 Seizure (HCC) 03/24/2016  . Substance induced mood disorder (HCC) 03/05/2016  . Opioid use disorder, severe, dependence (HCC) 02/25/2016  . Cocaine use disorder, moderate, dependence (HCC) 02/25/2016  . Tobacco use disorder 02/25/2016  . Opioid-induced mood disorder (HCC)  02/25/2016  . Cannabis use disorder, moderate, dependence (HCC) 02/25/2016    Past Surgical History:  Procedure Laterality Date  . ABDOMINAL SURGERY    . EYE SURGERY      Prior to Admission medications   Medication Sig Start Date End Date Taking? Authorizing Provider  albuterol (PROVENTIL HFA;VENTOLIN HFA) 108 (90 Base) MCG/ACT inhaler Inhale 2 puffs into the lungs every 6 (six) hours as needed. 10/04/16   Schaevitz, Myra Rude, MD  baclofen (LIORESAL) 10 MG tablet Take 1 tablet (10 mg total) by mouth 3 (three) times daily. 10/15/16   [name], FNP  benzonatate (TESSALON PERLES) 100 MG capsule Take 1-2 tabs TID prn cough 11/20/17   Menshew, Charlesetta Ivory, PA-C  cyclobenzaprine (FLEXERIL) 5 MG tablet Take 1 tablet (5 mg total) by mouth 3 (three) times daily as needed for muscle spasms. 11/20/17   Menshew, Charlesetta Ivory, PA-C  diclofenac (VOLTAREN) 75 MG EC tablet Take 1 tablet (75 mg total) by mouth 2 (two) times daily. Patient not taking: Reported on 10/04/2016 06/28/16   Evangeline Dakin, PA-C  diclofenac sodium (VOLTAREN) 1 % GEL Apply 2 g topically 4 (four) times daily. Patient not taking: Reported on 11/12/2017 04/25/17   Irean Hong, MD  diphenhydrAMINE (BENADRYL) 25 MG tablet Take 25 mg by mouth every 6 (six) hours as needed for allergies.     [provider]  gabapentin (NEURONTIN) 300 MG capsule Take 300 mg by  mouth 2 (two) times daily.    [provider]  gabapentin (NEURONTIN) 300 MG capsule Take 300 mg by mouth 3 (three) times daily.    [provider]  ibuprofen (ADVIL,MOTRIN) 800 MG tablet Take 1 tablet (800 mg total) by mouth every 8 (eight) hours as needed for headache (with food). 12/16/16   Rockne Menghini, MD  ketorolac (TORADOL) 10 MG tablet Take 1 tablet (10 mg total) by mouth every 6 (six) hours as needed. 01/14/18   Enid Derry, PA-C  levETIRAcetam (KEPPRA) 500 MG tablet Take 1 tablet (500 mg total) by mouth 2 (two) times daily.  03/01/18   Merrily Brittle, MD  meloxicam (MOBIC) 15 MG tablet Take 1 tablet (15 mg total) by mouth daily. Patient not taking: Reported on 11/12/2017 04/09/17   Cuthriell, Delorise Royals, PA-C  methocarbamol (ROBAXIN) 500 MG tablet Take 1 tablet (500 mg total) by mouth 4 (four) times daily. Patient not taking: Reported on 11/12/2017 04/09/17   Cuthriell, Delorise Royals, PA-C  metoCLOPramide (REGLAN) 10 MG tablet Take 1 tablet (10 mg total) by mouth every 6 (six) hours as needed. Patient not taking: Reported on 10/04/2016 07/03/16   Sharman Cheek, MD  mirtazapine (REMERON) 15 MG tablet Take 1 tablet (15 mg total) at bedtime by mouth. Patient not taking: Reported on 11/12/2017 07/08/17 08/07/17  Clapacs, Jackquline Denmark, MD  Multiple Vitamins-Minerals (MULTIVITAMIN WITH MINERALS) tablet Take 1 tablet by mouth daily.    [provider]  nabumetone (RELAFEN) 500 MG tablet Take 1 tablet (500 mg total) by mouth daily. 12/01/17   Joni Reining, PA-C  omeprazole (PRILOSEC OTC) 20 MG tablet Take 1 tablet (20 mg total) by mouth daily. Patient not taking: Reported on 10/04/2016 06/30/16 06/30/17  Loleta Rose, MD  ondansetron (ZOFRAN ODT) 4 MG disintegrating tablet Allow 1-2 tablets to dissolve in your mouth every 8 hours as needed for nausea/vomiting Patient not taking: Reported on 10/04/2016 06/30/16   Loleta Rose, MD  phenytoin (DILANTIN) 100 MG ER capsule Take 1 capsule (100 mg total) by mouth 3 (three) times daily. 11/12/17 02/10/18  Dionne Bucy, MD  predniSONE (DELTASONE) 10 MG tablet Take 6 tablets on day 1, take 5 tablets on day 2, take 4 tablets on day 3, take 3 tablets on day 4, take 2 tablets on day 5, take 1 tablet on day 6 01/14/18   Enid Derry, PA-C  sucralfate (CARAFATE) 1 g tablet Take 1 tablet (1 g total) by mouth 4 (four) times daily as needed (for abdominal discomfort, nausea, and/or vomiting). Patient not taking: Reported on 10/04/2016 06/30/16   Loleta Rose, MD  traMADol (ULTRAM) 50 MG tablet  Take 1 tablet (50 mg total) by mouth every 6 (six) hours as needed for moderate pain. 11/12/17   Dionne Bucy, MD    Allergies Patient has no known allergies.  Family History  Problem Relation Age of Onset  . Alzheimer's disease Mother   . AAA (abdominal aortic aneurysm) Father   . Breast cancer Sister 79    Social History Social History   Tobacco Use  . Smoking status: Current Every Day Smoker    Packs/day: 0.50    Types: Cigarettes    Last attempt to quit: 08/27/2014    Years since quitting: 3.5  . Smokeless tobacco: Never Used  Substance Use Topics  . Alcohol use: No  . Drug use: Yes    Types: Cocaine, Marijuana    Review of Systems Constitutional: No fever/chills Eyes: No visual changes. ENT:  Positive for tongue bite Cardiovascular: Denies chest pain. Respiratory: Denies shortness of breath. Gastrointestinal: No abdominal pain.  No nausea, positive for vomiting.  No diarrhea.  No constipation. Genitourinary: Negative for dysuria. Musculoskeletal: Negative for back pain. Skin: Negative for rash. Neurological: Positive for seizure   ____________________________________________   PHYSICAL EXAM:  VITAL SIGNS: ED Triage Vitals  Enc Vitals Group     BP      Pulse      Resp      Temp      Temp src      SpO2      Weight      Height      Head Circumference      Peak Flow      Pain Score      Pain Loc      Pain Edu?      Excl. in GC?     Constitutional: Awake and appropriate.  No distress.  Appears only mildly postictal at this point Eyes: PERRL EOMI. midrange and brisk Head: Atraumatic. Nose: No congestion/rhinnorhea. Mouth/Throat: No trismus bites to bilateral sides of her tongue.  No fasciculations Neck: No stridor.   Cardiovascular: Normal rate, regular rhythm. Grossly normal heart sounds.  Good peripheral circulation. Respiratory: Normal respiratory effort.  No retractions. Lungs CTAB and moving good air Gastrointestinal: Soft  nontender Musculoskeletal: No lower extremity edema   Neurologic:  Normal speech and language. No gross focal neurologic deficits are appreciated.  No hand tremors Skin:  Skin is warm, dry and intact. No rash noted. Psychiatric: Mood and affect are normal. Speech and behavior are normal.    ____________________________________________   DIFFERENTIAL includes but not limited to  Alcohol withdrawal, benzodiazepine withdrawal, seizure, metabolic derangement, medication noncompliance, arrhythmia ____________________________________________   LABS (all labs ordered are listed, but only abnormal results are displayed)  Labs Reviewed  COMPREHENSIVE METABOLIC PANEL - Abnormal; Notable for the following components:      Result Value   CO2 19 (*)    Glucose, Bld 142 (*)    Creatinine, Ser 1.13 (*)    GFR calc non Af Amer 53 (*)    Anion gap 16 (*)    All other components within normal limits  CBC WITH DIFFERENTIAL/PLATELET    Lab work reviewed by me shows slightly low CO2 consistent with recent seizure __________________________________________  EKG  ED ECG REPORT I, Merrily Brittle, the attending physician, personally viewed and interpreted this ECG.  Date: 03/01/2018 EKG Time:  Rate: 51 Rhythm: Sinus bradycardia QRS Axis: normal Intervals: normal ST/T Wave abnormalities: normal Narrative Interpretation: no evidence of acute ischemia  ____________________________________________  RADIOLOGY   ____________________________________________   PROCEDURES  Procedure(s) performed: no  Procedures  Critical Care performed: no  ____________________________________________   INITIAL IMPRESSION / ASSESSMENT AND PLAN / ED COURSE  Pertinent labs & imaging results that were available during my care of the patient were reviewed by me and considered in my medical decision making (see chart for details).   On arrival the patient is postictal with evidence of mouth trauma  consistent with a true seizure.  We will load her with Keppra now and reevaluate.     ----------------------------------------- 1:09 AM on 03/02/2018 -----------------------------------------  The patient may have had another seizure.  She is unclear but she was found to be somewhat confused with a little bit more blood coming out of her mouth.  On give her 2 mg of Ativan and re-dose the Keppra now. ____________________________________________  After several hours  of observation on monitor the patient had no ectopy and no recurrent seizure.  She is noncompliant with her medications.  At this point I will refill her Keppra for 1 month and refer her back to neurology.  She verbalized understanding agree with plan.  FINAL CLINICAL IMPRESSION(S) / ED DIAGNOSES  Final diagnoses:  Seizure (HCC)      NEW MEDICATIONS STARTED DURING THIS VISIT:  Discharge Medication List as of 03/02/2018  2:57 AM    START taking these medications   Details  levETIRAcetam (KEPPRA) 500 MG tablet Take 1 tablet (500 mg total) by mouth 2 (two) times daily., Starting Mon 03/01/2018, Print         Note:  This document was prepared using Dragon voice recognition software and may include unintentional dictation errors.     Merrily Brittleifenbark, Serine Kea, MD 03/04/18 1506

## 2018-03-01 NOTE — Discharge Instructions (Addendum)
Please begin taking your antiepileptic medication twice a day for the next month and follow-up with neurology within 2 weeks for reevaluation.  Return to the emergency department sooner for any concerns.  It was a pleasure to take care of you today, and thank you for coming to our emergency department.  If you have any questions or concerns before leaving please ask the nurse to grab me and I'm more than happy to go through your aftercare instructions again.  If you were prescribed any opioid pain medication today such as Norco, Vicodin, Percocet, morphine, hydrocodone, or oxycodone please make sure you do not drive when you are taking this medication as it can alter your ability to drive safely.  If you have any concerns once you are home that you are not improving or are in fact getting worse before you can make it to your follow-up appointment, please do not hesitate to call 911 and come back for further evaluation.  Nicole BrittleNeil Jaymen Fetch, MD  Results for orders placed or performed during the hospital encounter of 03/01/18  CBC with Differential  Result Value Ref Range   WBC 10.4 3.6 - 11.0 K/uL   RBC 3.96 3.80 - 5.20 MIL/uL   Hemoglobin 12.4 12.0 - 16.0 g/dL   HCT 09.837.1 11.935.0 - 14.747.0 %   MCV 93.7 80.0 - 100.0 fL   MCH 31.4 26.0 - 34.0 pg   MCHC 33.5 32.0 - 36.0 g/dL   RDW 82.913.7 56.211.5 - 13.014.5 %   Platelets 434 150 - 440 K/uL   Neutrophils Relative % 59 %   Neutro Abs 6.1 1.4 - 6.5 K/uL   Lymphocytes Relative 32 %   Lymphs Abs 3.4 1.0 - 3.6 K/uL   Monocytes Relative 6 %   Monocytes Absolute 0.6 0.2 - 0.9 K/uL   Eosinophils Relative 2 %   Eosinophils Absolute 0.2 0 - 0.7 K/uL   Basophils Relative 1 %   Basophils Absolute 0.1 0 - 0.1 K/uL  Comprehensive metabolic panel  Result Value Ref Range   Sodium 142 135 - 145 mmol/L   Potassium 3.5 3.5 - 5.1 mmol/L   Chloride 107 98 - 111 mmol/L   CO2 19 (L) 22 - 32 mmol/L   Glucose, Bld 142 (H) 70 - 99 mg/dL   BUN 8 6 - 20 mg/dL   Creatinine, Ser  8.651.13 (H) 0.44 - 1.00 mg/dL   Calcium 9.0 8.9 - 78.410.3 mg/dL   Total Protein 7.2 6.5 - 8.1 g/dL   Albumin 3.7 3.5 - 5.0 g/dL   AST 35 15 - 41 U/L   ALT <5 0 - 44 U/L   Alkaline Phosphatase 77 38 - 126 U/L   Total Bilirubin 0.7 0.3 - 1.2 mg/dL   GFR calc non Af Amer 53 (L) >60 mL/min   GFR calc Af Amer >60 >60 mL/min   Anion gap 16 (H) 5 - 15

## 2018-03-01 NOTE — ED Triage Notes (Signed)
Pt arrived via [facility] from home with c/o seizure. EMS states pt has a hx of seizures and had been on medication but has not been on any medication for the last couple of months. EMS states that pt was postictal upon arrival. Pt Axox4. MD at bedside.

## 2018-03-02 LAB — CBC WITH DIFFERENTIAL/PLATELET
Basophils Absolute: 0.1 10*3/uL (ref 0–0.1)
Basophils Relative: 1 %
Eosinophils Absolute: 0.2 10*3/uL (ref 0–0.7)
Eosinophils Relative: 2 %
HEMATOCRIT: 37.1 % (ref 35.0–47.0)
Hemoglobin: 12.4 g/dL (ref 12.0–16.0)
Lymphocytes Relative: 32 %
Lymphs Abs: 3.4 10*3/uL (ref 1.0–3.6)
MCH: 31.4 pg (ref 26.0–34.0)
MCHC: 33.5 g/dL (ref 32.0–36.0)
MCV: 93.7 fL (ref 80.0–100.0)
MONO ABS: 0.6 10*3/uL (ref 0.2–0.9)
Monocytes Relative: 6 %
NEUTROS ABS: 6.1 10*3/uL (ref 1.4–6.5)
NEUTROS PCT: 59 %
Platelets: 434 10*3/uL (ref 150–440)
RBC: 3.96 MIL/uL (ref 3.80–5.20)
RDW: 13.7 % (ref 11.5–14.5)
WBC: 10.4 10*3/uL (ref 3.6–11.0)

## 2018-03-02 LAB — COMPREHENSIVE METABOLIC PANEL
ALBUMIN: 3.7 g/dL (ref 3.5–5.0)
ALK PHOS: 77 U/L (ref 38–126)
AST: 35 U/L (ref 15–41)
Anion gap: 16 — ABNORMAL HIGH (ref 5–15)
BILIRUBIN TOTAL: 0.7 mg/dL (ref 0.3–1.2)
BUN: 8 mg/dL (ref 6–20)
CALCIUM: 9 mg/dL (ref 8.9–10.3)
CO2: 19 mmol/L — ABNORMAL LOW (ref 22–32)
CREATININE: 1.13 mg/dL — AB (ref 0.44–1.00)
Chloride: 107 mmol/L (ref 98–111)
GFR calc Af Amer: >60 mL/min (ref >60)
GFR, EST NON AFRICAN AMERICAN: 53 mL/min — AB (ref >60)
GLUCOSE: 142 mg/dL — AB (ref 70–99)
POTASSIUM: 3.5 mmol/L (ref 3.5–5.1)
Sodium: 142 mmol/L (ref 135–145)
TOTAL PROTEIN: 7.2 g/dL (ref 6.5–8.1)

## 2018-03-02 MED ORDER — LORAZEPAM 2 MG/ML IJ SOLN
INTRAMUSCULAR | Status: AC
  Filled 2018-03-02: qty 1

## 2018-03-02 MED ORDER — LORAZEPAM 2 MG/ML IJ SOLN
2.0000 mg | Freq: Once | INTRAMUSCULAR | Status: AC
  Administered 2018-03-02: 2 mg via INTRAVENOUS

## 2018-03-02 MED ORDER — LEVETIRACETAM 500 MG PO TABS
1000.0000 mg | ORAL_TABLET | Freq: Once | ORAL | Status: AC
  Administered 2018-03-02: 1000 mg via ORAL
  Filled 2018-03-02: qty 2

## 2018-03-02 NOTE — ED Notes (Signed)
At approximately 0106 this RN walking by patient's room.  Pt looking out of room with bright red substance coming from mouth.  This RN notified Aliene Altesole Amoriello, RN of patient's condition and Dr. Lamont Snowballifenbark.  Pt unable to respond to this RN at first.  Pt noted to have bleeding coming from a superficial cut to the R corner of mouth.  No other cuts in mouth noted at this time.  Pt able to come around and give this RN her name and birthday, but unable to answer any other orientation questions.  Pt states "How did I get here?"  Pt breathing even and non-labored.  Pt turning side to side at this time and appearing slightly agitated.  Spero GeraldsButch Woods, RN to administer ativan per MD order.

## 2018-04-12 ENCOUNTER — Encounter: Payer: Self-pay | Admitting: Emergency Medicine

## 2018-04-12 ENCOUNTER — Other Ambulatory Visit: Payer: Self-pay

## 2018-04-12 ENCOUNTER — Emergency Department
Admission: EM | Admit: 2018-04-12 | Discharge: 2018-04-12 | Disposition: A | Discharge status: Home / Self Care | Payer: Medicaid Other | Attending: Emergency Medicine | Admitting: Emergency Medicine

## 2018-04-12 DIAGNOSIS — Z79899 Other long term (current) drug therapy: Secondary | ICD-10-CM | POA: Diagnosis not present

## 2018-04-12 DIAGNOSIS — M5431 Sciatica, right side: Secondary | ICD-10-CM

## 2018-04-12 DIAGNOSIS — F17210 Nicotine dependence, cigarettes, uncomplicated: Secondary | ICD-10-CM | POA: Insufficient documentation

## 2018-04-12 DIAGNOSIS — M5441 Lumbago with sciatica, right side: Secondary | ICD-10-CM | POA: Diagnosis not present

## 2018-04-12 DIAGNOSIS — M545 Low back pain: Secondary | ICD-10-CM | POA: Diagnosis present

## 2018-04-12 MED ORDER — CYCLOBENZAPRINE HCL 5 MG PO TABS: 5.0000 mg | ORAL_TABLET | Freq: Three times a day (TID) | ORAL | 0 refills | Status: DC | PRN

## 2018-04-12 MED ORDER — KETOROLAC TROMETHAMINE 10 MG PO TABS: 10.0000 mg | ORAL_TABLET | Freq: Three times a day (TID) | ORAL | 0 refills | Status: DC

## 2018-04-12 MED ORDER — ORPHENADRINE CITRATE 30 MG/ML IJ SOLN
60.0000 mg | INTRAMUSCULAR | Status: AC
  Administered 2018-04-12: 60 mg via INTRAMUSCULAR
  Filled 2018-04-12: qty 2

## 2018-04-12 MED ORDER — KETOROLAC TROMETHAMINE 30 MG/ML IJ SOLN
30.0000 mg | Freq: Once | INTRAMUSCULAR | Status: AC
  Administered 2018-04-12: 30 mg via INTRAMUSCULAR
  Filled 2018-04-12: qty 1

## 2018-04-12 NOTE — ED Provider Notes (Signed)
[facility] Emergency Department Provider Note ____________________________________________  Time seen: 1919  I have reviewed the triage vital signs and the nursing notes.  HISTORY  Chief Complaint  Back Pain  HPI Nicole Chandler is a 57 y.o. female presents to the ED for evaluation of chronic persistent low back pain.  Patient with a history of sciatica as well as DDD, presents with 2 to 3-day flare of pain to the right low back and right lower extremity.  She denies any recent injury, accident, trauma, or fall.  Denies any foot drop, leg weakness, incontinence, or saddle anesthesias.  Patient takes gabapentin for her neuropathic pain, but denies any regular anti-inflammatory or muscle relaxant medications.  She remarks that if she had a muscle relaxant at home she might have avoided coming into the ED.  Denies any symptoms above baseline.  Past Medical History:  Diagnosis Date  . Arthritis   . Back pain   . Bipolar 1 disorder (HCC)   . Cancer (HCC)   . Chronic back pain unk  . Depression   . Panic attacks   . Sciatica   . Seizures [facility](HCC)     Patient Active Problem List   Diagnosis Date Noted  . Postictal state (HCC) 11/13/2017  . 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 Seizure (HCC) 03/24/2016  . Substance induced mood disorder (HCC) 03/05/2016  . Opioid use disorder, severe, dependence (HCC) 02/25/2016  . Cocaine use disorder, moderate, dependence (HCC) 02/25/2016  . Tobacco use disorder 02/25/2016  . Opioid-induced mood disorder (HCC) 02/25/2016  . Cannabis use disorder, moderate, dependence (HCC) 02/25/2016    Past Surgical History:  Procedure Laterality Date  . ABDOMINAL SURGERY    . EYE SURGERY      Prior to Admission medications   Medication Sig Start Date End Date Taking? Authorizing Provider  albuterol (PROVENTIL HFA;VENTOLIN HFA) 108 (90 Base) MCG/ACT inhaler Inhale 2 puffs into the lungs every  6 (six) hours as needed. 10/04/16   Schaevitz, Myra Rudeavid Matthew, MD  cyclobenzaprine (FLEXERIL) 5 MG tablet Take 1 tablet (5 mg total) by mouth 3 (three) times daily as needed for muscle spasms. 04/12/18   Witten Certain, Charlesetta IvoryJenise V Bacon, PA-C  diphenhydrAMINE (BENADRYL) 25 MG tablet Take 25 mg by mouth every 6 (six) hours as needed for allergies.     [provider]  gabapentin (NEURONTIN) 300 MG capsule Take 300 mg by mouth 2 (two) times daily.    [provider]  gabapentin (NEURONTIN) 300 MG capsule Take 300 mg by mouth 3 (three) times daily.    [provider]  ketorolac (TORADOL) 10 MG tablet Take 1 tablet (10 mg total) by mouth every 8 (eight) hours. 04/12/18   Ragnar Waas, Charlesetta IvoryJenise V Bacon, PA-C  levETIRAcetam (KEPPRA) 500 MG tablet Take 1 tablet (500 mg total) by mouth 2 (two) times daily. 03/01/18   Merrily Brittleifenbark, Neil, MD  mirtazapine (REMERON) 15 MG tablet Take 1 tablet (15 mg total) at bedtime by mouth. Patient not taking: Reported on 11/12/2017 07/08/17 08/07/17  Clapacs, Jackquline DenmarkJohn T, MD  Multiple Vitamins-Minerals (MULTIVITAMIN WITH MINERALS) tablet Take 1 tablet by mouth daily.    [provider]  phenytoin (DILANTIN) 100 MG ER capsule Take 1 capsule (100 mg total) by mouth 3 (three) times daily. 11/12/17 02/10/18  Dionne BucySiadecki, Sebastian, MD    Allergies Patient has no known allergies.  Family History  Problem Relation Age of Onset  . Alzheimer's disease Mother   .  AAA (abdominal aortic aneurysm) Father   . Breast cancer Sister [id number]    Social History Social History   Tobacco Use  . Smoking status: Current Every Day Smoker    Packs/day: 0.50    Types: Cigarettes    Last attempt to quit: 08/27/2014    Years since quitting: 3.6  . Smokeless tobacco: Never Used  Substance Use Topics  . Alcohol use: No  . Drug use: Yes    Types: Cocaine, Marijuana    Review of Systems  Constitutional: Negative for fever. Cardiovascular: Negative for chest pain. Respiratory: Negative  for shortness of breath. Gastrointestinal: Negative for abdominal pain, vomiting and diarrhea. Genitourinary: Negative for dysuria. Musculoskeletal: Negative for back pain.  Right lower extremity pain as above. Skin: Negative for rash. Neurological: Negative for headaches, focal weakness or numbness. ____________________________________________  PHYSICAL EXAM:  VITAL SIGNS: ED Triage Vitals  Enc Vitals Group     BP 04/12/18 1849 (!) 121/100     Pulse Rate 04/12/18 1849 73     Resp 04/12/18 1849 20     Temp 04/12/18 1849 98.1 F (36.7 C)     Temp Source 04/12/18 1849 Oral     SpO2 04/12/18 1849 97 %     Weight 04/12/18 1847 130 lb 1.1 oz (59 kg)     Height --      Head Circumference --      Peak Flow --      Pain Score 04/12/18 1847 10     Pain Loc --      Pain Edu? --      Excl. in GC? --     Constitutional: Alert and oriented. Well appearing and in no distress. Head: Normocephalic and atraumatic. Cardiovascular: Normal rate, regular rhythm. Normal distal pulses. Respiratory: Normal respiratory effort. No wheezes/rales/rhonchi. Gastrointestinal: Soft and nontender. No distention. Musculoskeletal: Nontender with normal range of motion in all extremities.  Neurologic:  Normal gait without ataxia. Normal speech and language. No gross focal neurologic deficits are appreciated. Skin:  Skin is warm, dry and intact. No rash noted. Psychiatric: Mood and affect are normal. Patient exhibits appropriate insight and judgment. ____________________________________________  PROCEDURES  Procedures Toradol 30 mg IM Norflex 60 mg IM ____________________________________________  INITIAL IMPRESSION / ASSESSMENT AND PLAN / ED COURSE  Patient with ED evaluation of an acute flare of her sciatic nerve irritation.  Her exam is benign and consistent with sciatica on the right.  She will be discharged with prescriptions for ketorolac as well as Flexeril. She will follow-up with her primary  provider for further evaluation and management.  Return precautions have been reviewed. ____________________________________________  FINAL CLINICAL IMPRESSION(S) / ED DIAGNOSES  Final diagnoses:  Sciatica of right side      Karmen StabsMenshew, Charlesetta IvoryJenise V Bacon, PA-C 04/12/18 [place] [id number]    Quale, Mark, MD 04/13/18 1039

## 2018-04-12 NOTE — Discharge Instructions (Addendum)
Take the prescription meds as directed. Follow-up with your provider for ongoing symptoms.  °

## 2018-04-12 NOTE — ED Triage Notes (Signed)
C/O right lower back pain and sciatic pain radiating down right leg.  Gait steady  Ambulatory.

## 2018-05-24 ENCOUNTER — Other Ambulatory Visit: Payer: Self-pay

## 2018-05-24 ENCOUNTER — Encounter: Payer: Self-pay | Admitting: Emergency Medicine

## 2018-05-24 ENCOUNTER — Emergency Department
Admission: EM | Admit: 2018-05-24 | Discharge: 2018-05-24 | Disposition: A | Discharge status: Home / Self Care | Payer: Medicaid Other | Attending: Emergency Medicine | Admitting: Emergency Medicine

## 2018-05-24 DIAGNOSIS — F419 Anxiety disorder, unspecified: Secondary | ICD-10-CM | POA: Diagnosis not present

## 2018-05-24 DIAGNOSIS — Z046 Encounter for general psychiatric examination, requested by authority: Secondary | ICD-10-CM | POA: Diagnosis not present

## 2018-05-24 DIAGNOSIS — T50902A Poisoning by unspecified drugs, medicaments and biological substances, intentional self-harm, initial encounter: Secondary | ICD-10-CM | POA: Diagnosis not present

## 2018-05-24 DIAGNOSIS — Z79899 Other long term (current) drug therapy: Secondary | ICD-10-CM | POA: Diagnosis not present

## 2018-05-24 DIAGNOSIS — F17210 Nicotine dependence, cigarettes, uncomplicated: Secondary | ICD-10-CM | POA: Diagnosis not present

## 2018-05-24 DIAGNOSIS — F1220 Cannabis dependence, uncomplicated: Secondary | ICD-10-CM | POA: Diagnosis present

## 2018-05-24 DIAGNOSIS — R569 Unspecified convulsions: Secondary | ICD-10-CM | POA: Diagnosis not present

## 2018-05-24 DIAGNOSIS — F4325 Adjustment disorder with mixed disturbance of emotions and conduct: Secondary | ICD-10-CM

## 2018-05-24 DIAGNOSIS — R4182 Altered mental status, unspecified: Secondary | ICD-10-CM | POA: Diagnosis present

## 2018-05-24 DIAGNOSIS — Z859 Personal history of malignant neoplasm, unspecified: Secondary | ICD-10-CM | POA: Insufficient documentation

## 2018-05-24 LAB — URINALYSIS, COMPLETE (UACMP) WITH MICROSCOPIC
Bacteria, UA: NONE SEEN
Bilirubin Urine: NEGATIVE
Glucose, UA: NEGATIVE mg/dL
Ketones, ur: NEGATIVE mg/dL
LEUKOCYTES UA: NEGATIVE
NITRITE: NEGATIVE
PH: 5 (ref 5.0–8.0)
Protein, ur: 30 mg/dL — AB
SPECIFIC GRAVITY, URINE: 1.019 (ref 1.005–1.030)

## 2018-05-24 LAB — COMPREHENSIVE METABOLIC PANEL
ALT: 9 U/L (ref 0–44)
ANION GAP: 12 (ref 5–15)
AST: 32 U/L (ref 15–41)
Albumin: 4.3 g/dL (ref 3.5–5.0)
Alkaline Phosphatase: 62 U/L (ref 38–126)
BUN: 8 mg/dL (ref 6–20)
CALCIUM: 9.2 mg/dL (ref 8.9–10.3)
CHLORIDE: 107 mmol/L (ref 98–111)
CO2: 23 mmol/L (ref 22–32)
CREATININE: 0.97 mg/dL (ref 0.44–1.00)
Glucose, Bld: 140 mg/dL — ABNORMAL HIGH (ref 70–99)
Potassium: 3.5 mmol/L (ref 3.5–5.1)
SODIUM: 142 mmol/L (ref 135–145)
Total Bilirubin: 0.5 mg/dL (ref 0.3–1.2)
Total Protein: 7.5 g/dL (ref 6.5–8.1)

## 2018-05-24 LAB — POCT PREGNANCY, URINE: Preg Test, Ur: NEGATIVE

## 2018-05-24 LAB — CBC WITH DIFFERENTIAL/PLATELET
Basophils Absolute: 0.1 10*3/uL (ref 0–0.1)
Basophils Relative: 1 %
EOS ABS: 0.2 10*3/uL (ref 0–0.7)
EOS PCT: 2 %
HCT: 41.9 % (ref 35.0–47.0)
Hemoglobin: 14.1 g/dL (ref 12.0–16.0)
LYMPHS ABS: 2.1 10*3/uL (ref 1.0–3.6)
LYMPHS PCT: 23 %
MCH: 31.7 pg (ref 26.0–34.0)
MCHC: 33.7 g/dL (ref 32.0–36.0)
MCV: 94 fL (ref 80.0–100.0)
MONO ABS: 0.5 10*3/uL (ref 0.2–0.9)
MONOS PCT: 6 %
Neutro Abs: 6.5 10*3/uL (ref 1.4–6.5)
Neutrophils Relative %: 68 %
PLATELETS: 305 10*3/uL (ref 150–440)
RBC: 4.46 MIL/uL (ref 3.80–5.20)
RDW: 14.4 % (ref 11.5–14.5)
WBC: 9.4 10*3/uL (ref 3.6–11.0)

## 2018-05-24 LAB — URINE DRUG SCREEN, QUALITATIVE (ARMC ONLY)
Amphetamines, Ur Screen: NOT DETECTED
Barbiturates, Ur Screen: NOT DETECTED
Benzodiazepine, Ur Scrn: POSITIVE — AB
CANNABINOID 50 NG, UR ~~LOC~~: POSITIVE — AB
COCAINE METABOLITE, UR ~~LOC~~: NOT DETECTED
MDMA (Ecstasy)Ur Screen: NOT DETECTED
METHADONE SCREEN, URINE: NOT DETECTED
OPIATE, UR SCREEN: NOT DETECTED
Phencyclidine (PCP) Ur S: NOT DETECTED
Tricyclic, Ur Screen: NOT DETECTED

## 2018-05-24 LAB — ACETAMINOPHEN LEVEL

## 2018-05-24 LAB — ETHANOL: Alcohol, Ethyl (B): <10 mg/dL (ref <10)

## 2018-05-24 LAB — PHENYTOIN LEVEL, TOTAL: Phenytoin Lvl: <2.5 ug/mL — ABNORMAL LOW (ref 10.0–20.0)

## 2018-05-24 LAB — SALICYLATE LEVEL: Salicylate Lvl: <7 mg/dL (ref 2.8–30.0)

## 2018-05-24 NOTE — ED Notes (Signed)
Pt is resting in the bed at this time. Denies pain. Asked her to provide urine specimen and she states she can't right now. Advised her to push call bell when she can give urine specimen.

## 2018-05-24 NOTE — ED Notes (Signed)
Pt given water to drink. 

## 2018-05-24 NOTE — ED Triage Notes (Addendum)
Pt arrived via EMS from home with reports of seizure and possible drug overdose.  Pt had witnessed seizure at home with SO and with EMS.  Per EMS SO reported that the pt frequently takes whatever medication she can get her hands on and has taken double the amount of prescribed gabapentin.  EMS reports there were multiple empty boxes of benadryl and SO reports pt will take anything that she can get her hands on that will alter her mentation.  Pt is non-adherent to seizure meds per EMS due to the cost.   Pt rolling around in the bed moaning and crying.  Pt was able to tell registration her name and birthday clearly.   Pt given 2mg  IM Versed with EMS. IV established after versed given and pt has attempted to remove IV en route to hospital.

## 2018-05-24 NOTE — Consult Note (Signed)
[name] does not meet criteria for IVC. Please discharge as appropriate. Full note to follow.

## 2018-05-24 NOTE — ED Notes (Signed)
Seizure pads placed on side rails of the stretcher.

## 2018-05-24 NOTE — ED Notes (Signed)
Meal tray given 

## 2018-05-24 NOTE — Consult Note (Signed)
[facility] Consult   Reason for Consult:  Overdose Referring Physician:  Dr. Cinda Quest Patient Identification: Nicole Chandler MRN:  [id number] Principal Diagnosis: Adjustment disorder with mixed disturbance of emotions and conduct Diagnosis:   Patient Active Problem List   Diagnosis Date Noted  . Adjustment disorder with mixed disturbance of emotions and conduct [F43.25] 07/08/2017    Priority: High  . Postictal state ([place]) [R56.9] 11/13/2017  . Seizure ([place]) [R56.9] 03/24/2016  . Substance induced mood disorder ([place]) [F19.94] 03/05/2016  . Opioid use disorder, severe, dependence ([place]) [F11.20] 02/25/2016  . Cocaine use disorder, moderate, dependence ([place]) [F14.20] 02/25/2016  . Tobacco use disorder [F17.200] 02/25/2016  . Opioid-induced mood disorder ([place]) [F11.94] 02/25/2016  . Cannabis use disorder, moderate, dependence ([place]) [F12.20] 02/25/2016    Total Time spent with patient: 1 hour   Identifying data. [name] is a 57 year old female with a history of depression, mood instability, seizure and substance abuse.  Chief complaint. "It was a mistake."  History of present illness. Information was obtained from the patient and the chart. The patient was brought to the ER after an episode of seizures. Apparently, the patient took too meny of her gabapentin pills which she insists was in error. She does not have a pill box and "forgot" that she has already taken her pills. Her live in boyfriend called EMS. EMS found an empty bottle of benadryl but the patient denies taking any. She has a history of prescription pill misuse as well as substance abuse. She adamantly denies any thoughts, intention or plans to hurt herself or others. She is not confused or sedated. She denies anxiety, depression or psychosis. She admits to cannabis use. She was positive for benzos on UDS. There are no symptoms of benzo withdrawal. VS are stable.  Past psychiatric history. History of mood  instability but denies hospitalizations or suicides. Over the years abused multiple substances. Not long ago, she was in [place] at [facility] receiving Suboxone. Claims good medication compliance including seizure medications. Unable to name her medicines.   Family psychiatric history. Denies any.   Social history. Lives with her boyfriend Does not drive. Disabled from seizures.  Risk to Self:   Risk to Others:   Prior Inpatient Therapy:   Prior Outpatient Therapy:    Past Medical History:  Past Medical History:  Diagnosis Date  . Arthritis   . Back pain   . Bipolar 1 disorder ([place])   . Cancer ([place])   . Chronic back pain unk  . Depression   . Panic attacks   . Sciatica   . Seizures ([place])     Past Surgical History:  Procedure Laterality Date  . ABDOMINAL SURGERY    . EYE SURGERY     Family History:  Family History  Problem Relation Age of Onset  . Alzheimer's disease Mother   . AAA (abdominal aortic aneurysm) Father   . Breast cancer Sister 83   Social History:  Social History   Substance and Sexual Activity  Alcohol Use No     Social History   Substance and Sexual Activity  Drug Use Yes  . Types: Cocaine, Marijuana    Social History   Socioeconomic History  . Marital status: Single    Spouse name: Not on file  . Number of children: Not on file  . Years of education: Not on file  . Highest education level: Not on file  Occupational History  . Not on file  Social Needs  .  Financial resource strain: Not on file  . Food insecurity:    Worry: Not on file    Inability: Not on file  . Transportation needs:    Medical: Not on file    Non-medical: Not on file  Tobacco Use  . Smoking status: Current Every Day Smoker    Packs/day: 0.50    Types: Cigarettes    Last attempt to quit: 08/27/2014    Years since quitting: 3.7  . Smokeless tobacco: Never Used  Substance and Sexual Activity  . Alcohol use: No  . Drug use: Yes    Types: Cocaine, Marijuana  . Sexual  activity: Not Currently  Lifestyle  . Physical activity:    Days per week: Not on file    Minutes per session: Not on file  . Stress: Not on file  Relationships  . Social connections:    Talks on phone: Not on file    Gets together: Not on file    Attends religious service: Not on file    Active member of club or organization: Not on file    Attends meetings of clubs or organizations: Not on file    Relationship status: Not on file  Other Topics Concern  . Not on file  Social History Narrative   ** Merged History Encounter **       Additional Social History:    Allergies:   Allergies  Allergen Reactions  . Cyclobenzaprine     "makes the pt jumpy"  . Naproxen Sodium     Intestinal bloating that was so bad she had to go to the ER    Labs:  Results for orders placed or performed during the hospital encounter of 05/24/18 (from the past 48 hour(s))  CBC with Differential     Status: None   Collection Time: 05/24/18  9:03 AM  Result Value Ref Range   WBC 9.4 3.6 - 11.0 K/uL   RBC 4.46 3.80 - 5.20 MIL/uL   Hemoglobin 14.1 12.0 - 16.0 g/dL   HCT 41.9 35.0 - 47.0 %   MCV 94.0 80.0 - 100.0 fL   MCH 31.7 26.0 - 34.0 pg   MCHC 33.7 32.0 - 36.0 g/dL   RDW 14.4 11.5 - 14.5 %   Platelets 305 150 - 440 K/uL   Neutrophils Relative % 68 %   Neutro Abs 6.5 1.4 - 6.5 K/uL   Lymphocytes Relative 23 %   Lymphs Abs 2.1 1.0 - 3.6 K/uL   Monocytes Relative 6 %   Monocytes Absolute 0.5 0.2 - 0.9 K/uL   Eosinophils Relative 2 %   Eosinophils Absolute 0.2 0 - 0.7 K/uL   Basophils Relative 1 %   Basophils Absolute 0.1 0 - 0.1 K/uL    Comment: Performed at [facility], [address] [place], [place] [address]  Comprehensive metabolic panel     Status: Abnormal   Collection Time: 05/24/18  9:03 AM  Result Value Ref Range   Sodium 142 135 - 145 mmol/L   Potassium 3.5 3.5 - 5.1 mmol/L   Chloride 107 98 - 111 mmol/L   CO2 23 22 - 32 mmol/L   Glucose, Bld 140 (H) 70 - 99  mg/dL   BUN 8 6 - 20 mg/dL   Creatinine, Ser 0.97 0.44 - 1.00 mg/dL   Calcium 9.2 8.9 - 10.3 mg/dL   Total Protein 7.5 6.5 - 8.1 g/dL   Albumin 4.3 3.5 - 5.0 g/dL   AST 32 15 -  41 U/L   ALT 9 0 - 44 U/L   Alkaline Phosphatase 62 38 - 126 U/L   Total Bilirubin 0.5 0.3 - 1.2 mg/dL   GFR calc non Af Amer >60 >60 mL/min   GFR calc Af Amer >60 >60 mL/min    Comment: (NOTE) The eGFR has been calculated using the CKD EPI equation. This calculation has not been validated in all clinical situations. eGFR's persistently <60 mL/min signify possible Chronic Kidney Disease.    Anion gap 12 5 - 15    Comment: Performed at [facility], [address] [place], [place] [address]  Ethanol     Status: None   Collection Time: 05/24/18  9:03 AM  Result Value Ref Range   Alcohol, Ethyl (B) <10 <10 mg/dL    Comment: (NOTE) Lowest detectable limit for serum alcohol is 10 mg/dL. For medical purposes only. Performed at [facility], [address] [place], [place] [address]   Salicylate level     Status: None   Collection Time: 05/24/18  9:03 AM  Result Value Ref Range   Salicylate Lvl <0.9 2.8 - 30.0 mg/dL    Comment: Performed at [facility], [address] [place], [place] [address]  Acetaminophen level     Status: Abnormal   Collection Time: 05/24/18  9:03 AM  Result Value Ref Range   Acetaminophen (Tylenol), Serum <10 (L) 10 - 30 ug/mL    Comment: (NOTE) Therapeutic concentrations vary significantly. A range of 10-30 ug/mL  may be an effective concentration for many patients. However, some  are best treated at concentrations outside of this range. Acetaminophen concentrations >150 ug/mL at 4 hours after ingestion  and >50 ug/mL at 12 hours after ingestion are often associated with  toxic reactions. Performed at [facility], [address] [place], [place] [address]   Urinalysis, Complete w Microscopic     Status: Abnormal   Collection Time:  05/24/18 12:32 PM  Result Value Ref Range   Color, Urine YELLOW (A) YELLOW   APPearance HAZY (A) CLEAR   Specific Gravity, Urine 1.019 1.005 - 1.030   pH 5.0 5.0 - 8.0   Glucose, UA NEGATIVE NEGATIVE mg/dL   Hgb urine dipstick SMALL (A) NEGATIVE   Bilirubin Urine NEGATIVE NEGATIVE   Ketones, ur NEGATIVE NEGATIVE mg/dL   Protein, ur 30 (A) NEGATIVE mg/dL   Nitrite NEGATIVE NEGATIVE   Leukocytes, UA NEGATIVE NEGATIVE   RBC / HPF 6-10 0 - 5 RBC/hpf   WBC, UA 0-5 0 - 5 WBC/hpf   Bacteria, UA NONE SEEN NONE SEEN   Squamous Epithelial / LPF 0-5 0 - 5   Mucus PRESENT     Comment: Performed at [facility], [address] [place], [place] [address]  Urine Drug Screen, Qualitative     Status: Abnormal   Collection Time: 05/24/18 12:32 PM  Result Value Ref Range   Tricyclic, Ur Screen NONE DETECTED NONE DETECTED   Amphetamines, Ur Screen NONE DETECTED NONE DETECTED   MDMA (Ecstasy)Ur Screen NONE DETECTED NONE DETECTED   Cocaine Metabolite,Ur [place] NONE DETECTED NONE DETECTED   Opiate, Ur Screen NONE DETECTED NONE DETECTED   Phencyclidine (PCP) Ur S NONE DETECTED NONE DETECTED   Cannabinoid 50 Ng, Ur  POSITIVE (A) NONE DETECTED   Barbiturates, Ur Screen NONE DETECTED NONE DETECTED   Benzodiazepine, Ur Scrn POSITIVE (A) NONE DETECTED   Methadone Scn, Ur NONE DETECTED NONE DETECTED    Comment: (NOTE) Tricyclics +  metabolites, urine    Cutoff 1000 ng/mL Amphetamines + metabolites, urine  Cutoff 1000 ng/mL MDMA (Ecstasy), urine              Cutoff 500 ng/mL Cocaine Metabolite, urine          Cutoff 300 ng/mL Opiate + metabolites, urine        Cutoff 300 ng/mL Phencyclidine (PCP), urine         Cutoff 25 ng/mL Cannabinoid, urine                 Cutoff 50 ng/mL Barbiturates + metabolites, urine  Cutoff 200 ng/mL Benzodiazepine, urine              Cutoff 200 ng/mL Methadone, urine                   Cutoff 300 ng/mL The urine drug screen provides only a preliminary,  unconfirmed analytical test result and should not be used for non-medical purposes. Clinical consideration and professional judgment should be applied to any positive drug screen result due to possible interfering substances. A more specific alternate chemical method must be used in order to obtain a confirmed analytical result. Gas chromatography / mass spectrometry (GC/MS) is the preferred confirmat ory method. Performed at [facility], [address] [place], [place] [address]   Pregnancy, urine POC     Status: None   Collection Time: 05/24/18 12:35 PM  Result Value Ref Range   Preg Test, Ur NEGATIVE NEGATIVE    Comment:        THE SENSITIVITY OF THIS METHODOLOGY IS >24 mIU/mL   Phenytoin level, total     Status: Abnormal   Collection Time: 05/24/18  3:45 PM  Result Value Ref Range   Phenytoin Lvl <2.5 (L) 10.0 - 20.0 ug/mL    Comment: Performed at [facility], [address] [place], [place] [address]    No current facility-administered medications for this encounter.    Current Outpatient Medications  Medication Sig Dispense Refill  . diphenhydrAMINE (BENADRYL) 25 MG tablet Take 25 mg by mouth every 6 (six) hours as needed for allergies.     Marland Kitchen gabapentin (NEURONTIN) 600 MG tablet Take 600 mg by mouth 4 (four) times daily.    . cyclobenzaprine (FLEXERIL) 5 MG tablet Take 1 tablet (5 mg total) by mouth 3 (three) times daily as needed for muscle spasms. (Patient not taking: Reported on 05/24/2018) 15 tablet 0  . ketorolac (TORADOL) 10 MG tablet Take 1 tablet (10 mg total) by mouth every 8 (eight) hours. (Patient not taking: Reported on 05/24/2018) 15 tablet 0  . levETIRAcetam (KEPPRA) 500 MG tablet Take 1 tablet (500 mg total) by mouth 2 (two) times daily. (Patient not taking: Reported on 05/24/2018) 60 tablet 0  . mirtazapine (REMERON) 15 MG tablet Take 1 tablet (15 mg total) at bedtime by mouth. (Patient not taking: Reported on 11/12/2017) 30 tablet 0  .  phenytoin (DILANTIN) 100 MG ER capsule Take 1 capsule (100 mg total) by mouth 3 (three) times daily. (Patient not taking: Reported on 05/24/2018) 90 capsule 2    Musculoskeletal: Strength & Muscle Tone: within normal limits Gait & Station: normal Patient leans: N/A  Psychiatric Specialty Exam: Physical Exam  Nursing note and vitals reviewed. Psychiatric: She has a normal mood and affect. Her speech is normal and behavior is normal. Thought content normal. Cognition and memory are normal. She expresses impulsivity.    Review of Systems  Neurological:  Positive for seizures.  Psychiatric/Behavioral: Positive for substance abuse.  All other systems reviewed and are negative.   Blood pressure 133/82, pulse 80, temperature 98 F (36.7 C), temperature source Oral, resp. rate 14, height _0  (1.6 m), weight 59 kg, last menstrual period 12/17/2007, SpO2 98 %.Body mass index is 23.04 kg/m.  General Appearance: Fairly Groomed  Eye Contact:  Good  Speech:  Clear and Coherent  Volume:  Normal  Mood:  Euthymic  Affect:  Appropriate  Thought Process:  Goal Directed and Descriptions of Associations: Intact  Orientation:  Full (Time, Place, and Person)  Thought Content:  WDL  Suicidal Thoughts:  No  Homicidal Thoughts:  No  Memory:  Immediate;   Fair Recent;   Fair Remote;   Fair  Judgement:  Impaired  Insight:  Lacking  Psychomotor Activity:  Normal  Concentration:  Concentration: Fair and Attention Span: Fair  Recall:  [employer] of Knowledge:  Fair  Language:  Fair  Akathisia:  No  Handed:  Right  AIMS (if indicated):     Assets:  Communication Skills Desire for Improvement Financial Resources/Insurance Housing Resilience Social Support  ADL's:  Intact  Cognition:  WNL  Sleep:        Treatment Plan Summary: Daily contact with patient to assess and evaluate symptoms and progress in treatment and Medication management  Disposition: No evidence of imminent risk to self or  others at present.   Patient does not meet criteria for psychiatric inpatient admission. Supportive therapy provided about ongoing stressors. Refer to IOP. Discussed crisis plan, support from social network, calling 911, coming to the Emergency Department, and calling Suicide Hotline.   PLAN: Patient does not meet criteria for IVC. Please discharge as appropriate. No medication changes. Follow up with [facility]. Case discussed with Dr. Rip Harbour.  Orson Slick, MD 05/24/2018 5:38 PM

## 2018-05-24 NOTE — ED Notes (Signed)
Bill Salinas, RN at poison control, called to check on pt. Information given. Beth states there are no further recommendations at this time and pt can be d/c at MD's discretion.

## 2018-05-24 NOTE — ED Notes (Signed)
Pt sister Zenon Mayo arrived, spoke with her in the lobby informed her that the patient was resting at this time and will be here for the next 6 hours or so at least for monitoring.  Sister said she would come by later. I told sister I would have the patient call her when she wakes up.

## 2018-05-24 NOTE — ED Notes (Signed)
Pt becoming restless and states she wants to go home. MD made aware and he advised psychiatrist is coming to see pt. Pt made aware of this. She agrees to wait at this time. Will continue to monitor pt.

## 2018-05-24 NOTE — ED Provider Notes (Signed)
[facility] Emergency Department Provider Note   ____________________________________________   First MD Initiated Contact with Patient 05/24/18 [phone]     (approximate)  I have reviewed the triage vital signs and the nursing notes.   HISTORY  Chief Complaint Seizures and Altered Mental Status   HPI Quinlyn Chandler is a 57 y.o. female patient with one seizure at group home.  SHe bit her tongue.  Patient also has overdosed.  Approximately 50 gabapentin pills of hers are missing.  It was thought that she may have taken Benadryl but patient denies taking any Benadryl.  She does not remember what she did take though.  She does have a history of seizures and takes Keppra for them.  Patient is currently slightly sleepy although she got Versed in the ambulance but otherwise she is oriented x3 and somewhat anxious.  She has a history of taking any pills that she can get a hold off.   Past Medical History:  Diagnosis Date  . Arthritis   . Back pain   . Bipolar 1 disorder (HCC)   . Cancer (HCC)   . Chronic back pain unk  . Depression   . Panic attacks   . Sciatica   . Seizures [facility])     Patient Active Problem List   Diagnosis Date Noted  . Postictal state (HCC) 11/13/2017  . 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 Seizure (HCC) 03/24/2016  . Substance induced mood disorder (HCC) 03/05/2016  . Opioid use disorder, severe, dependence (HCC) 02/25/2016  . Cocaine use disorder, moderate, dependence (HCC) 02/25/2016  . Tobacco use disorder 02/25/2016  . Opioid-induced mood disorder (HCC) 02/25/2016  . Cannabis use disorder, moderate, dependence (HCC) 02/25/2016    Past Surgical History:  Procedure Laterality Date  . ABDOMINAL SURGERY    . EYE SURGERY      Prior to Admission medications   Medication Sig Start Date End Date Taking? Authorizing Provider  diphenhydrAMINE (BENADRYL) 25 MG tablet Take 25 mg by mouth  every 6 (six) hours as needed for allergies.    Yes [provider]  gabapentin (NEURONTIN) 600 MG tablet Take 600 mg by mouth 4 (four) times daily.   Yes [provider]  cyclobenzaprine (FLEXERIL) 5 MG tablet Take 1 tablet (5 mg total) by mouth 3 (three) times daily as needed for muscle spasms. Patient not taking: Reported on 05/24/2018 04/12/18   Menshew, Charlesetta Ivory, PA-C  ketorolac (TORADOL) 10 MG tablet Take 1 tablet (10 mg total) by mouth every 8 (eight) hours. Patient not taking: Reported on 05/24/2018 04/12/18   Menshew, Charlesetta Ivory, PA-C  levETIRAcetam (KEPPRA) 500 MG tablet Take 1 tablet (500 mg total) by mouth 2 (two) times daily. Patient not taking: Reported on 05/24/2018 03/01/18   Merrily Brittle, MD  mirtazapine (REMERON) 15 MG tablet Take 1 tablet (15 mg total) at bedtime by mouth. Patient not taking: Reported on 11/12/2017 07/08/17 08/07/17  Clapacs, Jackquline Denmark, MD  phenytoin (DILANTIN) 100 MG ER capsule Take 1 capsule (100 mg total) by mouth 3 (three) times daily. Patient not taking: Reported on 05/24/2018 11/12/17 02/10/18  Dionne Bucy, MD    Allergies Cyclobenzaprine and Naproxen sodium  Family History  Problem Relation Age of Onset  . Alzheimer's disease Mother   . AAA (abdominal aortic aneurysm) Father   . Breast cancer Sister 48    Social History Social History   Tobacco Use  . Smoking status: Current  Every Day Smoker    Packs/day: 0.50    Types: Cigarettes    Last attempt to quit: 08/27/2014    Years since quitting: 3.7  . Smokeless tobacco: Never Used  Substance Use Topics  . Alcohol use: No  . Drug use: Yes    Types: Cocaine, Marijuana    Review of Systems  Constitutional: No fever/chills Eyes: No visual changes. ENT: No sore throat. Cardiovascular: Denies chest pain. Respiratory: Denies shortness of breath. Gastrointestinal: No abdominal pain.  No nausea, no vomiting.  No diarrhea.  No constipation. Genitourinary: Negative for  dysuria. Musculoskeletal: Negative for back pain. Skin: Negative for rash. Neurological: Negative for headaches, focal weakness   ____________________________________________   PHYSICAL EXAM:  VITAL SIGNS: ED Triage Vitals  Enc Vitals Group     BP 05/24/18 0845 139/84     Pulse Rate 05/24/18 0845 81     Resp 05/24/18 0845 16     Temp 05/24/18 0845 98 F (36.7 C)     Temp Source 05/24/18 0845 Oral     SpO2 05/24/18 0845 95 %     Weight 05/24/18 0851 130 lb 1.1 oz (59 kg)     Height 05/24/18 0851 5\' 3"  (1.6 m)     Head Circumference --      Peak Flow --      Pain Score --      Pain Loc --      Pain Edu? --      Excl. in GC? --     Constitutional: Alert and oriented. Well appearing and in no acute distress. Eyes: Conjunctivae are normal. PERRL. EOMI. Head: Atraumatic. Nose: No congestion/rhinnorhea. Mouth/Throat: Mucous membranes are moist.  Oropharynx non-erythematous.  She has a bite on the undersurface of the right side of the tip of the tongue.  Edges are well adherent Neck: No stridor.  Cardiovascular: Normal rate, regular rhythm. Grossly normal heart sounds.  Good peripheral circulation. Respiratory: Normal respiratory effort.  No retractions. Lungs CTAB. Gastrointestinal: Soft and nontender. No distention. No abdominal bruits. No CVA tenderness. Musculoskeletal: No lower extremity tenderness nor edema.   Neurologic:  Normal speech and language. No gross focal neurologic deficits are appreciated Skin:  Skin is warm, dry and intact. No rash noted. Psychiatric: Mood and affect are normal see more description in HPI speech and behavior are normal.  ____________________________________________   LABS (all labs ordered are listed, but only abnormal results are displayed)  Labs Reviewed  URINALYSIS, COMPLETE (UACMP) WITH MICROSCOPIC - Abnormal; Notable for the following components:      Result Value   Color, Urine YELLOW (*)    APPearance HAZY (*)    Hgb urine  dipstick SMALL (*)    Protein, ur 30 (*)    All other components within normal limits  URINE DRUG SCREEN, QUALITATIVE ([facility] ONLY) - Abnormal; Notable for the following components:   Cannabinoid 50 Ng, Ur  POSITIVE (*)    Benzodiazepine, Ur Scrn POSITIVE (*)    All other components within normal limits  COMPREHENSIVE METABOLIC PANEL - Abnormal; Notable for the following components:   Glucose, Bld 140 (*)    All other components within normal limits  ACETAMINOPHEN LEVEL - Abnormal; Notable for the following components:   Acetaminophen (Tylenol), Serum <10 (*)    All other components within normal limits  PHENYTOIN LEVEL, TOTAL - Abnormal; Notable for the following components:   Phenytoin Lvl <2.5 (*)    All other components within normal limits  CBC WITH  DIFFERENTIAL/PLATELET  ETHANOL  SALICYLATE LEVEL  POC URINE PREG, ED  POCT PREGNANCY, URINE   ____________________________________________  EKG  EKG read and interpreted by me shows normal sinus rhythm rate of 80 normal axis no acute changes QRS duration is 95 ms  EKG #2 read and interpreted by me shows normal sinus rhythm rate of 85 normal axis essentially normal EKG QRS duration is actually shortened and 2 ms  EKG #3 read and interpreted by me shows normal sinus rhythm 80 normal axis QRS duration is back to 95 ms really no change from the previous 2  EKG #4 done at 326 shows sinus rhythm rate of 64 normal axis no acute changes the QRS duration is now 90 ms ____________________________________________  RADIOLOGY  ED MD interpretation:   Official radiology report(s): No results found.  ____________________________________________   PROCEDURES  Procedure(s) performed:  Procedures  Critical Care performed:   ____________________________________________   INITIAL IMPRESSION / ASSESSMENT AND PLAN / ED COURSE  ----------------------------------------- 12:34 PM on  05/24/2018 -----------------------------------------  Patient is now fully awake alert acting normally.  She continues to deny homicidal or suicidal ideation.  She does have a history of taking any medication she can get her hands on.    Clinical Course as of May 24 1618  Mon May 24, 2018  1329 ED EKG [PM]    Clinical Course User Index [PM] Arnaldo Natal, MD     ____________________________________________   FINAL CLINICAL IMPRESSION(S) / ED DIAGNOSES  Final diagnoses:  Intentional drug overdose, initial encounter [facility])     ED Discharge Orders    None       Note:  This document was prepared using Dragon voice recognition software and may include unintentional dictation errors.    Arnaldo Natal, MD 05/24/18 [phone]

## 2018-05-24 NOTE — Discharge Instructions (Signed)
These take your medicines only as directed.  Please follow-up with your primary caregiver.

## 2018-08-31 ENCOUNTER — Emergency Department: Payer: Medicaid Other

## 2018-08-31 ENCOUNTER — Encounter: Payer: Self-pay | Admitting: Emergency Medicine

## 2018-08-31 ENCOUNTER — Emergency Department
Admission: EM | Admit: 2018-08-31 | Discharge: 2018-09-01 | Disposition: A | Discharge status: Other Acute Inpatient Hospital | Payer: Medicaid Other | Attending: Emergency Medicine | Admitting: Emergency Medicine

## 2018-08-31 ENCOUNTER — Other Ambulatory Visit: Payer: Self-pay

## 2018-08-31 DIAGNOSIS — Z859 Personal history of malignant neoplasm, unspecified: Secondary | ICD-10-CM | POA: Diagnosis not present

## 2018-08-31 DIAGNOSIS — F1420 Cocaine dependence, uncomplicated: Secondary | ICD-10-CM | POA: Insufficient documentation

## 2018-08-31 DIAGNOSIS — F4325 Adjustment disorder with mixed disturbance of emotions and conduct: Secondary | ICD-10-CM | POA: Diagnosis present

## 2018-08-31 DIAGNOSIS — Z79899 Other long term (current) drug therapy: Secondary | ICD-10-CM | POA: Insufficient documentation

## 2018-08-31 DIAGNOSIS — F32A Depression, unspecified: Secondary | ICD-10-CM

## 2018-08-31 DIAGNOSIS — F1120 Opioid dependence, uncomplicated: Secondary | ICD-10-CM | POA: Insufficient documentation

## 2018-08-31 DIAGNOSIS — F1220 Cannabis dependence, uncomplicated: Secondary | ICD-10-CM | POA: Diagnosis not present

## 2018-08-31 DIAGNOSIS — R45851 Suicidal ideations: Secondary | ICD-10-CM | POA: Diagnosis not present

## 2018-08-31 DIAGNOSIS — F329 Major depressive disorder, single episode, unspecified: Secondary | ICD-10-CM | POA: Diagnosis not present

## 2018-08-31 DIAGNOSIS — F17210 Nicotine dependence, cigarettes, uncomplicated: Secondary | ICD-10-CM | POA: Insufficient documentation

## 2018-08-31 DIAGNOSIS — F332 Major depressive disorder, recurrent severe without psychotic features: Secondary | ICD-10-CM

## 2018-08-31 LAB — GASTROINTESTINAL PANEL BY PCR, STOOL (REPLACES STOOL CULTURE)

## 2018-08-31 LAB — COMPREHENSIVE METABOLIC PANEL
ALK PHOS: 118 U/L (ref 38–126)
AST: 24 U/L (ref 15–41)
Albumin: 5.1 g/dL — ABNORMAL HIGH (ref 3.5–5.0)
Anion gap: 9 (ref 5–15)
BILIRUBIN TOTAL: 0.7 mg/dL (ref 0.3–1.2)
BUN: <5 mg/dL — ABNORMAL LOW (ref 6–20)
CALCIUM: 10.1 mg/dL (ref 8.9–10.3)
CO2: 23 mmol/L (ref 22–32)
CREATININE: 1.12 mg/dL — AB (ref 0.44–1.00)
Chloride: 102 mmol/L (ref 98–111)
GFR, EST NON AFRICAN AMERICAN: 54 mL/min — AB (ref >60)
Glucose, Bld: 117 mg/dL — ABNORMAL HIGH (ref 70–99)
Potassium: 4.3 mmol/L (ref 3.5–5.1)
Sodium: 134 mmol/L — ABNORMAL LOW (ref 135–145)
TOTAL PROTEIN: 9 g/dL — AB (ref 6.5–8.1)

## 2018-08-31 LAB — CBC
HEMATOCRIT: 50.8 % — AB (ref 36.0–46.0)
Hemoglobin: 16.2 g/dL — ABNORMAL HIGH (ref 12.0–15.0)
MCH: 30.7 pg (ref 26.0–34.0)
MCHC: 31.9 g/dL (ref 30.0–36.0)
MCV: 96.2 fL (ref 80.0–100.0)
Platelets: 425 10*3/uL — ABNORMAL HIGH (ref 150–400)
RBC: 5.28 MIL/uL — ABNORMAL HIGH (ref 3.87–5.11)
RDW: 12.6 % (ref 11.5–15.5)
WBC: 9.2 10*3/uL (ref 4.0–10.5)
nRBC: 0 % (ref 0.0–0.2)

## 2018-08-31 LAB — URINE DRUG SCREEN, QUALITATIVE (ARMC ONLY)
Amphetamines, Ur Screen: NOT DETECTED
BENZODIAZEPINE, UR SCRN: NOT DETECTED
Barbiturates, Ur Screen: NOT DETECTED
CANNABINOID 50 NG, UR ~~LOC~~: POSITIVE — AB
Cocaine Metabolite,Ur ~~LOC~~: NOT DETECTED
MDMA (Ecstasy)Ur Screen: NOT DETECTED
METHADONE SCREEN, URINE: NOT DETECTED
OPIATE, UR SCREEN: NOT DETECTED
Phencyclidine (PCP) Ur S: NOT DETECTED
TRICYCLIC, UR SCREEN: NOT DETECTED

## 2018-08-31 LAB — ACETAMINOPHEN LEVEL: Acetaminophen (Tylenol), Serum: <10 ug/mL — ABNORMAL LOW (ref 10–30)

## 2018-08-31 LAB — C DIFFICILE QUICK SCREEN W PCR REFLEX
C DIFFICILE (CDIFF) INTERP: NOT DETECTED
C DIFFICILE (CDIFF) TOXIN: NEGATIVE
C Diff antigen: NEGATIVE

## 2018-08-31 LAB — ETHANOL

## 2018-08-31 LAB — SALICYLATE LEVEL

## 2018-08-31 MED ORDER — NICOTINE 21 MG/24HR TD PT24
21.0000 mg | MEDICATED_PATCH | Freq: Once | TRANSDERMAL | Status: DC
  Administered 2018-08-31: 21 mg via TRANSDERMAL
  Filled 2018-08-31: qty 1

## 2018-08-31 NOTE — ED Notes (Signed)
PT  VOL  MOVED  TO  [facility]

## 2018-08-31 NOTE — ED Notes (Signed)
Hourly rounding reveals patient sleeping in room. No complaints, stable, in no acute distress. Q15 minute rounds and monitoring via Security Cameras to continue. 

## 2018-08-31 NOTE — ED Notes (Signed)
Hourly rounding reveals patient in room. No complaints, stable, in no acute distress. Q15 minute rounds and monitoring via Security Cameras to continue. 

## 2018-08-31 NOTE — ED Notes (Signed)
Patient assigned to appropriate care area   Introduced self to pt  Patient oriented to unit/care area: Informed that, for their safety, care areas are designed for safety and visiting and phone hours explained to patient. Patient verbalizes understanding, and verbal contract for safety obtained  Environment secured  Patient very tearful

## 2018-08-31 NOTE — ED Triage Notes (Signed)
Patient tearful and is with crisis counselor.  She she is suicidal.

## 2018-08-31 NOTE — ED Notes (Signed)
Moved to BHU-3 

## 2018-08-31 NOTE — ED Notes (Signed)
Report to include Situation, Background, Assessment, and Recommendations received from Rhea RN. Patient alert and oriented, warm and dry, in no acute distress. Patient denies SI, HI, AVH and pain. Patient made aware of Q15 minute rounds and security cameras for their safety. Patient instructed to come to me with needs or concerns. 

## 2018-08-31 NOTE — ED Notes (Signed)
Patient went to xray 

## 2018-08-31 NOTE — ED Provider Notes (Signed)
[facility] Emergency Department Provider Note   ____________________________________________   First MD Initiated Contact with Patient 08/31/18 1626     (approximate)  I have reviewed the triage vital signs and the nursing notes.   HISTORY  Chief Complaint Depression and Suicidal   HPI Nicole Chandler is a 58 y.o. female says she is very depressed.  Thinking of hurting herself.  She lost her son in summer due to a heroin heroin overdose and cannot get over it and cannot care of the fact she did not see him in the cough and after he died.  She has had to get up her 4 dogs and lost her fianc several years ago and just cannot stop thinking about the whole thing.  She is tearful in the ER.  Also complains of a crick in her neck and a lump on the left side of her chest in the posterior axillary line is been there for 7 years and perhaps is getting larger and painful.  She also says she has 2 collarbones on the left side.   Past Medical History:  Diagnosis Date  . Arthritis   . Back pain   . Bipolar 1 disorder (HCC)   . Cancer (HCC)   . Chronic back pain unk  . Depression   . Panic attacks   . Sciatica   . Seizures [facility](HCC)     Patient Active Problem List   Diagnosis Date Noted  . Postictal state (HCC) 11/13/2017  . 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 Seizure (HCC) 03/24/2016  . Substance induced mood disorder (HCC) 03/05/2016  . Opioid use disorder, severe, dependence (HCC) 02/25/2016  . Cocaine use disorder, moderate, dependence (HCC) 02/25/2016  . Tobacco use disorder 02/25/2016  . Opioid-induced mood disorder (HCC) 02/25/2016  . Cannabis use disorder, moderate, dependence (HCC) 02/25/2016    Past Surgical History:  Procedure Laterality Date  . ABDOMINAL SURGERY    . EYE SURGERY      Prior to Admission medications   Medication Sig Start Date End Date Taking? Authorizing Provider  cyclobenzaprine  (FLEXERIL) 5 MG tablet Take 1 tablet (5 mg total) by mouth 3 (three) times daily as needed for muscle spasms. Patient not taking: Reported on 05/24/2018 04/12/18   Menshew, Charlesetta IvoryJenise V Bacon, PA-C  diphenhydrAMINE (BENADRYL) 25 MG tablet Take 25 mg by mouth every 6 (six) hours as needed for allergies.     [provider]  gabapentin (NEURONTIN) 600 MG tablet Take 600 mg by mouth 4 (four) times daily.    [provider]  ketorolac (TORADOL) 10 MG tablet Take 1 tablet (10 mg total) by mouth every 8 (eight) hours. Patient not taking: Reported on 05/24/2018 04/12/18   Menshew, Charlesetta IvoryJenise V Bacon, PA-C  levETIRAcetam (KEPPRA) 500 MG tablet Take 1 tablet (500 mg total) by mouth 2 (two) times daily. Patient not taking: Reported on 05/24/2018 03/01/18   Merrily Brittleifenbark, Neil, MD  mirtazapine (REMERON) 15 MG tablet Take 1 tablet (15 mg total) at bedtime by mouth. Patient not taking: Reported on 11/12/2017 07/08/17 08/07/17  Clapacs, Jackquline DenmarkJohn T, MD  phenytoin (DILANTIN) 100 MG ER capsule Take 1 capsule (100 mg total) by mouth 3 (three) times daily. Patient not taking: Reported on 05/24/2018 11/12/17 02/10/18  Dionne BucySiadecki, Sebastian, MD    Allergies Cyclobenzaprine and Naproxen sodium  Family History  Problem Relation Age of Onset  . Alzheimer's disease Mother   . AAA (abdominal aortic aneurysm) Father   .  Breast cancer Sister 31    Social History Social History   Tobacco Use  . Smoking status: Current Every Day Smoker    Packs/day: 0.50    Types: Cigarettes    Last attempt to quit: 08/27/2014    Years since quitting: 4.0  . Smokeless tobacco: Never Used  Substance Use Topics  . Alcohol use: No  . Drug use: Yes    Types: Cocaine, Marijuana    Review of Systems  Constitutional: No fever/chills Eyes: No visual changes. ENT: No sore throat. Cardiovascular: Denies chest pain.  There is pain in the lump as described in HPI Respiratory: Denies shortness of breath. Gastrointestinal: No abdominal pain.   No nausea, no vomiting.  No diarrhea.  No constipation. Genitourinary: Negative for dysuria. Musculoskeletal: Negative for back pain. Skin: Negative for rash. Neurological: Negative for headaches, focal weakness  ____________________________________________   PHYSICAL EXAM:  VITAL SIGNS: ED Triage Vitals  Enc Vitals Group     BP 08/31/18 1540 (!) 139/108     Pulse Rate 08/31/18 1540 91     Resp 08/31/18 1540 16     Temp 08/31/18 1540 98.1 F (36.7 C)     Temp Source 08/31/18 1540 Oral     SpO2 08/31/18 1540 98 %     Weight 08/31/18 1542 140 lb (63.5 kg)     Height 08/31/18 1542 5\' 3"  (1.6 m)     Head Circumference --      Peak Flow --      Pain Score 08/31/18 1541 7     Pain Loc --      Pain Edu? --      Excl. in GC? --     Constitutional: Alert and oriented.  Tearful Eyes: Conjunctivae are normal Head: Atraumatic. Nose: No congestion/rhinnorhea. Mouth/Throat: Mucous membranes are moist.  Oropharynx non-erythematous. Neck: No stridor.  Cardiovascular: Normal rate, regular rhythm. Grossly normal heart sounds.  Good peripheral circulation. Respiratory: Normal respiratory effort.  No retractions. Lungs CTAB.  There is a soft tissue mass about 6 cm in diameter that is soft and mobile in the left posterior axillary line just below the shoulder Gastrointestinal: Soft and nontender. No distention. No abdominal bruits. No CVA tenderness. Musculoskeletal: No lower extremity tenderness nor edema.  No joint effusions. Neurologic:  Normal speech and language. No gross focal neurologic deficits are appreciated. No gait instability. Skin:  Skin is warm, dry and intact. No rash noted. Psychiatric: Mood and affect are depressed patient is tearful  ____________________________________________   LABS (all labs ordered are listed, but only abnormal results are displayed)  Labs Reviewed  COMPREHENSIVE METABOLIC PANEL - Abnormal; Notable for the following components:      Result Value     Sodium 134 (*)    Glucose, Bld 117 (*)    BUN <5 (*)    Creatinine, Ser 1.12 (*)    Total Protein 9.0 (*)    Albumin 5.1 (*)    GFR calc non Af Amer 54 (*)    All other components within normal limits  ACETAMINOPHEN LEVEL - Abnormal; Notable for the following components:   Acetaminophen (Tylenol), Serum <10 (*)    All other components within normal limits  CBC - Abnormal; Notable for the following components:   RBC 5.28 (*)    Hemoglobin 16.2 (*)    HCT 50.8 (*)    Platelets 425 (*)    All other components within normal limits  URINE DRUG SCREEN, QUALITATIVE ([facility] ONLY) - Abnormal;  Notable for the following components:   Cannabinoid 50 Ng, Ur [place] POSITIVE (*)    All other components within normal limits  C DIFFICILE QUICK SCREEN W PCR REFLEX  GASTROINTESTINAL PANEL BY PCR, STOOL (REPLACES STOOL CULTURE)  ETHANOL  SALICYLATE LEVEL  PHENYTOIN LEVEL, FREE AND TOTAL   ____________________________________________  EKG   ____________________________________________  RADIOLOGY  ED MD interpretation: X-Chandler read by radiology reviewed by me shows a left cervical rib with pseudoarthrosis nothing really except for soft tissue shadow in the area of the mass.  Official radiology report(s): Dg Chest 2 View  Result Date: 08/31/2018 CLINICAL DATA:  Soft tissue mass at LEFT posterior axillary line EXAM: CHEST - 2 VIEW COMPARISON:  None FINDINGS: Normal heart size, mediastinal contours, and pulmonary vascularity. Emphysematous and bronchitic changes consistent with COPD. No infiltrate, pleural effusion or pneumothorax. Old healed fracture of the posterior RIGHT sixth rib. Bones appear demineralized. LEFT cervical rib with pseudoarthrosis. No acute osseous findings. IMPRESSION: COPD changes. At no acute abnormalities. Electronically Signed   By: Ulyses SouthwardMark  Boles M.D.   On: 08/31/2018 17:10    ____________________________________________   PROCEDURES  Procedure(s) performed:    Procedures  Critical Care performed:   ____________________________________________   INITIAL IMPRESSION / ASSESSMENT AND PLAN / ED COURSE  The mass at the posterior axillary line on the left is soft mobile not very tender it seems most likely to represent a fatty tumor or lipoma  Patient feels depressed, she wants help.  She is willing to stay.  We will see if we can get tele-psychiatry to see her.       ____________________________________________   FINAL CLINICAL IMPRESSION(S) / ED DIAGNOSES  Final diagnoses:  Suicidal ideation  Depression, unspecified depression type     ED Discharge Orders    None       Note:  This document was prepared using Dragon voice recognition software and may include unintentional dictation errors.    Arnaldo NatalMalinda, Leeam Cedrone F, MD 09/01/18 [phone]

## 2018-08-31 NOTE — ED Notes (Signed)
Called [facility] for consult [phone]

## 2018-09-01 ENCOUNTER — Encounter: Payer: Self-pay | Admitting: Psychiatry

## 2018-09-01 ENCOUNTER — Inpatient Hospital Stay
Admission: AD | Admit: 2018-09-01 | Discharge: 2018-09-03 | DRG: 885 | Disposition: A | Discharge status: Home / Self Care | Payer: Medicaid Other | Source: Intra-hospital | Attending: Psychiatry | Admitting: Psychiatry

## 2018-09-01 DIAGNOSIS — F1220 Cannabis dependence, uncomplicated: Secondary | ICD-10-CM | POA: Diagnosis present

## 2018-09-01 DIAGNOSIS — F322 Major depressive disorder, single episode, severe without psychotic features: Secondary | ICD-10-CM | POA: Diagnosis present

## 2018-09-01 DIAGNOSIS — Z9114 Patient's other noncompliance with medication regimen: Secondary | ICD-10-CM | POA: Diagnosis not present

## 2018-09-01 DIAGNOSIS — F17210 Nicotine dependence, cigarettes, uncomplicated: Secondary | ICD-10-CM | POA: Diagnosis present

## 2018-09-01 DIAGNOSIS — G8929 Other chronic pain: Secondary | ICD-10-CM | POA: Diagnosis present

## 2018-09-01 DIAGNOSIS — M1990 Unspecified osteoarthritis, unspecified site: Secondary | ICD-10-CM | POA: Diagnosis present

## 2018-09-01 DIAGNOSIS — R45851 Suicidal ideations: Secondary | ICD-10-CM | POA: Diagnosis present

## 2018-09-01 DIAGNOSIS — M5430 Sciatica, unspecified side: Secondary | ICD-10-CM | POA: Diagnosis present

## 2018-09-01 DIAGNOSIS — F332 Major depressive disorder, recurrent severe without psychotic features: Secondary | ICD-10-CM | POA: Diagnosis present

## 2018-09-01 DIAGNOSIS — Z91138 Patient's unintentional underdosing of medication regimen for other reason: Secondary | ICD-10-CM

## 2018-09-01 DIAGNOSIS — F329 Major depressive disorder, single episode, unspecified: Secondary | ICD-10-CM | POA: Diagnosis not present

## 2018-09-01 DIAGNOSIS — Z888 Allergy status to other drugs, medicaments and biological substances status: Secondary | ICD-10-CM | POA: Diagnosis not present

## 2018-09-01 DIAGNOSIS — Z813 Family history of other psychoactive substance abuse and dependence: Secondary | ICD-10-CM

## 2018-09-01 DIAGNOSIS — Z818 Family history of other mental and behavioral disorders: Secondary | ICD-10-CM

## 2018-09-01 DIAGNOSIS — Z803 Family history of malignant neoplasm of breast: Secondary | ICD-10-CM

## 2018-09-01 DIAGNOSIS — G40909 Epilepsy, unspecified, not intractable, without status epilepticus: Secondary | ICD-10-CM | POA: Diagnosis present

## 2018-09-01 DIAGNOSIS — Z820 Family history of epilepsy and other diseases of the nervous system: Secondary | ICD-10-CM | POA: Diagnosis not present

## 2018-09-01 DIAGNOSIS — R569 Unspecified convulsions: Secondary | ICD-10-CM

## 2018-09-01 DIAGNOSIS — F17200 Nicotine dependence, unspecified, uncomplicated: Secondary | ICD-10-CM | POA: Diagnosis present

## 2018-09-01 LAB — PREGNANCY, URINE: Preg Test, Ur: NEGATIVE

## 2018-09-01 MED ORDER — LOPERAMIDE HCL 2 MG PO CAPS: 2.0000 mg | ORAL_CAPSULE | ORAL | Status: DC | PRN

## 2018-09-01 MED ORDER — ALUM & MAG HYDROXIDE-SIMETH 200-200-20 MG/5ML PO SUSP: 30.0000 mL | ORAL | Status: DC | PRN

## 2018-09-01 MED ORDER — MAGNESIUM HYDROXIDE 400 MG/5ML PO SUSP: 30.0000 mL | Freq: Every day | ORAL | Status: DC | PRN

## 2018-09-01 MED ORDER — LEVETIRACETAM 500 MG PO TABS
500.0000 mg | ORAL_TABLET | Freq: Two times a day (BID) | ORAL | Status: DC
  Administered 2018-09-01 (×2): 500 mg via ORAL
  Filled 2018-09-01 (×4): qty 1

## 2018-09-01 MED ORDER — ACETAMINOPHEN 325 MG PO TABS
650.0000 mg | ORAL_TABLET | Freq: Four times a day (QID) | ORAL | Status: DC | PRN
  Administered 2018-09-02: 650 mg via ORAL
  Filled 2018-09-01: qty 2

## 2018-09-01 MED ORDER — PHENYTOIN SODIUM EXTENDED 100 MG PO CAPS
300.0000 mg | ORAL_CAPSULE | Freq: Every day | ORAL | Status: DC
  Administered 2018-09-02 – 2018-09-03 (×2): 300 mg via ORAL
  Filled 2018-09-01 (×2): qty 3

## 2018-09-01 MED ORDER — MIRTAZAPINE 15 MG PO TABS
15.0000 mg | ORAL_TABLET | Freq: Every day | ORAL | Status: DC
  Administered 2018-09-01 – 2018-09-02 (×2): 15 mg via ORAL
  Filled 2018-09-01 (×2): qty 1

## 2018-09-01 MED ORDER — CLONIDINE HCL 0.1 MG PO TABS
0.1000 mg | ORAL_TABLET | Freq: Three times a day (TID) | ORAL | Status: DC
  Administered 2018-09-01 – 2018-09-02 (×3): 0.1 mg via ORAL
  Filled 2018-09-01 (×3): qty 1

## 2018-09-01 MED ORDER — ACETAMINOPHEN 500 MG PO TABS
1000.0000 mg | ORAL_TABLET | Freq: Once | ORAL | Status: AC
  Administered 2018-09-01: 1000 mg via ORAL
  Filled 2018-09-01: qty 2

## 2018-09-01 MED ORDER — ONDANSETRON 4 MG PO TBDP
4.0000 mg | ORAL_TABLET | Freq: Three times a day (TID) | ORAL | Status: DC | PRN
  Administered 2018-09-01: 4 mg via ORAL
  Filled 2018-09-01: qty 1

## 2018-09-01 MED ORDER — GABAPENTIN 400 MG PO CAPS
400.0000 mg | ORAL_CAPSULE | Freq: Four times a day (QID) | ORAL | Status: DC
  Administered 2018-09-01 – 2018-09-02 (×4): 400 mg via ORAL
  Filled 2018-09-01 (×4): qty 1

## 2018-09-01 MED ORDER — IBUPROFEN 600 MG PO TABS
600.0000 mg | ORAL_TABLET | Freq: Four times a day (QID) | ORAL | Status: DC | PRN
  Administered 2018-09-01 – 2018-09-02 (×2): 600 mg via ORAL
  Filled 2018-09-01 (×2): qty 1

## 2018-09-01 MED ORDER — DULOXETINE HCL 30 MG PO CPEP
30.0000 mg | ORAL_CAPSULE | Freq: Every day | ORAL | Status: DC
  Administered 2018-09-01: 30 mg via ORAL
  Filled 2018-09-01 (×2): qty 1

## 2018-09-01 MED ORDER — ONDANSETRON 4 MG PO TBDP
4.0000 mg | ORAL_TABLET | Freq: Once | ORAL | Status: AC
  Administered 2018-09-01: 4 mg via ORAL
  Filled 2018-09-01: qty 1

## 2018-09-01 MED ORDER — PHENYTOIN SODIUM EXTENDED 100 MG PO CAPS
300.0000 mg | ORAL_CAPSULE | Freq: Once | ORAL | Status: AC
  Administered 2018-09-02: 300 mg via ORAL
  Filled 2018-09-01: qty 3

## 2018-09-01 MED ORDER — GABAPENTIN 600 MG PO TABS
600.0000 mg | ORAL_TABLET | Freq: Four times a day (QID) | ORAL | Status: DC
  Administered 2018-09-01 (×2): 600 mg via ORAL
  Filled 2018-09-01 (×2): qty 1

## 2018-09-01 MED ORDER — QUETIAPINE FUMARATE 100 MG PO TABS
100.0000 mg | ORAL_TABLET | Freq: Every day | ORAL | Status: DC
  Administered 2018-09-01: 100 mg via ORAL
  Filled 2018-09-01: qty 1

## 2018-09-01 MED ORDER — ACETAMINOPHEN 325 MG PO TABS: 650.0000 mg | ORAL_TABLET | Freq: Four times a day (QID) | ORAL | Status: DC | PRN

## 2018-09-01 NOTE — ED Notes (Signed)
Pt sleeping. 

## 2018-09-01 NOTE — ED Notes (Signed)
Pt being reviewed for possible admission to [facility].  Clinical information has been provided to the [facility] Unit for the charge nurse to review and provide bed assignment.

## 2018-09-01 NOTE — ED Notes (Signed)
Pt pushed call light in room. This tech went to the door. Pt was apologizing for her actions. Tech informed pt that RN is getting pt meds and will be there to see her shortly.

## 2018-09-01 NOTE — ED Notes (Signed)
Hourly rounding reveals patient sleeping in room. No complaints, stable, in no acute distress. Q15 minute rounds and monitoring via Security Cameras to continue. 

## 2018-09-01 NOTE — [facility] Group Notes (Signed)
[facility] Group Notes:  (Nursing/MHT/Case Management/Adjunct)  Date:  09/01/2018  Time:  2:25 PM  Type of Therapy:  Psychoeducational Skills  Participation Level:  Did Not Attend    Nicole Chandler 09/01/2018, 2:25 PM

## 2018-09-01 NOTE — ED Notes (Signed)
Hourly rounding reveals patient in room. Stable, in no acute distress. Q15 minute rounds and monitoring via Security Cameras to continue. 

## 2018-09-01 NOTE — BH Assessment (Signed)
Assessment Note  Nicole Chandler is an 58 y.o. female.  patient with a history as noted below who reports to the emergency department voluntarily with complaints of Suicidal Thoughts. Per her report her Suicidal houghts began approximately 4 days ago. Patient is tearful throughout the duration of the assessment and states that she lost her son in May of last year. She could not contract for safety outside of the hospital system. Patient reports that she's actively suicidal but has no plan at this time. She also reports exacerbating factors including chronic pain and lack of support. She reports that she could not contract from safety outside for hospital system.  Patient endorse is visual hallucination and states that she sees spiders. Per her report she has not experienced hallucinations in some time, now they have returned. She reports a history of substance abuse. She shared that she's been sober from crack cocaine for approximately 8 years. Patient is asked to use of any mood-altering substances.     Diagnosis: Adjustment disorder with mixed disturbance of emotions and conduct  Past Medical History:  Past Medical History:  Diagnosis Date  . Arthritis   . Back pain   . Bipolar 1 disorder (HCC)   . Cancer (HCC)   . Chronic back pain unk  . Depression   . Panic attacks   . Sciatica   . Seizures (HCC)     Past Surgical History:  Procedure Laterality Date  . ABDOMINAL SURGERY    . EYE SURGERY      Family History:  Family History  Problem Relation Age of Onset  . Alzheimer's disease Mother   . AAA (abdominal aortic aneurysm) Father   . Breast cancer Sister 70    Social History:  reports that she has been smoking cigarettes. She has been smoking about 0.50 packs per day. She has never used smokeless tobacco. She reports current drug use. Drugs: Cocaine and Marijuana. She reports that she does not drink alcohol.  Additional Social History:  Alcohol / Drug Use Pain Medications: SEE  MAR Prescriptions: SEE MAR Over the Counter: SEE MAR History of alcohol / drug use?: Yes Longest period of sobriety (when/how long): sober 8 years   CIWA: CIWA-Ar BP: (!) 139/108 Pulse Rate: 91 COWS:    Allergies:  Allergies  Allergen Reactions  . Cyclobenzaprine     "makes the pt jumpy"  . Naproxen Sodium     Intestinal bloating that was so bad she had to go to the ER    Home Medications: (Not in a hospital admission)   OB/GYN Status:  Patient's last menstrual period was 12/17/2007 (approximate).  General Assessment Data Location of Assessment: [facility] ED TTS Assessment: In system Is this a Tele or Face-to-Face Assessment?: Face-to-Face Is this an Initial Assessment or a Re-assessment for this encounter?: Initial Assessment Patient Accompanied by:: N/A Language Other than English: No Living Arrangements: Other (Comment) What gender do you identify as?: Female Marital status: Single Living Arrangements: Spouse/significant other Can pt return to current living arrangement?: Yes Admission Status: Voluntary Is patient capable of signing voluntary admission?: Yes Referral Source: Self/Family/Friend Insurance type: none     Crisis Care Plan Living Arrangements: Spouse/significant other Name of Psychiatrist: none Name of Therapist: none  Education Status Is patient currently in school?: No Is the patient employed, unemployed or receiving disability?: Unemployed  Risk to self with the past 6 months Suicidal Ideation: Yes-Currently Present Has patient been a risk to self within the past 6 months prior  to admission? : Yes Suicidal Intent: No Has patient had any suicidal intent within the past 6 months prior to admission? : No Is patient at risk for suicide?: Yes Suicidal Plan?: No Has patient had any suicidal plan within the past 6 months prior to admission? : No What has been your use of drugs/alcohol within the last 12 months?: none Previous Attempts/Gestures:  No How many times?: 0 Intentional Self Injurious Behavior: None Family Suicide History: No Recent stressful life event(s): Loss (Comment), Recent negative physical changes Persecutory voices/beliefs?: No Depression: Yes Depression Symptoms: Insomnia, Despondent, Loss of interest in usual pleasures, Feeling worthless/self pity, Tearfulness Substance abuse history and/or treatment for substance abuse?: Yes Suicide prevention information given to non-admitted patients: Not applicable  Risk to Others within the past 6 months Homicidal Ideation: No Does patient have any lifetime risk of violence toward others beyond the six months prior to admission? : No Thoughts of Harm to Others: No Current Homicidal Intent: No Current Homicidal Plan: No Identified Victim: none History of harm to others?: No Assessment of Violence: None Noted Violent Behavior Description: none Does patient have access to weapons?: No Criminal Charges Pending?: No Is patient on probation?: No  Psychosis Hallucinations: Visual Delusions: None noted  Mental Status Report Appearance/Hygiene: In scrubs Eye Contact: Poor Motor Activity: Freedom of movement Speech: Pressured Level of Consciousness: Alert Mood: Anxious, Despair Affect: Anxious, Depressed Anxiety Level: Minimal Thought Processes: Coherent Judgement: Impaired Orientation: Time, Person, Place, Situation Obsessive Compulsive Thoughts/Behaviors: None  Cognitive Functioning Concentration: Poor Memory: Recent Intact, Remote Intact Is patient IDD: No Insight: Poor Impulse Control: Poor Appetite: Fair Have you had any weight changes? : No Change Sleep: No Change Total Hours of Sleep: 8 Vegetative Symptoms: None  ADLScreening [facility] Assessment Services) Patient's cognitive ability adequate to safely complete daily activities?: Yes Patient able to express need for assistance with ADLs?: Yes Independently performs ADLs?: Yes (appropriate for  developmental age)  Prior Inpatient Therapy Prior Inpatient Therapy: No  Prior Outpatient Therapy Prior Outpatient Therapy: No Does patient have an ACCT team?: No Does patient have Intensive In-House Services?  : No Does patient have [facility] services? : No Does patient have P4CC services?: No  ADL Screening (condition at time of admission) Patient's cognitive ability adequate to safely complete daily activities?: Yes Patient able to express need for assistance with ADLs?: Yes Independently performs ADLs?: Yes (appropriate for developmental age)       Abuse/Neglect Assessment (Assessment to be complete while patient is alone) Abuse/Neglect Assessment Can Be Completed: Yes Physical Abuse: Denies Verbal Abuse: Denies Sexual Abuse: Denies Exploitation of patient/patient's resources: Denies Self-Neglect: Denies Values / Beliefs Cultural Requests During Hospitalization: None Spiritual Requests During Hospitalization: None Consults Spiritual Care Consult Needed: No Social Work Consult Needed: No Merchant navy officer (For Healthcare) Does Patient Have a Medical Advance Directive?: No          Disposition:  Disposition Initial Assessment Completed for this Encounter: Yes  On Site Evaluation by:   Reviewed with Physician:    Asa Saunas 09/01/2018 5:00 AM

## 2018-09-01 NOTE — ED Notes (Signed)
Patient observed lying in bed with eyes closed  Even, unlabored respirations observed   NAD pt appears to be sleeping  I will continue to monitor along with every 15 minute visual observations and ongoing security camera monitoring    

## 2018-09-01 NOTE — ED Notes (Signed)
ED BHU PLACEMENT JUSTIFICATION Is the patient under IVC or is there intent for IVC: Yes.   Is the patient medically cleared: Yes.   Is there vacancy in the ED BHU: Yes.   Is the population mix appropriate for patient: Yes.   Is the patient awaiting placement in inpatient or outpatient setting: Yes.   Has the patient had a psychiatric consult: Yes.   Survey of unit performed for contraband, proper placement and condition of furniture, tampering with fixtures in bathroom, shower, and each patient room: Yes.  ; Findings:  APPEARANCE/BEHAVIOR Calm and cooperative NEURO ASSESSMENT Orientation: oriented x3  Denies pain Hallucinations: No.None noted (Hallucinations) Speech: Normal Gait: normal RESPIRATORY ASSESSMENT Even  Unlabored respirations  CARDIOVASCULAR ASSESSMENT Pulses equal   regular rate  Skin warm and dry   GASTROINTESTINAL ASSESSMENT no GI complaint EXTREMITIES Full ROM  PLAN OF CARE Provide calm/safe environment. Vital signs assessed twice daily. ED BHU Assessment once each 12-hour shift. Collaborate with TTS  daily or as condition indicates. Assure the ED provider has rounded once each shift. Provide and encourage hygiene. Provide redirection as needed. Assess for escalating behavior; address immediately and inform ED provider.  Assess family dynamic and appropriateness for visitation as needed: Yes.  ; If necessary, describe findings:  Educate the patient/family about BHU procedures/visitation: Yes.  ; If necessary, describe findings:   

## 2018-09-01 NOTE — ED Notes (Signed)
Pt loud and fussing/cussing with pt in another room because the bathroom is dirty and she could not use it. Bathroom has been cleaned by RN and pt informed that the bathroom is clean. Pt threatening to hit this tech, standing in my face telling me to say something. Pt informed she needed to back up. Pt went back to her bed. This tech left pt alone.

## 2018-09-01 NOTE — ED Notes (Signed)
BEHAVIORAL HEALTH ROUNDING Patient sleeping: No. Patient alert and oriented: yes Behavior appropriate: Yes.  ; If no, describe:  Nutrition and fluids offered: yes Toileting and hygiene offered: Yes  Sitter present: q15 minute observations and security camera monitoring Law enforcement present: Yes  ODS  

## 2018-09-01 NOTE — BHH Group Notes (Signed)
LCSW Group Therapy Note  09/01/2018 1:00 PM  Type of Therapy/Topic:  Group Therapy:  Emotion Regulation  Participation Level:  Active   Description of Group:   The purpose of this group is to assist patients in learning to regulate negative emotions and experience positive emotions. Patients will be guided to discuss ways in which they have been vulnerable to their negative emotions. These vulnerabilities will be juxtaposed with experiences of positive emotions or situations, and patients will be challenged to use positive emotions to combat negative ones. Special emphasis will be placed on coping with negative emotions in conflict situations, and patients will process healthy conflict resolution skills.  Therapeutic Goals: 1. Patient will identify two positive emotions or experiences to reflect on in order to balance out negative emotions 2. Patient will label two or more emotions that they find the most difficult to experience 3. Patient will demonstrate positive conflict resolution skills through discussion and/or role plays  Summary of Patient Progress: Patient was an active participant in group.  Patient was supportive of other group members.  Patient was able to identify recent negative emotions that she has felt.  Patient did become emotional when she mentioned the recent passing of her son in May.  CSW recommended the client follow up with Hospice for grief counseling and pt reports that she already has an appointment scheduled.  Patient was able to be redirected and was fully participating in group, identifying positive emotions and positive copings skills she can increase to increase her positive emotions.     Therapeutic Modalities:   Cognitive Behavioral Therapy Feelings Identification Dialectical Behavioral Therapy  Penni Homans, MSW, LCSW 09/01/2018 2:56 PM

## 2018-09-01 NOTE — Progress Notes (Signed)
[facility] MD Progress Note  09/01/2018 10:35 AM Nicole Chandler  MRN:  [id number] Subjective:  "I have had so much loss I had so many people die around me, I just did not know what else to do.  My doctor and the crisis team thought I needed to come here."  Principal Problem: Suicidal thoughts Diagnosis: Principal Problem:   Suicidal thoughts Active Problems:   Adjustment disorder with mixed disturbance of emotions and conduct   Major depressive disorder, recurrent severe without psychotic features (HCC)  Total Time spent with patient: 35 minutes  Past Psychiatric History: Nicole Chandler is a 58 y.o. female who presented to the emergency department with suicidal ideation for the past 4 days after the death of her son in 01/04/2018. Past  Patient reports the death of loved ones starting at the age of 77 with her best friend in a car accident, her favorite uncle to sudden heart attack at church.  Patient states she was present at the times of these deaths.  She reports that she felt overwhelmed, after she went to the blood bank to donate plasma and was told she had hepatitis.  She states she went to her primary care provider and was tested and told that she did not.  She got a letter in order to donate plasma, which is her only source of income at this time and was denied  Patient endorsed visual hallucinations, seeing spiders at presentation to the emergency room.  She denies auditory or visual hallucinations at this time.  Patient reports that she has had poor sleep, poor appetite, decreased interest in usual activities, low energy, and most recently feelings of hopelessness.  She continues to have passive thoughts of suicide and wishing she was dead, however she denies any specific intent or plan.  Patient denies homicidal ideation.  Patient states that she is awaiting to hear about the decision for her to be on disability due to chronic pain and arthritis.  She states that she should find out on March 4.   Until that time, she feels stressed about her finances.  Regarding her son and her grieving, she states that she will be starting hospice with her sister-in-law and has an appointment set for a class on January 20.  Patient endorses marijuana use 2-3 times a week in order to increase her appetite.  She states that she has been hospitalized at [facility] approximately 6 to 8 years ago for substance use detox.  Patient reports that she has previous addiction to crack cocaine, but has not used in 8 years.  Patient is a primary care provider is Jamal Collin, who prescribes patient's pain medication.  Patient reports that she takes duloxetine 30 mg 3 times a day for mood and pain disorder.  She does not find it effective for either.  Medical History:  Past Medical History:  Diagnosis Date  . Arthritis   . Back pain   . Bipolar 1 disorder (HCC)   . Cancer (HCC)   . Chronic back pain unk  . Depression   . Panic attacks   . Sciatica   . Seizures (HCC)     Past Surgical History:  Procedure Laterality Date  . ABDOMINAL SURGERY    . EYE SURGERY     Family History:  Family History  Problem Relation Age of Onset  . Alzheimer's disease Mother   . AAA (abdominal aortic aneurysm) Father   . Breast cancer Sister 12   Family Psychiatric  History: Denies Social History:  Social History   Substance and Sexual Activity  Alcohol Use No     Social History   Substance and Sexual Activity  Drug Use Yes  . Types: Cocaine, Marijuana    Social History   Socioeconomic History  . Marital status: Single    Spouse name: Not on file  . Number of children: Not on file  . Years of education: Not on file  . Highest education level: Not on file  Occupational History  . Not on file  Social Needs  . Financial resource Chandler: Not on file  . Food insecurity:    Worry: Not on file    Inability: Not on file  . Transportation needs:    Medical: Not on file    Non-medical: Not on file  Tobacco Use  .  Smoking status: Current Every Day Smoker    Packs/day: 0.50    Types: Cigarettes    Last attempt to quit: 08/27/2014    Years since quitting: 4.0  . Smokeless tobacco: Never Used  Substance and Sexual Activity  . Alcohol use: No  . Drug use: Yes    Types: Cocaine, Marijuana  . Sexual activity: Not Currently  Lifestyle  . Physical activity:    Days per week: Not on file    Minutes per session: Not on file  . Stress: Not on file  Relationships  . Social connections:    Talks on phone: Not on file    Gets together: Not on file    Attends religious service: Not on file    Active member of club or organization: Not on file    Attends meetings of clubs or organizations: Not on file    Relationship status: Not on file  Other Topics Concern  . Not on file  Social History Narrative   ** Merged History Encounter **       Additional Social History: Lives alone, however has stressors regarding finances now that she cannot donate plasma.   Pain Medications: SEE MAR Prescriptions: SEE MAR Over the Counter: SEE MAR History of alcohol / drug use?: Yes Longest period of sobriety (when/how long): sober 8 years                     Sleep: Poor  Appetite:  Poor  Current Medications: Current Facility-Administered Medications  Medication Dose Route Frequency Provider Last Rate Last Dose  . DULoxetine (CYMBALTA) DR capsule 30 mg  30 mg Oral QHS Loleta RoseForbach, Cory, MD   30 mg at 09/01/18 0116  . gabapentin (NEURONTIN) tablet 600 mg  600 mg Oral QID Loleta RoseForbach, Cory, MD   600 mg at 09/01/18 0116  . levETIRAcetam (KEPPRA) tablet 500 mg  500 mg Oral BID Loleta RoseForbach, Cory, MD   500 mg at 09/01/18 0116  . nicotine (NICODERM CQ - dosed in mg/24 hours) patch 21 mg  21 mg Transdermal Once Arnaldo NatalMalinda, Paul F, MD   21 mg at 08/31/18 2119   Current Outpatient Medications  Medication Sig Dispense Refill  . cyclobenzaprine (FLEXERIL) 5 MG tablet Take 1 tablet (5 mg total) by mouth 3 (three) times daily as  needed for muscle spasms. (Patient not taking: Reported on 05/24/2018) 15 tablet 0  . diphenhydrAMINE (BENADRYL) 25 MG tablet Take 25 mg by mouth every 6 (six) hours as needed for allergies.     Marland Kitchen. gabapentin (NEURONTIN) 600 MG tablet Take 600 mg by mouth 4 (four) times daily.    .Marland Kitchen  ketorolac (TORADOL) 10 MG tablet Take 1 tablet (10 mg total) by mouth every 8 (eight) hours. (Patient not taking: Reported on 05/24/2018) 15 tablet 0  . levETIRAcetam (KEPPRA) 500 MG tablet Take 1 tablet (500 mg total) by mouth 2 (two) times daily. (Patient not taking: Reported on 05/24/2018) 60 tablet 0  . mirtazapine (REMERON) 15 MG tablet Take 1 tablet (15 mg total) at bedtime by mouth. (Patient not taking: Reported on 11/12/2017) 30 tablet 0  . phenytoin (DILANTIN) 100 MG ER capsule Take 1 capsule (100 mg total) by mouth 3 (three) times daily. (Patient not taking: Reported on 05/24/2018) 90 capsule 2    Lab Results:  Results for orders placed or performed during the hospital encounter of 08/31/18 (from the past 48 hour(s))  Comprehensive metabolic panel     Status: Abnormal   Collection Time: 08/31/18  3:48 PM  Result Value Ref Range   Sodium 134 (L) 135 - 145 mmol/L   Potassium 4.3 3.5 - 5.1 mmol/L    Comment: HEMOLYSIS AT THIS LEVEL MAY AFFECT RESULT   Chloride 102 98 - 111 mmol/L   CO2 23 22 - 32 mmol/L   Glucose, Bld 117 (H) 70 - 99 mg/dL   BUN <5 (L) 6 - 20 mg/dL   Creatinine, Ser 4.09 (H) 0.44 - 1.00 mg/dL   Calcium 81.1 8.9 - 91.4 mg/dL   Total Protein 9.0 (H) 6.5 - 8.1 g/dL   Albumin 5.1 (H) 3.5 - 5.0 g/dL   AST 24 15 - 41 U/L   ALT <5 0 - 44 U/L   Alkaline Phosphatase 118 38 - 126 U/L   Total Bilirubin 0.7 0.3 - 1.2 mg/dL   GFR calc non Af Amer 54 (L) >60 mL/min   GFR calc Af Amer >60 >60 mL/min   Anion gap 9 5 - 15    Comment: Performed at [facility], [address] [place], [place] [address]  Ethanol     Status: None   Collection Time: 08/31/18  3:48 PM  Result Value Ref Range    Alcohol, Ethyl (B) <10 <10 mg/dL    Comment: (NOTE) Lowest detectable limit for serum alcohol is 10 mg/dL. For medical purposes only. Performed at [facility], [address] [place], [place] [address]   Salicylate level     Status: None   Collection Time: 08/31/18  3:48 PM  Result Value Ref Range   Salicylate Lvl <7.0 2.8 - 30.0 mg/dL    Comment: Performed at [facility], [address] [place], [place] [address]  Acetaminophen level     Status: Abnormal   Collection Time: 08/31/18  3:48 PM  Result Value Ref Range   Acetaminophen (Tylenol), Serum <10 (L) 10 - 30 ug/mL    Comment: (NOTE) Therapeutic concentrations vary significantly. A range of 10-30 ug/mL  may be an effective concentration for many patients. However, some  are best treated at concentrations outside of this range. Acetaminophen concentrations >150 ug/mL at 4 hours after ingestion  and >50 ug/mL at 12 hours after ingestion are often associated with  toxic reactions. Performed at [facility], [address] [place], [place] [address]   cbc     Status: Abnormal   Collection Time: 08/31/18  3:48 PM  Result Value Ref Range   WBC 9.2 4.0 - 10.5 K/uL   RBC 5.28 (H) 3.87 - 5.11 MIL/uL   Hemoglobin 16.2 (H) 12.0 - 15.0 g/dL   HCT 95.2 (H) 84.1 -  46.0 %   MCV 96.2 80.0 - 100.0 fL   MCH 30.7 26.0 - 34.0 pg   MCHC 31.9 30.0 - 36.0 g/dL   RDW 16.112.6 09.611.5 - 04.515.5 %   Platelets 425 (H) 150 - 400 K/uL   nRBC 0.0 0.0 - 0.2 %    Comment: Performed at [facility], [address] [place], [place] [address]  Urine Drug Screen, Qualitative     Status: Abnormal   Collection Time: 08/31/18  3:49 PM  Result Value Ref Range   Tricyclic, Ur Screen NONE DETECTED NONE DETECTED   Amphetamines, Ur Screen NONE DETECTED NONE DETECTED   MDMA (Ecstasy)Ur Screen NONE DETECTED NONE DETECTED   Cocaine Metabolite,Ur [place] NONE DETECTED NONE DETECTED   Opiate, Ur Screen NONE DETECTED NONE DETECTED    Phencyclidine (PCP) Ur S NONE DETECTED NONE DETECTED   Cannabinoid 50 Ng, Ur [place] POSITIVE (A) NONE DETECTED   Barbiturates, Ur Screen NONE DETECTED NONE DETECTED   Benzodiazepine, Ur Scrn NONE DETECTED NONE DETECTED   Methadone Scn, Ur NONE DETECTED NONE DETECTED    Comment: (NOTE) Tricyclics + metabolites, urine    Cutoff 1000 ng/mL Amphetamines + metabolites, urine  Cutoff 1000 ng/mL MDMA (Ecstasy), urine              Cutoff 500 ng/mL Cocaine Metabolite, urine          Cutoff 300 ng/mL Opiate + metabolites, urine        Cutoff 300 ng/mL Phencyclidine (PCP), urine         Cutoff 25 ng/mL Cannabinoid, urine                 Cutoff 50 ng/mL Barbiturates + metabolites, urine  Cutoff 200 ng/mL Benzodiazepine, urine              Cutoff 200 ng/mL Methadone, urine                   Cutoff 300 ng/mL The urine drug screen provides only a preliminary, unconfirmed analytical test result and should not be used for non-medical purposes. Clinical consideration and professional judgment should be applied to any positive drug screen result due to possible interfering substances. A more specific alternate chemical method must be used in order to obtain a confirmed analytical result. Gas chromatography / mass spectrometry (GC/MS) is the preferred confirmat ory method. Performed at [facility], [address] [place], [place] [address]   C difficile quick scan w PCR reflex     Status: None   Collection Time: 08/31/18  4:37 PM  Result Value Ref Range   C Diff antigen NEGATIVE NEGATIVE   C Diff toxin NEGATIVE NEGATIVE   C Diff interpretation No C. difficile detected.     Comment: Performed at [facility], [address] [place], [place] [address]  Gastrointestinal Panel by PCR , Stool     Status: None   Collection Time: 08/31/18  4:37 PM  Result Value Ref Range   Campylobacter species NOT DETECTED NOT DETECTED   Plesimonas shigelloides NOT DETECTED NOT DETECTED   Salmonella  species NOT DETECTED NOT DETECTED   Yersinia enterocolitica NOT DETECTED NOT DETECTED   Vibrio species NOT DETECTED NOT DETECTED   Vibrio cholerae NOT DETECTED NOT DETECTED   Enteroaggregative E coli (EAEC) NOT DETECTED NOT DETECTED   Enteropathogenic E coli (EPEC) NOT DETECTED NOT DETECTED   Enterotoxigenic E coli (ETEC) NOT DETECTED NOT DETECTED   Shiga like toxin producing E  coli (STEC) NOT DETECTED NOT DETECTED   Shigella/Enteroinvasive E coli (EIEC) NOT DETECTED NOT DETECTED   Cryptosporidium NOT DETECTED NOT DETECTED   Cyclospora cayetanensis NOT DETECTED NOT DETECTED   Entamoeba histolytica NOT DETECTED NOT DETECTED   Giardia lamblia NOT DETECTED NOT DETECTED   Adenovirus F40/41 NOT DETECTED NOT DETECTED   Astrovirus NOT DETECTED NOT DETECTED   Norovirus GI/GII NOT DETECTED NOT DETECTED   Rotavirus A NOT DETECTED NOT DETECTED   Sapovirus (I, II, IV, and V) NOT DETECTED NOT DETECTED    Comment: Performed at [facility], [address] [place], [place] [address]    Blood Alcohol level:  Lab Results  Component Value Date   [facility] <10 08/31/2018   ETH <10 05/24/2018    Metabolic Disorder Labs: No results found for: HGBA1C, MPG No results found for: PROLACTIN No results found for: CHOL, TRIG, HDL, CHOLHDL, VLDL, LDLCALC  Physical Findings: AIMS:  , ,  ,  ,    CIWA:    COWS:     Musculoskeletal: Strength & Muscle Tone: decreased Gait & Station: normal Patient leans: N/A  Psychiatric Specialty Exam: Physical Exam  Nursing note and vitals reviewed. Constitutional: She is oriented to person, place, and time. She appears well-developed. She appears distressed.  HENT:  Head: Normocephalic and atraumatic.  Missing teeth/broken to exposed root upper left   Eyes: EOM are normal.  Neck: Normal range of motion.  Cardiovascular: Normal rate.  Elevated blood pressure   Respiratory: Effort normal.  Musculoskeletal: Normal range of motion.  Neurological: She is  alert and oriented to person, place, and time.  Skin: Skin is warm and dry.    Review of Systems  Constitutional: Positive for malaise/fatigue and weight loss.  HENT:       Dental pain related to exposed tooth roots left upper teeth.  Respiratory: Negative.   Cardiovascular: Negative.   Gastrointestinal: Positive for abdominal pain, diarrhea and nausea.  Musculoskeletal: Positive for joint pain and myalgias.  Skin: Negative.   Neurological: Positive for headaches. Negative for tremors and weakness.  Psychiatric/Behavioral: Positive for depression, substance abuse (Marijuana use to improve appetite) and suicidal ideas (Passive). Negative for hallucinations and memory loss. The patient is nervous/anxious and has insomnia.     Blood pressure (!) 139/108, pulse 91, temperature 98.1 F (36.7 C), temperature source Oral, resp. rate 16, height 5\' 3"  (1.6 m), weight 63.5 kg, last menstrual period 12/17/2007, SpO2 98 %.Body mass index is 24.8 kg/m.  General Appearance: Disheveled and Guarded  Eye Contact:  Fair  Speech:  Garbled and Normal Rate  Volume:  Normal  Mood:  Anxious, Depressed, Hopeless and Irritable  Affect:  Congruent  Thought Process:  Coherent and Descriptions of Associations: Tangential  Orientation:  Full (Time, Place, and Person)  Thought Content:  Hallucinations: Denies, Rumination and Tangential  Suicidal Thoughts:  Yes.  without intent/plan  Homicidal Thoughts:  No  Memory:  Immediate;   Fair Recent;   Fair Remote;   Fair  Judgement:  Fair  Insight:  Fair  Psychomotor Activity:  Restlessness  Concentration:  Concentration: Fair and Attention Span: Fair  Recall:  [employer] of Knowledge:  Fair  Language:  Fair  Akathisia:  No  Handed:  Right  AIMS (if indicated):   Not applicable  Assets:  Communication Skills Desire for Improvement Housing Resilience Social Support  ADL's:  Intact  Cognition:  WNL  Sleep:   Poor   Labs reviewed: C. difficile negative, GI  panel negative UDS positive for cannabinoids CMP hyponatremia-continue to monitor; blood glucose elevated, consider medication management- hemoglobin A1c will be ordered; elevated proteins slightly elevated creatinine CBC with elevated H/H, platelets, RBC Ethanol, salicylate, acetaminophen levels negative Phenytoin level pending   Treatment Plan Summary: Daily contact with patient to assess and evaluate symptoms and progress in treatment and Plan Admit to inpatient psychiatry  Mariel Craft, MD 09/01/2018, 10:35 AM

## 2018-09-01 NOTE — ED Notes (Signed)
Pt. C/o nausea. 
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
## 2018-09-01 NOTE — ED Provider Notes (Signed)
-----------------------------------------   12:27 AM on 09/01/2018 -----------------------------------------   Blood pressure (!) 139/108, pulse 91, temperature 98.1 F (36.7 C), temperature source Oral, resp. rate 16, height 1.6 m (5\' 3" ), weight 63.5 kg, last menstrual period 12/17/2007, SpO2 98 %.  The patient had no acute events since last update.  Calm and cooperative at this time.  I reviewed the telepsych report and they recommend inpatient psychiatric treatment.  The patient is voluntary and wants to stay.  As per their recommendations, I restarted her Cymbalta 30 mg by mouth every evening including at his current dose.  I verified in the medical record that she was apparently taking gabapentin 600 mg 4 times daily and I restarted that as well.  I also restarted her Keppra her prior dose of 500 mg p.o. twice daily, but I did not restart the Dilantin because there was another note in the medication reconciliation that she had been discontinued off of the Dilantin.  I ordered a Dilantin level to verify.  A consult to TTS has been placed but this will apparently have to wait until tomorrow because we do not have any TTS professionals available tonight.     Nicole Rose, MD 09/01/18 Jacinta Shoe

## 2018-09-01 NOTE — BH Assessment (Signed)
Patient is to be admitted to [facility] by Dr. Viviano Simas.  Attending Physician will be Dr. Jennet Maduro.   Patient has been assigned to room 309, by [facility] Charge Nurse Veronique.   Intake Paper Work has been signed and placed on patient chart.  ER staff is aware of the admission:  Glenda, ER Secretary    Dr. Mayford Knife, ER MD   Amy T., Patient's Nurse   Ethelene Browns, Patient Access.

## 2018-09-01 NOTE — BHH Suicide Risk Assessment (Signed)
[facility] Ad[facility] Suicide Risk [facility]   Nursing information obtained from:  Patient Demographic factors:  Caucasian, Low socioeconomic status Current Mental Status:  Suicidal ideation indicated by patient Loss Factors:  Loss of significant relationship Historical Factors:  NA Risk Reduction Factors:  Living with another person, especially a relative  Total Time spent with patient: 1 hour Principal Problem: Major depressive disorder, single episode, severe without psychosis (HCC) Diagnosis:  Principal Problem:   Major depressive disorder, single episode, severe without psychosis (HCC) Active Problems:   Tobacco use disorder   Cannabis use disorder, moderate, dependence (HCC)   Seizure (HCC)   Suicidal thoughts  Subjective Data: suicidal ideation  Continued Clinical Symptoms:  Alcohol Use Disorder Identification Test Final Score (AUDIT): 0 The "Alcohol Use Disorders Identification Test", Guidelines for Use in Primary Care, Second Edition.  World Science writerHealth Organization [facility](WHO). Score between 0-7:  no or low risk or alcohol related problems. Score between 8-15:  moderate risk of alcohol related problems. Score between 16-19:  high risk of alcohol related problems. Score 20 or above:  warrants further diagnostic evaluation for alcohol dependence and treatment.   CLINICAL FACTORS:   Depression:   Impulsivity Insomnia   Musculoskeletal: Strength & Muscle Tone: within normal limits Gait & Station: normal Patient leans: N/A  Psychiatric Specialty Exam: Physical Exam  Nursing note and vitals reviewed. Psychiatric: Her speech is normal and behavior is normal. Her mood appears anxious. Her affect is labile. Cognition and memory are normal. She expresses impulsivity. She exhibits a depressed mood. She expresses suicidal ideation. She expresses suicidal plans.    Review of Systems  Neurological: Negative.   Psychiatric/Behavioral: Positive for depression and suicidal ideas.  All other  systems reviewed and are negative.   Blood pressure 112/79, pulse (!) 59, temperature 97.6 F (36.4 C), temperature source Oral, resp. rate 18, height 5\' 3"  (1.6 m), weight 59.4 kg, last menstrual period 12/17/2007, SpO2 99 %.Body mass index is 23.21 kg/m.  General Appearance: Casual  Eye Contact:  Good  Speech:  Clear and Coherent  Volume:  Normal  Mood:  Anxious and Depressed  Affect:  Tearful  Thought Process:  Goal Directed and Descriptions of Associations: Intact  Orientation:  Full (Time, Place, and Person)  Thought Content:  WDL  Suicidal Thoughts:  Yes.  with intent/plan  Homicidal Thoughts:  No  Memory:  Immediate;   Poor Recent;   Poor Remote;   Poor  Judgement:  Fair  Insight:  Shallow  Psychomotor Activity:  Normal  Concentration:  Concentration: Fair and Attention Span: Fair  Recall:  FiservFair  Fund of Knowledge:  Fair  Language:  Fair  Akathisia:  No  Handed:  Right  AIMS (if indicated):     Assets:  Communication Skills Desire for Improvement Financial Resources/Insurance Housing Resilience Social Support  ADL's:  Intact  Cognition:  WNL  Sleep:  Number of Hours: 5.75      COGNITIVE FEATURES THAT CONTRIBUTE TO RISK:  None    SUICIDE RISK:   Moderate:  Frequent suicidal ideation with limited intensity, and duration, some specificity in terms of plans, no associated intent, good self-control, limited dysphoria/symptomatology, some risk factors present, and identifiable protective factors, including available and accessible social support.  PLAN OF CARE: hospital admission, medication management, substance abuse counseling, discharge planning.  [name] is a 58 year old female with remote history of substance abuse admitted for suicidal ideation of 4 days duration in the context of major loss.  #Suicidal ideation -patient is able  to contract for safety in the hospital  #Mood -start Seroquel 100 mg nightly -start remeron 15 nig nightly  #Opioid  abuse -on 12/25 run out of Dilaudid presccribeb by PCP at [facility] and went into withdrawals, this should be over -symptomatic treatment if necessary -no opioids will be offered  #Seizure disorder -patient has Keppra and Dilantin listed -claiims to take Dilantin  #Substance abuse -claims 7 years sobriety but positive for cannabis -minimizes problems, declines treatment  #Labs -lipid panel, TSH, A1C -EKG -pregnancy test  #Smoking  -nicotine patch is available  #Disposition -discharge to home Follow up with [facility]   I certify that inpatient services furnished can reasonably be expected to improve the patient's condition.   Kristine Linea, MD 09/02/2018, 12:25 PM

## 2018-09-01 NOTE — ED Notes (Signed)

## 2018-09-01 NOTE — Progress Notes (Signed)
Admission Note:  D: Pt appeared depressed  With  a flat affect.  Pt  Voice of suicidal ideations  With no plan . Patient also  present with grief issues with her son dieing over a heroin overdose  This past summer.  Patient unable to get past this concern. Patient also presents with medical concerns lump  On left side of chest, 2 collar bones chronic lower back pain.  Pt is redirectable and cooperative with assessment.      A: Pt admitted to unit per protocol, skin assessment and search done and no contraband found. With Shatara RN  Pt  educated on therapeutic milieu rules. Pt was introduced to milieu by nursing staff.    R: Pt was receptive to education about the milieu .  15 min safety checks started. Clinical research associate offered support

## 2018-09-01 NOTE — ED Notes (Signed)
ED TO INPATIENT HANDOFF REPORT  Name/Age/Gender Nicole Chandler 58 y.o. female  Code Status Code Status History    Date Active Date Inactive Code Status Order ID Comments User Context   03/24/2016 2018 03/25/2016 1735 Full Code [id number]  Auburn Bilberry, MD ED      Home/SNF/Other Home  Chief Complaint SI  Level of Care/Admitting Diagnosis ED Disposition    None      Medical History Past Medical History:  Diagnosis Date  . Arthritis   . Back pain   . Bipolar 1 disorder (HCC)   . Cancer (HCC)   . Chronic back pain unk  . Depression   . Panic attacks   . Sciatica   . Seizures (HCC)     Allergies Allergies  Allergen Reactions  . Cyclobenzaprine     "makes the pt jumpy"  . Naproxen Sodium     Intestinal bloating that was so bad she had to go to the ER    IV Location/Drains/Wounds Patient Lines/Drains/Airways Status   Active Line/Drains/Airways    None          Labs/Imaging Results for orders placed or performed during the hospital encounter of 08/31/18 (from the past 48 hour(s))  Comprehensive metabolic panel     Status: Abnormal   Collection Time: 08/31/18  3:48 PM  Result Value Ref Range   Sodium 134 (L) 135 - 145 mmol/L   Potassium 4.3 3.5 - 5.1 mmol/L    Comment: HEMOLYSIS AT THIS LEVEL MAY AFFECT RESULT   Chloride 102 98 - 111 mmol/L   CO2 23 22 - 32 mmol/L   Glucose, Bld 117 (H) 70 - 99 mg/dL   BUN <5 (L) 6 - 20 mg/dL   Creatinine, Ser 3.66 (H) 0.44 - 1.00 mg/dL   Calcium 29.4 8.9 - 76.5 mg/dL   Total Protein 9.0 (H) 6.5 - 8.1 g/dL   Albumin 5.1 (H) 3.5 - 5.0 g/dL   AST 24 15 - 41 U/L   ALT <5 0 - 44 U/L   Alkaline Phosphatase 118 38 - 126 U/L   Total Bilirubin 0.7 0.3 - 1.2 mg/dL   GFR calc non Af Amer 54 (L) >60 mL/min   GFR calc Af Amer >60 >60 mL/min   Anion gap 9 5 - 15    Comment: Performed at [facility], [address] [place], [place] [address]  Ethanol     Status: None   Collection Time: 08/31/18  3:48 PM   Result Value Ref Range   Alcohol, Ethyl (B) <10 <10 mg/dL    Comment: (NOTE) Lowest detectable limit for serum alcohol is 10 mg/dL. For medical purposes only. Performed at [facility], [address] [place], [place] [address]   Salicylate level     Status: None   Collection Time: 08/31/18  3:48 PM  Result Value Ref Range   Salicylate Lvl <7.0 2.8 - 30.0 mg/dL    Comment: Performed at [facility], [address] [place], [place] [address]  Acetaminophen level     Status: Abnormal   Collection Time: 08/31/18  3:48 PM  Result Value Ref Range   Acetaminophen (Tylenol), Serum <10 (L) 10 - 30 ug/mL    Comment: (NOTE) Therapeutic concentrations vary significantly. A range of 10-30 ug/mL  may be an effective concentration for many patients. However, some  are best treated at concentrations outside of this range. Acetaminophen concentrations >150 ug/mL at 4 hours after ingestion  and >50 ug/mL  at 12 hours after ingestion are often associated with  toxic reactions. Performed at [facility], [address] [place], [place] [address]   cbc     Status: Abnormal   Collection Time: 08/31/18  3:48 PM  Result Value Ref Range   WBC 9.2 4.0 - 10.5 K/uL   RBC 5.28 (H) 3.87 - 5.11 MIL/uL   Hemoglobin 16.2 (H) 12.0 - 15.0 g/dL   HCT 19.150.8 (H) 47.836.0 - 29.546.0 %   MCV 96.2 80.0 - 100.0 fL   MCH 30.7 26.0 - 34.0 pg   MCHC 31.9 30.0 - 36.0 g/dL   RDW 62.112.6 30.811.5 - 65.715.5 %   Platelets 425 (H) 150 - 400 K/uL   nRBC 0.0 0.0 - 0.2 %    Comment: Performed at [facility], [address] [place], [place] [address]  Urine Drug Screen, Qualitative     Status: Abnormal   Collection Time: 08/31/18  3:49 PM  Result Value Ref Range   Tricyclic, Ur Screen NONE DETECTED NONE DETECTED   Amphetamines, Ur Screen NONE DETECTED NONE DETECTED   MDMA (Ecstasy)Ur Screen NONE DETECTED NONE DETECTED   Cocaine Metabolite,Ur [place] NONE DETECTED NONE DETECTED   Opiate, Ur Screen  NONE DETECTED NONE DETECTED   Phencyclidine (PCP) Ur S NONE DETECTED NONE DETECTED   Cannabinoid 50 Ng, Ur [place] POSITIVE (A) NONE DETECTED   Barbiturates, Ur Screen NONE DETECTED NONE DETECTED   Benzodiazepine, Ur Scrn NONE DETECTED NONE DETECTED   Methadone Scn, Ur NONE DETECTED NONE DETECTED    Comment: (NOTE) Tricyclics + metabolites, urine    Cutoff 1000 ng/mL Amphetamines + metabolites, urine  Cutoff 1000 ng/mL MDMA (Ecstasy), urine              Cutoff 500 ng/mL Cocaine Metabolite, urine          Cutoff 300 ng/mL Opiate + metabolites, urine        Cutoff 300 ng/mL Phencyclidine (PCP), urine         Cutoff 25 ng/mL Cannabinoid, urine                 Cutoff 50 ng/mL Barbiturates + metabolites, urine  Cutoff 200 ng/mL Benzodiazepine, urine              Cutoff 200 ng/mL Methadone, urine                   Cutoff 300 ng/mL The urine drug screen provides only a preliminary, unconfirmed analytical test result and should not be used for non-medical purposes. Clinical consideration and professional judgment should be applied to any positive drug screen result due to possible interfering substances. A more specific alternate chemical method must be used in order to obtain a confirmed analytical result. Gas chromatography / mass spectrometry (GC/MS) is the preferred confirmat ory method. Performed at [facility], [address] [place], [place] [address]   C difficile quick scan w PCR reflex     Status: None   Collection Time: 08/31/18  4:37 PM  Result Value Ref Range   C Diff antigen NEGATIVE NEGATIVE   C Diff toxin NEGATIVE NEGATIVE   C Diff interpretation No C. difficile detected.     Comment: Performed at [facility], [address] [place], [place] [address]  Gastrointestinal Panel by PCR , Stool     Status: None   Collection Time: 08/31/18  4:37 PM  Result Value Ref Range   Campylobacter species NOT DETECTED NOT  DETECTED   Plesimonas shigelloides NOT  DETECTED NOT DETECTED   Salmonella species NOT DETECTED NOT DETECTED   Yersinia enterocolitica NOT DETECTED NOT DETECTED   Vibrio species NOT DETECTED NOT DETECTED   Vibrio cholerae NOT DETECTED NOT DETECTED   Enteroaggregative E coli (EAEC) NOT DETECTED NOT DETECTED   Enteropathogenic E coli (EPEC) NOT DETECTED NOT DETECTED   Enterotoxigenic E coli (ETEC) NOT DETECTED NOT DETECTED   Shiga like toxin producing E coli (STEC) NOT DETECTED NOT DETECTED   Shigella/Enteroinvasive E coli (EIEC) NOT DETECTED NOT DETECTED   Cryptosporidium NOT DETECTED NOT DETECTED   Cyclospora cayetanensis NOT DETECTED NOT DETECTED   Entamoeba histolytica NOT DETECTED NOT DETECTED   Giardia lamblia NOT DETECTED NOT DETECTED   Adenovirus F40/41 NOT DETECTED NOT DETECTED   Astrovirus NOT DETECTED NOT DETECTED   Norovirus GI/GII NOT DETECTED NOT DETECTED   Rotavirus A NOT DETECTED NOT DETECTED   Sapovirus (I, II, IV, and V) NOT DETECTED NOT DETECTED    Comment: Performed at [facility], [address] [place], [place] [address]   Dg Chest 2 View  Result Date: 08/31/2018 CLINICAL DATA:  Soft tissue mass at LEFT posterior axillary line EXAM: CHEST - 2 VIEW COMPARISON:  None FINDINGS: Normal heart size, mediastinal contours, and pulmonary vascularity. Emphysematous and bronchitic changes consistent with COPD. No infiltrate, pleural effusion or pneumothorax. Old healed fracture of the posterior RIGHT sixth rib. Bones appear demineralized. LEFT cervical rib with pseudoarthrosis. No acute osseous findings. IMPRESSION: COPD changes. At no acute abnormalities. Electronically Signed   By: Ulyses SouthwardMark  Boles M.D.   On: 08/31/2018 17:10    Pending Labs Unresulted Labs (From admission, onward)    Start     Ordered   09/01/18 0023  Phenytoin level, free and total  Add-on,   AD     09/01/18 0024   Signed and Held  Levetiracetam level  Add-on,   R     Signed and Held   Signed and Held  Hemoglobin A1c  Add-on,   R      Signed and Held   Signed and Held  Lipid panel  Add-on,   R     Signed and Held   Signed and Held  TSH  Add-on,   R     Signed and Held   Signed and Held  Pregnancy, urine  Add-on,   R     Signed and Held          Vitals/Pain Today's Vitals   09/01/18 0245 09/01/18 1041 09/01/18 1044 09/01/18 1125  BP:  117/84    Pulse:  77    Resp:  17    Temp:  98.1 F (36.7 C)    TempSrc:  Oral    SpO2:  98%    Weight:      Height:      PainSc: 7   0-No pain 0-No pain    Isolation Precautions Enteric precautions (UV disinfection)  Medications Medications  nicotine (NICODERM CQ - dosed in mg/24 hours) patch 21 mg (21 mg Transdermal Patch Applied 08/31/18 2119)  DULoxetine (CYMBALTA) DR capsule 30 mg (30 mg Oral Given 09/01/18 0116)  gabapentin (NEURONTIN) tablet 600 mg (600 mg Oral Given 09/01/18 1041)  levETIRAcetam (KEPPRA) tablet 500 mg (500 mg Oral Given 09/01/18 1041)  acetaminophen (TYLENOL) tablet 1,000 mg (1,000 mg Oral Given 09/01/18 0414)  ondansetron (ZOFRAN-ODT) disintegrating tablet 4 mg (4 mg Oral Given 09/01/18 0414)    Mobility walks

## 2018-09-02 DIAGNOSIS — F322 Major depressive disorder, single episode, severe without psychotic features: Principal | ICD-10-CM

## 2018-09-02 LAB — PHENYTOIN LEVEL, FREE AND TOTAL
PHENYTOIN, TOTAL: 4.6 ug/mL — AB (ref 10.0–20.0)
Phenytoin, Free: NOT DETECTED ug/mL (ref 1.0–2.0)

## 2018-09-02 MED ORDER — CLONAZEPAM 0.5 MG PO TABS
0.5000 mg | ORAL_TABLET | Freq: Once | ORAL | Status: AC
  Administered 2018-09-02: 0.5 mg via ORAL
  Filled 2018-09-02: qty 1

## 2018-09-02 MED ORDER — NICOTINE 21 MG/24HR TD PT24
21.0000 mg | MEDICATED_PATCH | Freq: Every day | TRANSDERMAL | Status: DC
  Administered 2018-09-02 – 2018-09-03 (×2): 21 mg via TRANSDERMAL
  Filled 2018-09-02 (×2): qty 1

## 2018-09-02 MED ORDER — QUETIAPINE FUMARATE 200 MG PO TABS
200.0000 mg | ORAL_TABLET | Freq: Every day | ORAL | Status: DC
  Administered 2018-09-02: 200 mg via ORAL
  Filled 2018-09-02: qty 1

## 2018-09-02 MED ORDER — GABAPENTIN 300 MG PO CAPS
300.0000 mg | ORAL_CAPSULE | Freq: Four times a day (QID) | ORAL | Status: DC
  Administered 2018-09-02 – 2018-09-03 (×4): 300 mg via ORAL
  Filled 2018-09-02 (×4): qty 1

## 2018-09-02 NOTE — Progress Notes (Signed)
Recreation Therapy Notes  INPATIENT RECREATION THERAPY ASSESSMENT  Patient Details Name: Nicole Chandler MRN: [id number] DOB: 24-Jun-1961 Today's Date: 09/02/2018       Information Obtained From: Patient  Able to Participate in Assessment/Interview: Yes  Patient Presentation: Responsive  Reason for Admission (Per Patient): Active Symptoms, Other (Comments)(depression)  Patient Stressors: Death  Coping Skills:   Film/video editor, Talk  Leisure Interests (2+):  Social - Family, Individual - Phone(Play with dog)  Frequency of Recreation/Participation: Weekly  Awareness of Community Resources:  Yes  Community Resources:  Library  Current Use: Yes  If no, Barriers?:    Expressed Interest in [employer] Information:    [place] of Residence:  Film/video editor  Patient Main Form of Transportation: Other (Comment)(My friend)  Patient Strengths:  I am social, people person  Patient Identified Areas of Improvement:  quit wishing for things that are gone  Patient Goal for Hospitalization:  Not be so depressed and learn coping skills  Current SI (including self-harm):  No  Current HI:  No  Current AVH: No  Staff Intervention Plan: Group Attendance, Collaborate with Interdisciplinary Treatment Team  Consent to Intern Participation: N/A  Nicole Chandler 09/02/2018, 10:38 AM
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
## 2018-09-02 NOTE — [facility] Group Notes (Signed)
Balance In Life 09/02/2018 1PM  Type of Therapy/Topic:  Group Therapy:  Balance in Life  Participation Level:  Active  Description of Group:   This group will address the concept of balance and how it feels and looks when one is unbalanced. Patients will be encouraged to process areas in their lives that are out of balance and identify reasons for remaining unbalanced. Facilitators will guide patients in utilizing problem-solving interventions to address and correct the stressor making their life unbalanced. Understanding and applying boundaries will be explored and addressed for obtaining and maintaining a balanced life. Patients will be encouraged to explore ways to assertively make their unbalanced needs known to significant others in their lives, using other group members and facilitator for support and feedback.  Therapeutic Goals: 1. Patient will identify two or more emotions or situations they have that consume much of in their lives. 2. Patient will identify signs/triggers that life has become out of balance:  3. Patient will identify two ways to set boundaries in order to achieve balance in their lives:  4. Patient will demonstrate ability to communicate their needs through discussion and/or role plays  Summary of Patient Progress: Actively and appropriately engaged in the group. Patient was able to provide support and validation to other group members.Patient practiced active listening when interacting with the facilitator and other group members. Patient demonstrated good insight and discussed how she uses her faith to find balance in life.    Therapeutic Modalities:   Cognitive Behavioral Therapy Solution-Focused Therapy Assertiveness Training  Abdoulie Tierce Philip Aspen, LCSW

## 2018-09-02 NOTE — Plan of Care (Signed)
Patient verbalizes understanding of the general information that's been provided to her and all questions/concerns have been addressed and answered at this time. Patient rated her anxiety a "10/10" stating that "it's right here in my chest". Patient has attended and participated in unit activities without any issues. Patient denies SI/HI/AVH, however, she rated her depression an "8/10" stating that "I'm still depressed, but I'm doing good now that I'm around people". Patient remains safe on the unit at this time.  Problem: Education: Goal: Knowledge of [facility] Education information/materials will improve Outcome: Progressing   Problem: Coping: Goal: Level of anxiety will decrease Outcome: Progressing   Problem: Coping: Goal: Coping ability will improve Outcome: Progressing   Problem: Safety: Goal: Ability to disclose and discuss suicidal ideas will improve Outcome: Progressing   Problem: Self-Concept: Goal: Ability to disclose and discuss suicidal ideas will improve Outcome: Progressing

## 2018-09-02 NOTE — Plan of Care (Signed)
  Problem: Coping: Goal: Coping ability will improve Outcome: Progressing  Patient is interacting appropriately appears less anxious.  

## 2018-09-02 NOTE — Progress Notes (Signed)
D- Patient alert and oriented. Patient presents in a pleasant mood on assessment stating that she "tossed and turned" last night. 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 Patient did request pain medication from this writer. Patient rated her depression an "70/10" stating that "I'm still depressed, but I'm doing good now that I'm around people". Patient rated her anxiety a "10/10" stating that "it's right here in my chest". Patient denies SI, HI, AVH, at this time. Patient's goal for today is to "try to walk around better".  A- Scheduled medications administered to patient, per MD orders. Support and encouragement provided.  Routine safety checks conducted every 15 minutes.  Patient informed to notify staff with problems or concerns.  R- No adverse drug reactions noted. Patient contracts for safety at this time. Patient compliant with medications and treatment plan. Patient receptive, calm, and cooperative. Patient interacts well with others on the unit.  Patient remains safe at this time.

## 2018-09-02 NOTE — Progress Notes (Signed)
Per approval from MD, changed gabapentin from 600mg  qid to 300mg  qid due to CrCl ~45 (CrCl >30 to 59 mL/minute: Oral: 200 to 700 mg twice daily)

## 2018-09-02 NOTE — Progress Notes (Signed)
Patient alert and oriented x 4, denies SI/HI/AVH affect is flat but brightens upon approach. thoughts are organized and coherent, no bizarre  Behavior noted, interacting appropriately with peers and staff, denies any distress.  Support and encouragement offered to patient, 15 minutes safety checks maintained will continue to monitor.

## 2018-09-02 NOTE — Progress Notes (Signed)
Recreation Therapy Notes  Date: 09/02/2018  Time: 9:30 am  Location: Craft Room  Behavioral response: Appropriate  Intervention Topic: Relaxation  Discussion/Intervention:  Group content today was focused on relaxation. The group defined relaxation and identified healthy ways to relax. Individuals expressed how much time they spend relaxing. Patients expressed how much their life would be if they did not make time for themselves to relax. The group stated ways they could improve their relaxation techniques in the future.  Individuals participated in the intervention "Time to Relax" where they had a chance to experience different relaxation techniques.  Clinical Observations/Feedback:  Patient came to group late and stated she watches television when she wants to relax. She explained that if no one ever had time to relax everyone would be sick.  Individual was social with peers and staff while participating in the intervention. Nicole Chandler LRT/CTRS         Nicole Chandler 09/02/2018 10:53 AM

## 2018-09-03 MED ORDER — GABAPENTIN 300 MG PO CAPS: 300.0000 mg | ORAL_CAPSULE | Freq: Four times a day (QID) | ORAL | 1 refills | Status: DC

## 2018-09-03 MED ORDER — GABAPENTIN 600 MG PO TABS: 600.0000 mg | ORAL_TABLET | Freq: Four times a day (QID) | ORAL | 1 refills | Status: DC

## 2018-09-03 MED ORDER — QUETIAPINE FUMARATE 200 MG PO TABS: 300.0000 mg | ORAL_TABLET | Freq: Every day | ORAL | Status: DC

## 2018-09-03 MED ORDER — MIRTAZAPINE 15 MG PO TABS: 15.0000 mg | ORAL_TABLET | Freq: Every day | ORAL | 1 refills | Status: DC

## 2018-09-03 MED ORDER — PHENYTOIN SODIUM EXTENDED 100 MG PO CAPS: 100.0000 mg | ORAL_CAPSULE | Freq: Three times a day (TID) | ORAL | 2 refills | Status: AC

## 2018-09-03 NOTE — [facility] Suicide Risk Assessment (Signed)
[facility] INPATIENT:  Family/Significant Other Suicide Prevention Education  Suicide Prevention Education:  Education Completed; Gilford Raid, sister, [phone], has been identified by the patient as the family member/significant other with whom the patient will be residing, and identified as the person(s) who will aid the patient in the event of a mental health crisis (suicidal ideations/suicide attempt).  With written consent from the patient, the family member/significant other has been provided the following suicide prevention education, prior to the and/or following the discharge of the patient.  The suicide prevention education provided includes the following:  Suicide risk factors  Suicide prevention and interventions  National Suicide Hotline telephone number  [facility] assessment telephone number  [facility] Emergency Assistance 911  [facility] and/or Residential Mobile Crisis Unit telephone number  Request made of family/significant other to:  Remove weapons (e.g., guns, rifles, knives), all items previously/currently identified as safety concern.  Pt does not have access to guns that sister is aware of.   Remove drugs/medications (over-the-counter, prescriptions, illicit drugs), all items previously/currently identified as a safety concern.  The family member/significant other verbalizes understanding of the suicide prevention education information provided.  The family member/significant other agrees to remove the items of safety concern listed above.  Lorri Frederick, LCSW 09/03/2018, 2:57 PM

## 2018-09-03 NOTE — Progress Notes (Signed)
Patient alert and oriented x 4. Ambulates unit with steady gait. Verbally denies SI/HI/AVH and pain. Patient discharged on above date and time. Verbalized understanding the discharge information provided to patient upon discharge. Patient departed unit with discharge paperwork, prescriptions and personal belongings. Picked up by her boyfriend and his son to go home and plans to follow up with [facility] on 09/06/18.

## 2018-09-03 NOTE — Progress Notes (Signed)
Patient signed a 72 hour request to leave on 09/01/18. Patient presented to the nurses station asking to be discharged. Patient will be assessed by the MD for appropriateness for discharge. Compliant with food/fluid intake and medications. Milieu remains safe with q 15 minute safety checks. Will continue to monitor.

## 2018-09-03 NOTE — Progress Notes (Signed)
Recreation Therapy Notes   Date: 09/03/2018  Time: 9:30 am  Location: Craft Room  Behavioral response: Appropriate  Intervention Topic: Leisure  Discussion/Intervention:  Group content today was focused on leisure. The group defined what leisure is and some positive leisure activities they participate in. Individuals identified the difference between good and bad leisure. Participants expressed how they feel after participating in the leisure of their choice. The group discussed how they go about picking a leisure activity and if others are involved in their leisure activities. The patient stated how many leisure activities they too choose from and reasons why it is important to have leisure time. Individuals participated in the intervention "Leisure Jeopardy" where they had a chance to identify new leisure activities as well as benefits of leisure. Clinical Observations/Feedback:  Patient came to group and identified playing with her dog, watching television and taking a bath as a leisure activity she enjoys. Individual was social with peers and staff while participating in the intervention. Lian Tanori LRT/CTRS          Hussien Greenblatt 09/03/2018 11:20 AM

## 2018-09-03 NOTE — [facility] Suicide Risk Assessment (Signed)
[facility] Discharge Suicide Risk Assessment   Principal Problem: Major depressive disorder, single episode, severe without psychosis (HCC) Discharge Diagnoses: Principal Problem:   Major depressive disorder, single episode, severe without psychosis (HCC) Active Problems:   Tobacco use disorder   Cannabis use disorder, moderate, dependence (HCC)   Seizure (HCC)   Suicidal thoughts   Total Time spent with patient: 20 minutes  Musculoskeletal: Strength & Muscle Tone: within normal limits Gait & Station: normal Patient leans: Backward  Psychiatric Specialty Exam: Review of Systems  Neurological: Negative.   Psychiatric/Behavioral: Negative.   All other systems reviewed and are negative.   Blood pressure (!) 139/94, pulse 66, temperature 97.6 F (36.4 C), temperature source Oral, resp. rate 20, height 5\' 3"  (1.6 m), weight 59.4 kg, last menstrual period 12/17/2007, SpO2 100 %.Body mass index is 23.21 kg/m.  General Appearance: Casual  Eye Contact::  Good  Speech:  Clear and Coherent409  Volume:  Normal  Mood:  Euthymic  Affect:  Appropriate  Thought Process:  Goal Directed and Descriptions of Associations: Intact  Orientation:  Full (Time, Place, and Person)  Thought Content:  WDL  Suicidal Thoughts:  No  Homicidal Thoughts:  No  Memory:  Immediate;   Fair Recent;   Fair Remote;   Fair  Judgement:  Poor  Insight:  Shallow  Psychomotor Activity:  Normal  Concentration:  Fair  Recall:  [employer] of Knowledge:Fair  Language: Fair  Akathisia:  No  Handed:  Left  AIMS (if indicated):     Assets:  Communication Skills Desire for Improvement Financial Resources/Insurance Housing Resilience Social Support  Sleep:  Number of Hours: 5.5  Cognition: WNL  ADL's:  Intact   Mental Status Per Nursing Assessment::   On Admission:  Suicidal ideation indicated by patient  Demographic Factors:  Divorced or widowed, Caucasian and Living alone  Loss Factors: Loss of significant  relationship  Historical Factors: Impulsivity  Risk Reduction Factors:   Sense of responsibility to family and Positive social support  Continued Clinical Symptoms:  Depression:   Impulsivity  Cognitive Features That Contribute To Risk:  None    Suicide Risk:  Minimal: No identifiable suicidal ideation.  Patients presenting with no risk factors but with morbid ruminations; may be classified as minimal risk based on the severity of the depressive symptoms    Plan Of Care/Follow-up recommendations:  Activity:  as tolerated Diet:  low sodium heart healthy Other:  keep follow up appointments  Kristine Linea, MD 09/03/2018, 2:19 PM

## 2018-09-03 NOTE — Progress Notes (Signed)
  [facility] Adult Case Management Discharge Plan :  Will you be returning to the same living situation after discharge:  Yes,  with friend At discharge, do you have transportation home?: Yes,  friend Do you have the ability to pay for your medications: No. Medication management application faxed.  Release of information consent forms completed and in the chart;  Patient's signature needed at discharge.  Patient to Follow up at: Follow-up Information    [facility] Go on 09/10/2018.   Why:  Please meet Unk [facility], Peer Support services on Friday, 09/10/18, at 730am. Please bring photo ID and your hospital discharge paperwork.  Contact information: [address] [place] [address] [phone]        [facility] Go on 09/13/2018.   Specialty:  Hospice and Palliative Medicine Why:  Please attend your therapy intake appt on Monday, 09/13/18, at 330pm.   Contact information: [address] [place] [address] [phone]           Next level of care provider has access to [facility] Link:no  Safety Planning and Suicide Prevention discussed: Yes,  with sister  Have you used any form of tobacco in the last 30 days? (Cigarettes, Smokeless Tobacco, Cigars, and/or Pipes): Yes  Has patient been referred to the Quitline?: Patient refused referral  Patient has been referred for addiction treatment: Yes  Lorri Frederick, LCSW 09/03/2018, 3:10 PM

## 2018-09-03 NOTE — Tx Team (Addendum)
Interdisciplinary Treatment and Diagnostic Plan Update  09/03/2018 Time of Session: [address] Nicole Chandler MRN: [id number]  Principal Diagnosis: Major depressive disorder, single episode, severe without psychosis (HCC)  Secondary Diagnoses: Principal Problem:   Major depressive disorder, single episode, severe without psychosis (HCC) Active Problems:   Tobacco use disorder   Cannabis use disorder, moderate, dependence (HCC)   Seizure (HCC)   Suicidal thoughts   Current Medications:  Current Facility-Administered Medications  Medication Dose Route Frequency Provider Last Rate Last Dose  . acetaminophen (TYLENOL) tablet 650 mg  650 mg Oral Q6H PRN Mariel Craft, MD   650 mg at 09/02/18 0931  . alum & mag hydroxide-simeth (MAALOX/MYLANTA) 200-200-20 MG/5ML suspension 30 mL  30 mL Oral Q4H PRN Mariel Craft, MD      . gabapentin (NEURONTIN) capsule 300 mg  300 mg Oral QID Pucilowska, Jolanta B, MD   300 mg at 09/03/18 1154  . ibuprofen (ADVIL,MOTRIN) tablet 600 mg  600 mg Oral Q6H PRN Pucilowska, Jolanta B, MD   600 mg at 09/02/18 0809  . magnesium hydroxide (MILK OF MAGNESIA) suspension 30 mL  30 mL Oral Daily PRN Mariel Craft, MD      . mirtazapine (REMERON) tablet 15 mg  15 mg Oral QHS Pucilowska, Jolanta B, MD   15 mg at 09/02/18 2116  . nicotine (NICODERM CQ - dosed in mg/24 hours) patch 21 mg  21 mg Transdermal Daily Pucilowska, Jolanta B, MD   21 mg at 09/03/18 0757  . phenytoin (DILANTIN) ER capsule 300 mg  300 mg Oral Daily Pucilowska, Jolanta B, MD   300 mg at 09/03/18 0756  . QUEtiapine (SEROQUEL) tablet 300 mg  300 mg Oral QHS Pucilowska, Jolanta B, MD       PTA Medications: Medications Prior to Admission  Medication Sig Dispense Refill Last Dose  . cyclobenzaprine (FLEXERIL) 5 MG tablet Take 1 tablet (5 mg total) by mouth 3 (three) times daily as needed for muscle spasms. (Patient not taking: Reported on 05/24/2018) 15 tablet 0 Not Taking at Unknown time  .  diphenhydrAMINE (BENADRYL) 25 MG tablet Take 25 mg by mouth every 6 (six) hours as needed for allergies.    unknown at unknown  . gabapentin (NEURONTIN) 600 MG tablet Take 600 mg by mouth 4 (four) times daily.   unknown at unknown  . ketorolac (TORADOL) 10 MG tablet Take 1 tablet (10 mg total) by mouth every 8 (eight) hours. (Patient not taking: Reported on 05/24/2018) 15 tablet 0 Not Taking at Unknown time  . levETIRAcetam (KEPPRA) 500 MG tablet Take 1 tablet (500 mg total) by mouth 2 (two) times daily. (Patient not taking: Reported on 05/24/2018) 60 tablet 0 Not Taking at Unknown time  . mirtazapine (REMERON) 15 MG tablet Take 1 tablet (15 mg total) at bedtime by mouth. (Patient not taking: Reported on 11/12/2017) 30 tablet 0 Not Taking at Unknown time  . phenytoin (DILANTIN) 100 MG ER capsule Take 1 capsule (100 mg total) by mouth 3 (three) times daily. (Patient not taking: Reported on 05/24/2018) 90 capsule 2 Not Taking at Unknown time    Patient Stressors:    Patient Strengths:    Treatment Modalities: Medication Management, Group therapy, Case management,  1 to 1 session with clinician, Psychoeducation, Recreational therapy.   Physician Treatment Plan for Primary Diagnosis: Major depressive disorder, single episode, severe without psychosis (HCC) Long Term Goal(s): Improvement in symptoms so as ready for discharge Improvement in symptoms so as ready  for discharge   Short Term Goals: Ability to identify changes in lifestyle to reduce recurrence of condition will improve Ability to verbalize feelings will improve Ability to disclose and discuss suicidal ideas Ability to demonstrate self-control will improve Ability to identify and develop effective coping behaviors will improve Ability to maintain clinical measurements within normal limits will improve Compliance with prescribed medications will improve Ability to identify triggers associated with substance abuse/mental health issues will  improve Ability to identify changes in lifestyle to reduce recurrence of condition will improve Ability to identify and develop effective coping behaviors will improve Ability to identify triggers associated with substance abuse/mental health issues will improve  Medication Management: Evaluate patient's response, side effects, and tolerance of medication regimen.  Therapeutic Interventions: 1 to 1 sessions, Unit Group sessions and Medication administration.  Evaluation of Outcomes: Progressing  Physician Treatment Plan for Secondary Diagnosis: Principal Problem:   Major depressive disorder, single episode, severe without psychosis (HCC) Active Problems:   Tobacco use disorder   Cannabis use disorder, moderate, dependence (HCC)   Seizure (HCC)   Suicidal thoughts  Long Term Goal(s): Improvement in symptoms so as ready for discharge Improvement in symptoms so as ready for discharge   Short Term Goals: Ability to identify changes in lifestyle to reduce recurrence of condition will improve Ability to verbalize feelings will improve Ability to disclose and discuss suicidal ideas Ability to demonstrate self-control will improve Ability to identify and develop effective coping behaviors will improve Ability to maintain clinical measurements within normal limits will improve Compliance with prescribed medications will improve Ability to identify triggers associated with substance abuse/mental health issues will improve Ability to identify changes in lifestyle to reduce recurrence of condition will improve Ability to identify and develop effective coping behaviors will improve Ability to identify triggers associated with substance abuse/mental health issues will improve     Medication Management: Evaluate patient's response, side effects, and tolerance of medication regimen.  Therapeutic Interventions: 1 to 1 sessions, Unit Group sessions and Medication administration.  Evaluation of  Outcomes: Progressing   RN Treatment Plan for Primary Diagnosis: Major depressive disorder, single episode, severe without psychosis (HCC) Long Term Goal(s): Knowledge of disease and therapeutic regimen to maintain health will improve  Short Term Goals: Ability to identify and develop effective coping behaviors will improve and Compliance with prescribed medications will improve  Medication Management: RN will administer medications as ordered by provider, will assess and evaluate patient's response and provide education to patient for prescribed medication. RN will report any adverse and/or side effects to prescribing provider.  Therapeutic Interventions: 1 on 1 counseling sessions, Psychoeducation, Medication administration, Evaluate responses to treatment, Monitor vital signs and CBGs as ordered, Perform/monitor CIWA, COWS, AIMS and Fall Risk screenings as ordered, Perform wound care treatments as ordered.  Evaluation of Outcomes: Progressing   LCSW Treatment Plan for Primary Diagnosis: Major depressive disorder, single episode, severe without psychosis (HCC) Long Term Goal(s): Safe transition to appropriate next level of care at discharge, Engage patient in therapeutic group addressing interpersonal concerns.  Short Term Goals: Engage patient in aftercare planning with referrals and resources, Increase social support and Increase skills for wellness and recovery  Therapeutic Interventions: Assess for all discharge needs, 1 to 1 time with Social worker, Explore available resources and support systems, Assess for adequacy in community support network, Educate family and significant other(s) on suicide prevention, Complete Psychosocial Assessment, Interpersonal group therapy.  Evaluation of Outcomes: Progressing   Progress in Treatment: Attending groups: Yes. Participating  in groups: Yes. Taking medication as prescribed: Yes. Toleration medication: Yes. Family/Significant other contact  made: No, will contact:  sister Patient understands diagnosis: Yes. Discussing patient identified problems/goals with staff: Yes. Medical problems stabilized or resolved: Yes. Denies suicidal/homicidal ideation: Yes. Issues/concerns per patient self-inventory: No. Other: none  New problem(s) identified: No, Describe:  none  New Short Term/Long Term Goal(s):  Patient Goals:  "try to not be so depressed"  Discharge Plan or Barriers:   Reason for Continuation of Hospitalization: Depression Medication stabilization  Estimated Length of Stay: 2-4 days  Recreational Therapy: Patient Stressors: Death Patient Goal: Patient will identify 3 positive coping skills strategies to use post d/c within 5 recreation therapy group sessions  Attendees: Patient:Nicole Chandler 09/03/2018   Physician: Dr. Jennet Maduro, MD 09/03/2018   Nursing: Bruce Donath, RN 09/03/2018   RN Care Manager: 09/03/2018   Social Worker: Daleen Squibb, LCSW 09/03/2018   Recreational Therapist: Garret Reddish, LRT-CTRS 09/03/2018   Other:  09/03/2018  Other:  09/03/2018   Other: 09/03/2018        Scribe for Treatment Team: Lorri Frederick, LCSW 09/03/2018 1:40 PM

## 2018-09-03 NOTE — Plan of Care (Signed)
  Problem: Coping: Goal: Coping ability will improve Outcome: Progressing  Patient is interacting appropriately with peers .

## 2018-09-03 NOTE — Discharge Summary (Addendum)
Physician Discharge Summary Note  Patient:  Nicole Chandler is an 58 y.o., female MRN:  [id number] DOB:  [birth year] Patient phone:  [phone] (home)  Patient address:   [address] [place] [address],  Total Time spent with patient: 20 minutes plus 15 min on care coordination and documentation  Date of Admission:  09/01/2018 Date of Discharge: 09/03/2018  Reason for Admission:  Suicidal ideation.  History of Present Illness:   Identifying data. [name] is a 58 year old female with remote history of substance abuse.  Chief complaint. "I lost my son to overdose in May 2019."  History of present illness. Information was obtained from the patient and the chart. The patient came to the ER complaining of worsening of depression and and suicidal thoughts of four days duration. He 36 year old son, with whom she was close, overdosed on heroine. Somehow, the patient was not allowed to participate in the funeral. She has been strugling with her loss alone. She did what she could around the holidays. She even adopted a stray cat in addition to her 4 dogs with little relief. She does see a primary MD at [facility] who prescribes Dilantin, Neurontin and Remeron. She gets her medication from mail-in pharmacy but her order was delayed and she has been out of her medication since 12/25. She reports extremely poor sleep, decreased appetite with weight loss, anhedonia, feeling of worthlessness hopelessness and guilt, poor energy any concentration, crying spells and no suicidal thinking. Reportys hearing vague voices/noises but no commands. Anxiety is high. She claims 7 years of sobriety from cocaine. Positive for cannabis.  Past psychiatric history. Denies any except for substance use but her record review reveals intentional overdose in 2017.  Family psychiatric history. Mother with depression, son died from opioid overdose.  Social history. Lives with cats and dogs in [facility]. Has children and  grandchildren. Uninsured.  Principal Problem: Major depressive disorder, single episode, severe without psychosis (HCC) Discharge Diagnoses: Principal Problem:   Major depressive disorder, single episode, severe without psychosis (HCC) Active Problems:   Tobacco use disorder   Cannabis use disorder, moderate, dependence (HCC)   Seizure (HCC)   Suicidal thoughts   Past Medical History:  Past Medical History:  Diagnosis Date  . Arthritis   . Back pain   . Bipolar 1 disorder (HCC)   . Cancer (HCC)   . Chronic back pain unk  . Depression   . Panic attacks   . Sciatica   . Seizures (HCC)     Past Surgical History:  Procedure Laterality Date  . ABDOMINAL SURGERY    . EYE SURGERY     Family History:  Family History  Problem Relation Age of Onset  . Alzheimer's disease Mother   . AAA (abdominal aortic aneurysm) Father   . Breast cancer Sister 34   Social History:  Social History   Substance and Sexual Activity  Alcohol Use No     Social History   Substance and Sexual Activity  Drug Use Yes  . Types: Cocaine, Marijuana    Social History   Socioeconomic History  . Marital status: Single    Spouse name: Not on file  . Number of children: Not on file  . Years of education: Not on file  . Highest education level: Not on file  Occupational History  . Not on file  Social Needs  . Financial resource strain: Not on file  . Food insecurity:    Worry: Not on file  Inability: Not on file  . Transportation needs:    Medical: Not on file    Non-medical: Not on file  Tobacco Use  . Smoking status: Current Every Day Smoker    Packs/day: 0.50    Types: Cigarettes    Last attempt to quit: 08/27/2014    Years since quitting: 4.0  . Smokeless tobacco: Never Used  Substance and Sexual Activity  . Alcohol use: No  . Drug use: Yes    Types: Cocaine, Marijuana  . Sexual activity: Not Currently  Lifestyle  . Physical activity:    Days per week: Not on file    Minutes  per session: Not on file  . Stress: Not on file  Relationships  . Social connections:    Talks on phone: Not on file    Gets together: Not on file    Attends religious service: Not on file    Active member of club or organization: Not on file    Attends meetings of clubs or organizations: Not on file    Relationship status: Not on file  Other Topics Concern  . Not on file  Social History Narrative   ** Merged History Encounter [facility]**        Hospital Course:    [name] is a 58 year old female with remote history of substance abuse admitted for suicidal ideation of 4 days duration in the context of major loss. She was restarted on medications and tolerated them well. At the time of discharge, the patient was no longer suicidal or homicidal. She was able to contract for safety. She was admitted to the hospital voluntarily and on 09/01/2018 she signed 72-hour release form. She left against medical advice.   #Mood -Remeron 15 nig nightly -we tried Seroquel but the patient felt restless  #Seizure disorder -continue Dilantin 100 mg TID   #Substance abuse -claims 7 years sobriety but positive for cannabis -minimizes problems, declines treatment  #Labs -EKG reviewed, sinus rhythm with QTc 451 -pregnancy test negative  #Smoking  -nicotine patch is available  #Disposition -discharge to home -follow up with [facility]  Physical Findings: AIMS:  , ,  ,  ,    CIWA:    COWS:     Musculoskeletal: Strength & Muscle Tone: within normal limits Gait & Station: normal Patient leans: N/A  Psychiatric Specialty Exam: Physical Exam  Nursing note and vitals reviewed. Psychiatric: She has a normal mood and affect. Her speech is normal and behavior is normal. Thought content normal. She expresses impulsivity.    Review of Systems  Neurological: Negative.   Psychiatric/Behavioral: Negative.   All other systems reviewed and are negative.   Blood pressure (!) 139/94, pulse 66, temperature  97.6 F (36.4 C), temperature source Oral, resp. rate 20, height 5\' 3"  (1.6 m), weight 59.4 kg, last menstrual period 12/17/2007, SpO2 100 %.Body mass index is 23.21 kg/m.  General Appearance: Casual  Eye Contact:  Good  Speech:  Clear and Coherent  Volume:  Normal  Mood:  Euthymic  Affect:  Appropriate  Thought Process:  Goal Directed and Descriptions of Associations: Intact  Orientation:  Full (Time, Place, and Person)  Thought Content:  WDL  Suicidal Thoughts:  No  Homicidal Thoughts:  No  Memory:  Immediate;   Fair Recent;   Fair Remote;   Fair  Judgement:  Poor  Insight:  Shallow  Psychomotor Activity:  Normal  Concentration:  Concentration: Fair and Attention Span: Fair  Recall:  [employer]Fair  Fund of Knowledge:  Fair  Language:  Fair  Akathisia:  No  Handed:  Left  AIMS (if indicated):     Assets:  Communication Skills Desire for Improvement Housing Physical Health Resilience Social Support  ADL's:  Intact  Cognition:  WNL  Sleep:  Number of Hours: 5.5     Have you used any form of tobacco in the last 30 days? (Cigarettes, Smokeless Tobacco, Cigars, and/or Pipes): Yes  Has this patient used any form of tobacco in the last 30 days? (Cigarettes, Smokeless Tobacco, Cigars, and/or Pipes) Yes, Yes, A prescription for an FDA-approved tobacco cessation medication was offered at discharge and the patient refused  Blood Alcohol level:  Lab Results  Component Value Date   ETH <10 08/31/2018   ETH <10 05/24/2018    Metabolic Disorder Labs:  No results found for: HGBA1C, MPG No results found for: PROLACTIN No results found for: CHOL, TRIG, HDL, CHOLHDL, VLDL, LDLCALC  See Psychiatric Specialty Exam and Suicide Risk Assessment completed by Attending Physician prior to discharge.  Discharge destination:  Home  Is patient on multiple antipsychotic therapies at discharge:  No   Has Patient had three or more failed trials of antipsychotic monotherapy by history:   No  Recommended Plan for Multiple Antipsychotic Therapies: NA  Discharge Instructions    Diet - low sodium heart healthy   Complete by:  As directed    Increase activity slowly   Complete by:  As directed      Allergies as of 09/03/2018      Reactions   Cyclobenzaprine    "makes the pt jumpy"   Naproxen Sodium    Intestinal bloating that was so bad she had to go to the ER      Medication List    STOP taking these medications   cyclobenzaprine 5 MG tablet Commonly known as:  FLEXERIL   diphenhydrAMINE 25 MG tablet Commonly known as:  BENADRYL   gabapentin 600 MG tablet Commonly known as:  NEURONTIN Replaced by:  gabapentin 300 MG capsule   ketorolac 10 MG tablet Commonly known as:  TORADOL   levETIRAcetam 500 MG tablet Commonly known as:  KEPPRA     TAKE these medications     Indication  gabapentin 300 MG capsule Commonly known as:  NEURONTIN Take 1 capsule (300 mg total) by mouth 4 (four) times daily. Replaces:  gabapentin 600 MG tablet  Indication:  Fibromyalgia Syndrome   mirtazapine 15 MG tablet Commonly known as:  REMERON Take 1 tablet (15 mg total) by mouth at bedtime for 30 days.  Indication:  Major Depressive Disorder   phenytoin 100 MG ER capsule Commonly known as:  DILANTIN Take 1 capsule (100 mg total) by mouth 3 (three) times daily.  Indication:  Seizure        Follow-up recommendations:  Activity:  as tolerated Diet:  low sodium heart healthy Other:  keep follow up appointments  Comments:    Signed: Kristine LineaJolanta Pucilowska, MD 09/03/2018, 2:54 PM

## 2018-09-03 NOTE — Progress Notes (Signed)
Patient alert and oriented x 4, denies SI/HI/AVH, affect is flat but brightens upon approach, appears less anxious, thoughts are organized and coherent, interacting appropriately with peers and staff. Patient c/o restless leg around bedtime and was medicated with klonopin x1 once with good effect. Patient was offered emotional support ane encouraged to attend evening group. 15 minutes safety checks maintained will continue to monitor.

## 2018-09-14 ENCOUNTER — Emergency Department: Payer: Medicaid Other

## 2018-09-14 ENCOUNTER — Emergency Department
Admission: EM | Admit: 2018-09-14 | Discharge: 2018-09-14 | Disposition: A | Discharge status: Home / Self Care | Payer: Medicaid Other | Attending: Emergency Medicine | Admitting: Emergency Medicine

## 2018-09-14 ENCOUNTER — Other Ambulatory Visit: Payer: Self-pay

## 2018-09-14 ENCOUNTER — Encounter: Payer: Self-pay | Admitting: Emergency Medicine

## 2018-09-14 DIAGNOSIS — F17210 Nicotine dependence, cigarettes, uncomplicated: Secondary | ICD-10-CM | POA: Diagnosis not present

## 2018-09-14 DIAGNOSIS — Z859 Personal history of malignant neoplasm, unspecified: Secondary | ICD-10-CM | POA: Insufficient documentation

## 2018-09-14 DIAGNOSIS — M542 Cervicalgia: Secondary | ICD-10-CM | POA: Insufficient documentation

## 2018-09-14 MED ORDER — BACLOFEN 10 MG PO TABS: 10.0000 mg | ORAL_TABLET | Freq: Every day | ORAL | 0 refills | Status: AC

## 2018-09-14 MED ORDER — LIDOCAINE 5 % EX PTCH
1.0000 | MEDICATED_PATCH | CUTANEOUS | Status: DC
  Administered 2018-09-14: 1 via TRANSDERMAL
  Filled 2018-09-14: qty 1

## 2018-09-14 MED ORDER — MELOXICAM 15 MG PO TABS: 15.0000 mg | ORAL_TABLET | Freq: Every day | ORAL | 0 refills | Status: AC

## 2018-09-14 MED ORDER — LIDOCAINE 5 % EX PTCH: 1.0000 | MEDICATED_PATCH | CUTANEOUS | 0 refills | Status: DC

## 2018-09-14 MED ORDER — ORPHENADRINE CITRATE 30 MG/ML IJ SOLN
60.0000 mg | Freq: Two times a day (BID) | INTRAMUSCULAR | Status: DC
  Administered 2018-09-14: 60 mg via INTRAMUSCULAR
  Filled 2018-09-14: qty 2

## 2018-09-14 MED ORDER — KETOROLAC TROMETHAMINE 30 MG/ML IJ SOLN
30.0000 mg | Freq: Once | INTRAMUSCULAR | Status: AC
  Administered 2018-09-14: 30 mg via INTRAMUSCULAR
  Filled 2018-09-14: qty 1

## 2018-09-14 NOTE — ED Provider Notes (Signed)
[facility] Emergency Department Provider Note  ____________________________________________  Time seen: Approximately 4:03 PM  I have reviewed the triage vital signs and the nursing notes.   HISTORY  Chief Complaint Neck Pain    HPI Nicole Chandler is a 58 y.o. female that presents to the emergency department for evaluation of left-sided neck pain for 3 days.  Pain is better with left rotation of neck.  Pain is worse with right rotation of neck.  With pain does not radiate.  No trauma.  Patient denies any IV drug use.  Patient was seen in the emergency department 2 weeks ago for depression and denies any current concerns with depression.  No suicidal ideations.   Past Medical History:  Diagnosis Date  . Arthritis   . Back pain   . Bipolar 1 disorder (HCC)   . Cancer (HCC)   . Chronic back pain unk  . Depression   . Panic attacks   . Sciatica   . Seizures [facility](HCC)     Patient Active Problem List   Diagnosis Date Noted  . Suicidal thoughts 09/01/2018  . Major depressive disorder, recurrent severe without psychotic features (HCC) 09/01/2018  . Major depressive disorder, single episode, severe without psychosis (HCC) 09/01/2018  . Postictal state (HCC) 11/13/2017  . 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 Seizure (HCC) 03/24/2016  . Substance induced mood disorder (HCC) 03/05/2016  . Opioid use disorder, severe, dependence (HCC) 02/25/2016  . Cocaine use disorder, moderate, dependence (HCC) 02/25/2016  . Tobacco use disorder 02/25/2016  . Opioid-induced mood disorder (HCC) 02/25/2016  . Cannabis use disorder, moderate, dependence (HCC) 02/25/2016    Past Surgical History:  Procedure Laterality Date  . ABDOMINAL SURGERY    . EYE SURGERY      Prior to Admission medications   Medication Sig Start Date End Date Taking? Authorizing Provider  baclofen (LIORESAL) 10 MG tablet Take 1 tablet (10 mg total) by  mouth daily. 09/14/18 09/14/19  Enid DerryWagner, Mckinley Adelstein, PA-C  gabapentin (NEURONTIN) 300 MG capsule Take 1 capsule (300 mg total) by mouth 4 (four) times daily. 09/03/18   Pucilowska, Jolanta B, MD  lidocaine (LIDODERM) 5 % Place 1 patch onto the skin daily. Remove & Discard patch within 12 hours or as directed by MD 09/14/18   Enid DerryWagner, Lunabelle Oatley, PA-C  meloxicam (MOBIC) 15 MG tablet Take 1 tablet (15 mg total) by mouth daily for 10 days. 09/14/18 09/24/18  Enid DerryWagner, Vivyan Biggers, PA-C  mirtazapine (REMERON) 15 MG tablet Take 1 tablet (15 mg total) by mouth at bedtime for 30 days. 09/03/18 10/03/18  Pucilowska, Ellin GoodieJolanta B, MD  phenytoin (DILANTIN) 100 MG ER capsule Take 1 capsule (100 mg total) by mouth 3 (three) times daily. 09/03/18 12/02/18  Pucilowska, Ellin GoodieJolanta B, MD    Allergies Cyclobenzaprine and Naproxen sodium  Family History  Problem Relation Age of Onset  . Alzheimer's disease Mother   . AAA (abdominal aortic aneurysm) Father   . Breast cancer Sister [id number]    Social History Social History   Tobacco Use  . Smoking status: Current Every Day Smoker    Packs/day: 0.50    Types: Cigarettes    Last attempt to quit: 08/27/2014    Years since quitting: 4.0  . Smokeless tobacco: Never Used  Substance Use Topics  . Alcohol use: No  . Drug use: Yes    Types: Cocaine, Marijuana     Review of Systems  Constitutional: No fever/chills Cardiovascular: No chest  pain. Respiratory: No SOB. Gastrointestinal:No nausea, no vomiting.  Musculoskeletal: Positive for neck pain.  Skin: Negative for rash, abrasions, lacerations, ecchymosis. Neurological: Negative for numbness or tingling   ____________________________________________   PHYSICAL EXAM:  VITAL SIGNS: ED Triage Vitals [09/14/18 1550]  Enc Vitals Group     BP (!) 132/91     Pulse Rate 84     Resp 18     Temp 98.2 F (36.8 C)     Temp Source Oral     SpO2 95 %     Weight 145 lb (65.8 kg)     Height 5\' 2"  (1.575 m)     Head Circumference      Peak  Flow      Pain Score 10     Pain Loc      Pain Edu?      Excl. in GC?      Constitutional: Alert and oriented. Well appearing and in no acute distress. Eyes: Conjunctivae are normal. PERRL. EOMI. Head: Atraumatic. ENT:      Ears:      Nose: No congestion/rhinnorhea.      Mouth/Throat: Mucous membranes are moist.  Neck: No stridor.  No cervical spine tenderness to palpation. Tenderness to palpation of left trapezius. Patient hold neck twisted to the left.  Cardiovascular: Normal rate, regular rhythm.  Good peripheral circulation. Symmetric radial pulses bilaterally.  Respiratory: Normal respiratory effort without tachypnea or retractions. Lungs CTAB. Good air entry to the bases with no decreased or absent breath sounds. Musculoskeletal: Full range of motion to all extremities. No gross deformities appreciated. Full ROM of left shoulder.  Neurologic:  Normal speech and language. No gross focal neurologic deficits are appreciated.  Skin:  Skin is warm, dry and intact. No rash noted. Psychiatric: Mood and affect are normal. Speech and behavior are normal. Patient exhibits appropriate insight and judgement.   ____________________________________________   LABS (all labs ordered are listed, but only abnormal results are displayed)  Labs Reviewed - No data to display ____________________________________________  EKG   ____________________________________________  RADIOLOGY Lexine Baton, personally viewed and evaluated these images (plain radiographs) as part of my medical decision making, as well as reviewing the written report by the radiologist.  Dg Cervical Spine 2-3 Views  Result Date: 09/14/2018 CLINICAL DATA:  Pain in the neck for 3 days EXAM: CERVICAL SPINE - 2-3 VIEW COMPARISON:  None. FINDINGS: There is no evidence of cervical spine fracture or prevertebral soft tissue swelling. Alignment is normal. No other significant bone abnormalities are identified. IMPRESSION:  Negative cervical spine radiographs. Electronically Signed   By: Elige Ko   On: 09/14/2018 16:52   Dg Shoulder Left  Result Date: 09/14/2018 CLINICAL DATA:  Severe neck pain. EXAM: LEFT SHOULDER - 2+ VIEW COMPARISON:  None. FINDINGS: There is no evidence of fracture or dislocation. There is no evidence of arthropathy or other focal bone abnormality. Soft tissues are unremarkable. IMPRESSION: Negative. Electronically Signed   By: Ted Mcalpine M.D.   On: 09/14/2018 16:52    ____________________________________________    PROCEDURES  Procedure(s) performed:    Procedures    Medications  orphenadrine (NORFLEX) injection 60 mg (60 mg Intramuscular Given 09/14/18 1643)  lidocaine (LIDODERM) 5 % 1 patch (1 patch Transdermal Patch Applied 09/14/18 1643)  ketorolac (TORADOL) 30 MG/ML injection 30 mg (30 mg Intramuscular Given 09/14/18 1643)     ____________________________________________   INITIAL IMPRESSION / ASSESSMENT AND PLAN / ED COURSE  Pertinent labs & imaging results  that were available during my care of the patient were reviewed by me and considered in my medical decision making (see chart for details).  Review of the [place] CSRS was performed in accordance of the [facility] prior to dispensing any controlled drugs.     Patient presented the emergency department for evaluation of torticollis.  Vital signs and exam are reassuring.  X-ray negative for acute abnormalities.  Patient stated pain significantly improved with Toradol and Norflex.  Patient will be discharged home with prescriptions for a short course of Mobic and baclofen. Patient is to follow up with primary care as directed. Patient is given ED precautions to return to the ED for any worsening or new symptoms.     ____________________________________________  FINAL CLINICAL IMPRESSION(S) / ED DIAGNOSES  Final diagnoses:  Neck pain      NEW MEDICATIONS STARTED DURING THIS VISIT:  ED Discharge Orders          Ordered    baclofen (LIORESAL) 10 MG tablet  Daily     09/14/18 1722    meloxicam (MOBIC) 15 MG tablet  Daily     09/14/18 1722    lidocaine (LIDODERM) 5 %  Every 24 hours     09/14/18 1722              This chart was dictated using voice recognition software/Dragon. Despite best efforts to proofread, errors can occur which can change the meaning. Any change was purely unintentional.    Enid DerryWagner, Nelida Mandarino, PA-C 09/14/18 1821    Sharyn CreamerQuale, Mark, MD 09/15/18 [phone]

## 2018-09-14 NOTE — ED Triage Notes (Signed)
Patient reports pain in neck x3 days. States pain is worse with movement and has been keeping her awake at night.

## 2018-09-14 NOTE — ED Notes (Signed)
Gave pt ginger ale,crackers and peanut butter. Checked with RN Bonita Quin before giving.

## 2018-09-14 NOTE — ED Notes (Signed)
See triage note  Presents with pain to left side of neck  States pain started 3 days ago w/o injury  Pain moves from neck into left shoulder /arm  Pt is tearful on arrival  States she has been using ice and gabapentin w/o relief

## 2018-09-22 ENCOUNTER — Ambulatory Visit: Payer: Medicaid Other | Admitting: Pharmacy Technician

## 2018-09-22 ENCOUNTER — Ambulatory Visit: Payer: Medicaid Other | Admitting: Pharmacist

## 2018-09-22 ENCOUNTER — Encounter: Payer: Self-pay | Admitting: Pharmacist

## 2018-09-22 ENCOUNTER — Other Ambulatory Visit: Payer: Self-pay

## 2018-09-22 VITALS — BP 120/78 | Wt 140.0 lb

## 2018-09-22 DIAGNOSIS — Z79899 Other long term (current) drug therapy: Secondary | ICD-10-CM
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
## 2018-09-22 NOTE — Progress Notes (Signed)
[facility] Visit Note  Patient: Nicole Chandler MRN: [id number] Date of Birth: 1960-08-31 PCP: System, Pcp Not In   [place] Ray 58 y.o. female presents for a medication management visit today.  BP 120/78 (BP Location: Right Arm, Patient Position: Sitting, Cuff Size: Normal)   Wt 140 lb (63.5 kg)   LMP 12/17/2007 (Approximate)   BMI 25.61 kg/m   Patient Information   Past Medical History:  Diagnosis Date  . Arthritis   . Back pain   . Bipolar 1 disorder (HCC)   . Chronic back pain unk  . Depression   . Panic attacks   . Sciatica   . Seizures (HCC)       Past Surgical History:  Procedure Laterality Date  . ABDOMINAL SURGERY    . EYE SURGERY       Family History  Problem Relation Age of Onset  . Alzheimer's disease Mother   . AAA (abdominal aortic aneurysm) Father   . Breast cancer Sister 78  . Heart disease Brother     Family Support: None  Lifestyle Diet: Breakfast: toast, coffee Lunch: roast, cooks a Museum/gallery exhibitions officer: cooks, Set designer and canned vegetables Drinks: Pepsi    Current Exercise Habits: Home exercise routine, Type of exercise: walking, Time (Minutes): 15, Frequency (Times/Week): 7, Weekly Exercise (Minutes/Week): 105, Intensity: Mild       Social History   Substance and Sexual Activity  Alcohol Use No      Social History   Tobacco Use  Smoking Status Current Every Day Smoker  . Packs/day: 1.00  . Types: Cigarettes  . Last attempt to quit: 08/27/2014  . Years since quitting: 4.0  Smokeless Tobacco Never Used      Health Maintenance  Topic Date Due  . Hepatitis C Screening  Oct 17, 1960  . HIV Screening  04/14/1976  . TETANUS/TDAP  04/14/1980  . PAP SMEAR-Modifier  04/14/1982  . MAMMOGRAM  04/15/2011  . COLONOSCOPY  04/15/2011  . INFLUENZA VACCINE  03/25/2018   Outpatient Encounter Medications as of 09/22/2018  Medication Sig  . baclofen (LIORESAL) 10 MG tablet Take 1 tablet (10 mg total) by mouth daily.  Marland Kitchen  gabapentin (NEURONTIN) 300 MG capsule Take 1 capsule (300 mg total) by mouth 4 (four) times daily.  Marland Kitchen lidocaine (LIDODERM) 5 % Place 1 patch onto the skin daily. Remove & Discard patch within 12 hours or as directed by MD  . mirtazapine (REMERON) 15 MG tablet Take 1 tablet (15 mg total) by mouth at bedtime for 30 days.  . phenytoin (DILANTIN) 100 MG ER capsule Take 1 capsule (100 mg total) by mouth 3 (three) times daily.  . meloxicam (MOBIC) 15 MG tablet Take 1 tablet (15 mg total) by mouth daily for 10 days. (Patient not taking: Reported on 09/22/2018)   No facility-administered encounter medications on file as of 09/22/2018.    Health Maintenance/Date Completed  Last ED visit: this month Last Visit to PCP: 3 weeks Next Visit to PCP: next month Specialist Visit: Jeanice Lim at hospice (psychiatry?) Dental Exam: going to [facility] hopefully Eye Exam: no Prostate Exam: NA Pelvic/PAP Exam: been a while Mammogram: last year or year before DEXA: NA Colonoscopy: yes Flu Vaccine: no Pneumonia Vaccine: no    Assessment and Plan: Anxiety/depression: mirtazipine. Helps her sleep. Going to see Jeanice Lim to talk about things. Does not report any issues with medications and feels like it helps her.  Arthritis/nerve pain: gabapentin. Helps with her pain.  Seizures: phenytoin. Reports having a "  spell" the other week. Fell in kitchen, but does not think she had a seizure. She says she is well-controlled on the phenytoin.  Neck spasms/pain: baclofen, meloxicam, lidocaine patches. Went to ED about a week ago and was given these medications. Reports medications do help.  Smoking cessation: 1 PPD. Interested in quitting with the patches. Discussed [place] Quit Line which she says she has information about at home.  Compliance: does not miss doses.  I will have her return in 1 year for MTM.  Pricilla RiffleAbby K Tagg Eustice, PharmD Pharmacy Resident

## 2018-09-30 ENCOUNTER — Ambulatory Visit: Payer: Self-pay

## 2018-09-30 ENCOUNTER — Institutional Professional Consult (permissible substitution): Payer: Self-pay | Admitting: Licensed Clinical Social Worker

## 2018-10-05 ENCOUNTER — Institutional Professional Consult (permissible substitution): Payer: Self-pay | Admitting: Licensed Clinical Social Worker

## 2018-10-05 ENCOUNTER — Ambulatory Visit: Payer: Self-pay | Admitting: Family Medicine

## 2018-10-05 VITALS — BP 122/84 | HR 69 | Temp 98.0°F | Ht 62.0 in | Wt 138.5 lb

## 2018-10-05 DIAGNOSIS — M7989 Other specified soft tissue disorders: Secondary | ICD-10-CM

## 2018-10-05 DIAGNOSIS — E8809 Other disorders of plasma-protein metabolism, not elsewhere classified: Secondary | ICD-10-CM

## 2018-10-05 DIAGNOSIS — R718 Other abnormality of red blood cells: Secondary | ICD-10-CM

## 2018-10-05 DIAGNOSIS — D582 Other hemoglobinopathies: Secondary | ICD-10-CM

## 2018-10-05 DIAGNOSIS — R030 Elevated blood-pressure reading, without diagnosis of hypertension: Secondary | ICD-10-CM

## 2018-10-05 DIAGNOSIS — R7309 Other abnormal glucose: Secondary | ICD-10-CM

## 2018-10-05 DIAGNOSIS — K032 Erosion of teeth: Secondary | ICD-10-CM

## 2018-10-05 DIAGNOSIS — M2550 Pain in unspecified joint: Secondary | ICD-10-CM
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
## 2018-10-05 NOTE — Patient Instructions (Signed)
We'll get labs tonight We'll get xrays of the knees and ultrasound of the place behind the left shoulder (show them the spot) We'll have you see the dentist Tylenol per package directions I do encourage you to quit smoking Call [phone] to sign up for smoking cessation classes You can call 1-800-QUIT-NOW to talk with a smoking cessation coach Try to follow the DASH guidelines (DASH stands for Dietary Approaches to Stop Hypertension). Try to limit the sodium in your diet to no more than 1,500mg  of sodium per day. Certainly try to not exceed 2,000 mg per day at the very most. Do not add salt when cooking or at the table.  Check the sodium amount on labels when shopping, and choose items lower in sodium when given a choice. Avoid or limit foods that already contain a lot of sodium. Eat a diet rich in fruits and vegetables and whole grains, and try to lose weight if overweight or obese

## 2018-10-08 LAB — PROTEIN ELECTROPHORESIS, SERUM
A/G Ratio: 1.2 (ref 0.7–1.7)
ALPHA 2: 1 g/dL (ref 0.4–1.0)
Albumin ELP: 4.1 g/dL (ref 2.9–4.4)
Alpha 1: 0.2 g/dL (ref 0.0–0.4)
BETA: 1.2 g/dL (ref 0.7–1.3)
Gamma Globulin: 1 g/dL (ref 0.4–1.8)
Globulin, Total: 3.5 g/dL (ref 2.2–3.9)

## 2018-10-08 LAB — COMPREHENSIVE METABOLIC PANEL
ALT: 7 IU/L (ref 0–32)
AST: 19 IU/L (ref 0–40)
Albumin/Globulin Ratio: 1.6 (ref 1.2–2.2)
Albumin: 4.7 g/dL (ref 3.8–4.9)
Alkaline Phosphatase: 123 IU/L — ABNORMAL HIGH (ref 39–117)
BUN/Creatinine Ratio: 8 — ABNORMAL LOW (ref 9–23)
BUN: 7 mg/dL (ref 6–24)
Bilirubin Total: <0.2 mg/dL (ref 0.0–1.2)
CO2: 21 mmol/L (ref 20–29)
Calcium: 9.8 mg/dL (ref 8.7–10.2)
Chloride: 98 mmol/L (ref 96–106)
Creatinine, Ser: 0.92 mg/dL (ref 0.57–1.00)
GFR calc Af Amer: 80 mL/min/{1.73_m2} (ref >59)
GFR calc non Af Amer: 69 mL/min/{1.73_m2} (ref >59)
GLUCOSE: 89 mg/dL (ref 65–99)
Globulin, Total: 2.9 g/dL (ref 1.5–4.5)
Potassium: 4.3 mmol/L (ref 3.5–5.2)
Sodium: 136 mmol/L (ref 134–144)
Total Protein: 7.6 g/dL (ref 6.0–8.5)

## 2018-10-08 LAB — ANA,IFA RA DIAG PNL W/RFLX TIT/PATN
ANA Titer 1: NEGATIVE
Cyclic Citrullin Peptide Ab: 7 units (ref 0–19)
Rheumatoid fact SerPl-aCnc: 10.7 IU/mL (ref 0.0–13.9)

## 2018-10-08 LAB — CBC WITH DIFFERENTIAL/PLATELET
Basophils Absolute: 0.1 10*3/uL (ref 0.0–0.2)
Basos: 1 %
EOS (ABSOLUTE): 0.4 10*3/uL (ref 0.0–0.4)
Eos: 4 %
Hematocrit: 44.4 % (ref 34.0–46.6)
Hemoglobin: 15.1 g/dL (ref 11.1–15.9)
Immature Grans (Abs): 0.1 10*3/uL (ref 0.0–0.1)
Immature Granulocytes: 1 %
LYMPHS: 39 %
Lymphocytes Absolute: 4 10*3/uL — ABNORMAL HIGH (ref 0.7–3.1)
MCH: 30.9 pg (ref 26.6–33.0)
MCHC: 34 g/dL (ref 31.5–35.7)
MCV: 91 fL (ref 79–97)
Monocytes Absolute: 0.7 10*3/uL (ref 0.1–0.9)
Monocytes: 7 %
Neutrophils Absolute: 4.9 10*3/uL (ref 1.4–7.0)
Neutrophils: 48 %
PLATELETS: 440 10*3/uL (ref 150–450)
RBC: 4.89 x10E6/uL (ref 3.77–5.28)
RDW: 12.8 % (ref 11.7–15.4)
WBC: 10.1 10*3/uL (ref 3.4–10.8)

## 2018-10-08 LAB — TSH: TSH: 6.37 u[IU]/mL — AB (ref 0.450–4.500)

## 2018-10-08 LAB — VITAMIN D 25 HYDROXY (VIT D DEFICIENCY, FRACTURES): Vit D, 25-Hydroxy: 9.3 ng/mL — ABNORMAL LOW (ref 30.0–100.0)

## 2018-10-08 LAB — HEMOGLOBIN A1C
Est. average glucose Bld gHb Est-mCnc: 114 mg/dL
Hgb A1c MFr Bld: 5.6 % (ref 4.8–5.6)

## 2018-10-21 ENCOUNTER — Other Ambulatory Visit: Payer: Self-pay | Admitting: Adult Health Nurse Practitioner

## 2018-10-21 ENCOUNTER — Ambulatory Visit: Payer: Medicaid Other | Admitting: Adult Health Nurse Practitioner

## 2018-10-21 ENCOUNTER — Institutional Professional Consult (permissible substitution): Payer: Medicaid Other | Admitting: Licensed Clinical Social Worker

## 2018-10-21 ENCOUNTER — Ambulatory Visit: Payer: Medicaid Other

## 2018-10-21 VITALS — BP 125/80 | HR 59 | Temp 97.5°F | Ht 62.0 in | Wt 138.1 lb

## 2018-10-21 DIAGNOSIS — J111 Influenza due to unidentified influenza virus with other respiratory manifestations: Secondary | ICD-10-CM | POA: Insufficient documentation

## 2018-10-21 NOTE — Progress Notes (Signed)
  Patient: Nicole Chandler Female    DOB: 1961/06/15   58 y.o.   MRN: [id number] Visit Date: 10/21/2018  Today's Provider: [facility]   Chief Complaint  Patient presents with  . Follow-up    Pt has not had x-ray's done yet  . Nausea    since 10/21/18 @ 4AM   . Emesis  . Dizziness   Subjective:    HPI    Cold symptoms since Saturday.  Vomiting since 4am this morning.  Was in contact with someone sick on Sunday.    Allergies  Allergen Reactions  . Cyclobenzaprine     "makes the pt jumpy"  . Naproxen Sodium     Intestinal bloating that was so bad she had to go to the ER   Previous Medications   BACLOFEN (LIORESAL) 10 MG TABLET    Take 1 tablet (10 mg total) by mouth daily.   GABAPENTIN (NEURONTIN) 300 MG CAPSULE    Take 1 capsule (300 mg total) by mouth 4 (four) times daily.   LIDOCAINE (LIDODERM) 5 %    Place 1 patch onto the skin daily. Remove & Discard patch within 12 hours or as directed by MD   MIRTAZAPINE (REMERON) 15 MG TABLET    Take 1 tablet (15 mg total) by mouth at bedtime for 30 days.   PHENYTOIN (DILANTIN) 100 MG ER CAPSULE    Take 1 capsule (100 mg total) by mouth 3 (three) times daily.    Review of Systems  Constitutional: Negative for chills and fever.  HENT: Positive for congestion.   Respiratory: Positive for cough.   Gastrointestinal: Positive for diarrhea, nausea and vomiting.    Social History   Tobacco Use  . Smoking status: Current Every Day Smoker    Packs/day: 1.00    Types: Cigarettes    Last attempt to quit: 08/27/2014    Years since quitting: 4.1  . Smokeless tobacco: Never Used  Substance Use Topics  . Alcohol use: No   Objective:   BP 125/80 (BP Location: Right Arm, Patient Position: Sitting, Cuff Size: Normal)   Pulse (!) 59   Temp (!) 97.5 F (36.4 C) (Oral)   Ht 5\' 2"  (1.575 m)   Wt 138 lb 1.6 oz (62.6 kg)   LMP 12/17/2007 (Approximate)   BMI 25.26 kg/m   Physical Exam Cardiovascular:     Rate and Rhythm:  Normal rate and regular rhythm.  Pulmonary:     Effort: Pulmonary effort is normal.     Breath sounds: Normal breath sounds.  Abdominal:     General: Bowel sounds are normal.     Palpations: Abdomen is soft.     Tenderness: There is abdominal tenderness.         Assessment & Plan:        Recommended visit to the ED for probable flu.  Supportive care.  Hydrate with fluids- pedialyte/gatorade.     [facility]   [facility]

## 2018-10-22 ENCOUNTER — Telehealth: Payer: Self-pay

## 2018-10-22 NOTE — Telephone Encounter (Signed)
-----   Message from Jacelyn Pi, NP sent at 10/21/2018  6:59 PM EST ----- Can you make sure patient knows she has a FU on 3.3. Her TSH was elevated on her labs and we will discuss medications at that time.   thanks

## 2018-10-22 NOTE — Telephone Encounter (Signed)
Called pt to remind her of appt on 3/3 at 6pm. She stated she was feeling much better this morning. She vomited on her way home after [facility] visit last night and felt better after this. She will reschedule appt with Herbert Seta next week.

## 2018-10-26 ENCOUNTER — Ambulatory Visit: Payer: Medicaid Other

## 2018-11-11 ENCOUNTER — Other Ambulatory Visit: Payer: Self-pay

## 2018-11-11 ENCOUNTER — Emergency Department
Admission: EM | Admit: 2018-11-11 | Discharge: 2018-11-11 | Disposition: A | Discharge status: Home / Self Care | Payer: Medicaid Other | Attending: Emergency Medicine | Admitting: Emergency Medicine

## 2018-11-11 ENCOUNTER — Encounter: Payer: Self-pay | Admitting: Emergency Medicine

## 2018-11-11 DIAGNOSIS — Z79899 Other long term (current) drug therapy: Secondary | ICD-10-CM | POA: Diagnosis not present

## 2018-11-11 DIAGNOSIS — W548XXA Other contact with dog, initial encounter: Secondary | ICD-10-CM | POA: Diagnosis not present

## 2018-11-11 DIAGNOSIS — Y93K9 Activity, other involving animal care: Secondary | ICD-10-CM | POA: Diagnosis not present

## 2018-11-11 DIAGNOSIS — S3992XA Unspecified injury of lower back, initial encounter: Secondary | ICD-10-CM | POA: Diagnosis present

## 2018-11-11 DIAGNOSIS — Y929 Unspecified place or not applicable: Secondary | ICD-10-CM | POA: Diagnosis not present

## 2018-11-11 DIAGNOSIS — F17210 Nicotine dependence, cigarettes, uncomplicated: Secondary | ICD-10-CM | POA: Diagnosis not present

## 2018-11-11 DIAGNOSIS — Y999 Unspecified external cause status: Secondary | ICD-10-CM | POA: Insufficient documentation

## 2018-11-11 DIAGNOSIS — S39012A Strain of muscle, fascia and tendon of lower back, initial encounter: Secondary | ICD-10-CM

## 2018-11-11 MED ORDER — METHOCARBAMOL 500 MG PO TABS: 500.0000 mg | ORAL_TABLET | Freq: Four times a day (QID) | ORAL | 0 refills | Status: DC

## 2018-11-11 MED ORDER — KETOROLAC TROMETHAMINE 30 MG/ML IJ SOLN
30.0000 mg | Freq: Once | INTRAMUSCULAR | Status: AC
  Administered 2018-11-11: 30 mg via INTRAMUSCULAR
  Filled 2018-11-11: qty 1

## 2018-11-11 MED ORDER — ORPHENADRINE CITRATE 30 MG/ML IJ SOLN
60.0000 mg | Freq: Once | INTRAMUSCULAR | Status: AC
  Administered 2018-11-11: 60 mg via INTRAMUSCULAR
  Filled 2018-11-11: qty 2

## 2018-11-11 MED ORDER — ONDANSETRON 4 MG PO TBDP: 4.0000 mg | ORAL_TABLET | Freq: Three times a day (TID) | ORAL | 0 refills | Status: DC | PRN

## 2018-11-11 MED ORDER — MELOXICAM 15 MG PO TABS: 15.0000 mg | ORAL_TABLET | Freq: Every day | ORAL | 0 refills | Status: DC

## 2018-11-11 NOTE — ED Provider Notes (Signed)
[facility] Emergency Department Provider Note  ____________________________________________  Time seen: Approximately 4:04 PM  I have reviewed the triage vital signs and the nursing notes.   HISTORY  Chief Complaint Back Pain    HPI Nicole Chandler is a 58 y.o. female who presents the emergency department complaining of severe right-sided lower back pain.  Patient reports that approximately a week ago she was trying to take her dog off the chain when he jumped, while she was holding him pulling a muscle in her back.  Patient was seen by her primary care, was given 5 tablets of tizanidine.  Patient reports that this helped somewhat but did not alleviate her symptoms and the symptoms are worsening.  She does have a history of chronic back pain as well as sciatica.  She denies any sciatica symptoms at this time.  No bowel or bladder dysfunction, saddle anesthesia, paresthesias.  Patient is on Suboxone for chronic pain.  She is also on gabapentin, Tylenol, 5 doses of tizanidine.  Patient reports that symptoms have not improved and she is concerned that if there is not improvement she will develop further radicular symptoms.  Patient denies any trauma to the lower back.  Patient states that she is unable to take NSAIDs as they make her "sick to my stomach."  No history of chronic kidney disease or gastric ulcer.         Past Medical History:  Diagnosis Date  . Arthritis   . Back pain   . Bipolar 1 disorder (HCC)   . Chronic back pain unk  . Depression   . Panic attacks   . Sciatica   . Seizures [facility])     Patient Active Problem List   Diagnosis Date Noted  . Influenza 10/21/2018  . Suicidal thoughts 09/01/2018  . Major depressive disorder, recurrent severe without psychotic features (HCC) 09/01/2018  . Major depressive disorder, single episode, severe without psychosis (HCC) 09/01/2018  . Postictal state (HCC) 11/13/2017  . Adjustment disorder with mixed  disturbance of emotions and conduct 07/08/2017  . Seizure (HCC) 03/24/2016  . Substance induced mood disorder (HCC) 03/05/2016  . Opioid use disorder, severe, dependence (HCC) 02/25/2016  . Cocaine use disorder, moderate, dependence (HCC) 02/25/2016  . Tobacco use disorder 02/25/2016  . Opioid-induced mood disorder (HCC) 02/25/2016  . Cannabis use disorder, moderate, dependence (HCC) 02/25/2016    Past Surgical History:  Procedure Laterality Date  . ABDOMINAL SURGERY    . EYE SURGERY      Prior to Admission medications   Medication Sig Start Date End Date Taking? Authorizing Provider  baclofen (LIORESAL) 10 MG tablet Take 1 tablet (10 mg total) by mouth daily. 09/14/18 09/14/19  Enid Derry, PA-C  gabapentin (NEURONTIN) 300 MG capsule Take 1 capsule (300 mg total) by mouth 4 (four) times daily. 09/03/18   Pucilowska, Jolanta B, MD  lidocaine (LIDODERM) 5 % Place 1 patch onto the skin daily. Remove & Discard patch within 12 hours or as directed by MD 09/14/18   Enid Derry, PA-C  meloxicam (MOBIC) 15 MG tablet Take 1 tablet (15 mg total) by mouth daily. 11/11/18   Sherrel Ploch, Delorise Royals, PA-C  methocarbamol (ROBAXIN) 500 MG tablet Take 1 tablet (500 mg total) by mouth 4 (four) times daily. 11/11/18   Daymond Cordts, Delorise Royals, PA-C  mirtazapine (REMERON) 15 MG tablet Take 1 tablet (15 mg total) by mouth at bedtime for 30 days. 09/03/18 10/03/18  Pucilowska, Ellin Goodie, MD  ondansetron (ZOFRAN-ODT) 4  MG disintegrating tablet Take 1 tablet (4 mg total) by mouth every 8 (eight) hours as needed for nausea or vomiting. 11/11/18   Elchonon Maxson, Delorise Royals, PA-C  phenytoin (DILANTIN) 100 MG ER capsule Take 1 capsule (100 mg total) by mouth 3 (three) times daily. 09/03/18 12/02/18  Pucilowska, Ellin Goodie, MD    Allergies Cyclobenzaprine and Naproxen sodium  Family History  Problem Relation Age of Onset  . Alzheimer's disease Mother   . AAA (abdominal aortic aneurysm) Father   . Breast cancer Sister 40  .  Heart disease Brother     Social History Social History   Tobacco Use  . Smoking status: Current Every Day Smoker    Packs/day: 1.00    Types: Cigarettes    Last attempt to quit: 08/27/2014    Years since quitting: 4.2  . Smokeless tobacco: Never Used  Substance Use Topics  . Alcohol use: No  . Drug use: Not Currently    Types: Cocaine, Marijuana     Review of Systems  Constitutional: No fever/chills Eyes: No visual changes.  Cardiovascular: no chest pain. Respiratory: no cough. No SOB. Gastrointestinal: No abdominal pain.  No nausea, no vomiting.  No diarrhea.  No constipation. Genitourinary: Negative for dysuria. No hematuria Musculoskeletal: Positive for right-sided lower back pain Skin: Negative for rash, abrasions, lacerations, ecchymosis. Neurological: Negative for headaches, focal weakness or numbness. 10-point ROS otherwise negative.  ____________________________________________   PHYSICAL EXAM:  VITAL SIGNS: ED Triage Vitals  Enc Vitals Group     BP 11/11/18 1442 (!) 141/103     Pulse Rate 11/11/18 1442 78     Resp 11/11/18 1442 20     Temp 11/11/18 1442 98.2 F (36.8 C)     Temp Source 11/11/18 1442 Oral     SpO2 11/11/18 1442 96 %     Weight 11/11/18 1439 132 lb (59.9 kg)     Height 11/11/18 1439 5\' 2"  (1.575 m)     Head Circumference --      Peak Flow --      Pain Score 11/11/18 1439 10     Pain Loc --      Pain Edu? --      Excl. in GC? --      Constitutional: Alert and oriented. Well appearing and in no acute distress. Eyes: Conjunctivae are normal. PERRL. EOMI. Head: Atraumatic. Neck: No stridor.    Cardiovascular: Normal rate, regular rhythm. Normal S1 and S2.  Good peripheral circulation. Respiratory: Normal respiratory effort without tachypnea or retractions. Lungs CTAB. Good air entry to the bases with no decreased or absent breath sounds. Gastrointestinal: Bowel sounds 4 quadrants. Soft and nontender to palpation. No guarding or  rigidity. No palpable masses. No distention. No CVA tenderness. Musculoskeletal: Full range of motion to all extremities. No gross deformities appreciated.  Visualization of the lumbar spine reveals no visible abnormality.  Patient is diffusely tender to palpation throughout the right paraspinal muscle group.  No midline tenderness.  Mild tenderness to palpation of the right-sided sciatic notch.  Dorsalis pedis pulse intact bilateral lower extremities.  Sensation intact and equal bilateral lower extremity. Neurologic:  Normal speech and language. No gross focal neurologic deficits are appreciated.  Skin:  Skin is warm, dry and intact. No rash noted. Psychiatric: Mood and affect are normal. Speech and behavior are normal. Patient exhibits appropriate insight and judgement.   ____________________________________________   LABS (all labs ordered are listed, but only abnormal results are displayed)  Labs Reviewed -  No data to display ____________________________________________  EKG   ____________________________________________  RADIOLOGY   No results found.  ____________________________________________    PROCEDURES  Procedure(s) performed:    Procedures    Medications  ketorolac (TORADOL) 30 MG/ML injection 30 mg (has no administration in time range)  orphenadrine (NORFLEX) injection 60 mg (has no administration in time range)     ____________________________________________   INITIAL IMPRESSION / ASSESSMENT AND PLAN / ED COURSE  Pertinent labs & imaging results that were available during my care of the patient were reviewed by me and considered in my medical decision making (see chart for details).  Review of the [place] CSRS was performed in accordance of the [facility] prior to dispensing any controlled drugs.           Patient's diagnosis is consistent with lumbar strain.  Patient presents emergency department complaining of ongoing lower back pain after an injury a  week ago.  Patient was given 5 doses of muscle relaxer which she states has not alleviated her symptoms.  Patient was taking gabapentin, Suboxone, Tylenol in addition to muscle relaxer with no relief.  On exam, no indication for imaging at this time.  I discussed the need for further muscle relaxer as well as anti-inflammatory medication.  I gave the patient options of prednisone which she did not want due to the side effects or anti-inflammatory with nausea medicine.  Patient endorses a history of upset stomach, occasional emesis with NSAIDs.  No history of chronic kidney disease or gastric ulcer.  Given the option of anti-inflammatory with Zofran or prednisone the patient uses meloxicam and Zofran and a muscle relaxer.  Patient is given Toradol and Norflex in the emergency department.  Follow-up primary care..  Patient is given ED precautions to return to the ED for any worsening or new symptoms.     ____________________________________________  FINAL CLINICAL IMPRESSION(S) / ED DIAGNOSES  Final diagnoses:  Strain of lumbar region, initial encounter      NEW MEDICATIONS STARTED DURING THIS VISIT:  ED Discharge Orders         Ordered    meloxicam (MOBIC) 15 MG tablet  Daily     11/11/18 1610    ondansetron (ZOFRAN-ODT) 4 MG disintegrating tablet  Every 8 hours PRN     11/11/18 1610    methocarbamol (ROBAXIN) 500 MG tablet  4 times daily     11/11/18 1610              This chart was dictated using voice recognition software/Dragon. Despite best efforts to proofread, errors can occur which can change the meaning. Any change was purely unintentional.    Racheal Patches, PA-C 11/11/18 1610    Minna Antis, MD 11/11/18 2336

## 2018-11-11 NOTE — ED Triage Notes (Signed)
Pt reports has a hx of sciatica and pulled a muscle a week ago and her back is still hurting on the right side.

## 2018-11-11 NOTE — ED Triage Notes (Signed)
Pt reports she bent over to get her dog off the chain when she pulled her back. Pt states hx of problems with her back as well.

## 2018-11-11 NOTE — ED Notes (Signed)
See triage note  Presents with lower back pain   States she thinks she pulled a muscle in back  Was seen by PCP but states pain is not any better

## 2018-11-16 NOTE — Progress Notes (Unsigned)
This encounter was created in error - please disregard.

## 2019-03-09 ENCOUNTER — Emergency Department: Payer: Medicaid Other

## 2019-03-09 ENCOUNTER — Encounter: Payer: Self-pay | Admitting: Emergency Medicine

## 2019-03-09 ENCOUNTER — Other Ambulatory Visit: Payer: Self-pay

## 2019-03-09 ENCOUNTER — Emergency Department
Admission: EM | Admit: 2019-03-09 | Discharge: 2019-03-09 | Disposition: A | Discharge status: Home / Self Care | Payer: Medicaid Other | Attending: Emergency Medicine | Admitting: Emergency Medicine

## 2019-03-09 DIAGNOSIS — M5441 Lumbago with sciatica, right side: Secondary | ICD-10-CM | POA: Diagnosis not present

## 2019-03-09 DIAGNOSIS — Y92410 Unspecified street and highway as the place of occurrence of the external cause: Secondary | ICD-10-CM | POA: Insufficient documentation

## 2019-03-09 DIAGNOSIS — Z79899 Other long term (current) drug therapy: Secondary | ICD-10-CM | POA: Diagnosis not present

## 2019-03-09 DIAGNOSIS — Y939 Activity, unspecified: Secondary | ICD-10-CM | POA: Insufficient documentation

## 2019-03-09 DIAGNOSIS — F17210 Nicotine dependence, cigarettes, uncomplicated: Secondary | ICD-10-CM | POA: Diagnosis not present

## 2019-03-09 DIAGNOSIS — M545 Low back pain: Secondary | ICD-10-CM | POA: Diagnosis present

## 2019-03-09 MED ORDER — MELOXICAM 15 MG PO TABS: 15.0000 mg | ORAL_TABLET | Freq: Every day | ORAL | 0 refills | Status: DC

## 2019-03-09 MED ORDER — ONDANSETRON 4 MG PO TBDP: 4.0000 mg | ORAL_TABLET | Freq: Three times a day (TID) | ORAL | 0 refills | Status: DC | PRN

## 2019-03-09 MED ORDER — METHOCARBAMOL 500 MG PO TABS: 500.0000 mg | ORAL_TABLET | Freq: Four times a day (QID) | ORAL | 0 refills | Status: DC

## 2019-03-09 MED ORDER — OXYCODONE-ACETAMINOPHEN 5-325 MG PO TABS
1.0000 | ORAL_TABLET | Freq: Once | ORAL | Status: AC
  Administered 2019-03-09: 1 via ORAL
  Filled 2019-03-09: qty 1
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
## 2019-03-09 NOTE — ED Notes (Signed)
Patient given warm blanket and tv turned on

## 2019-03-10 ENCOUNTER — Emergency Department
Admission: EM | Admit: 2019-03-10 | Discharge: 2019-03-10 | Disposition: A | Discharge status: Home / Self Care | Payer: Medicaid Other | Attending: Emergency Medicine | Admitting: Emergency Medicine

## 2019-03-10 ENCOUNTER — Other Ambulatory Visit: Payer: Self-pay

## 2019-03-10 DIAGNOSIS — F17210 Nicotine dependence, cigarettes, uncomplicated: Secondary | ICD-10-CM | POA: Diagnosis not present

## 2019-03-10 DIAGNOSIS — R569 Unspecified convulsions: Secondary | ICD-10-CM

## 2019-03-10 DIAGNOSIS — Z79899 Other long term (current) drug therapy: Secondary | ICD-10-CM | POA: Diagnosis not present

## 2019-03-10 LAB — COMPREHENSIVE METABOLIC PANEL
ALT: 6 U/L (ref 0–44)
AST: 19 U/L (ref 15–41)
Albumin: 4.1 g/dL (ref 3.5–5.0)
Alkaline Phosphatase: 71 U/L (ref 38–126)
Anion gap: 10 (ref 5–15)
BUN: 13 mg/dL (ref 6–20)
CO2: 24 mmol/L (ref 22–32)
Calcium: 8.8 mg/dL — ABNORMAL LOW (ref 8.9–10.3)
Chloride: 103 mmol/L (ref 98–111)
Creatinine, Ser: 1.47 mg/dL — ABNORMAL HIGH (ref 0.44–1.00)
GFR calc Af Amer: 45 mL/min — ABNORMAL LOW (ref >60)
GFR calc non Af Amer: 39 mL/min — ABNORMAL LOW (ref >60)
Glucose, Bld: 120 mg/dL — ABNORMAL HIGH (ref 70–99)
Potassium: 3.3 mmol/L — ABNORMAL LOW (ref 3.5–5.1)
Sodium: 137 mmol/L (ref 135–145)
Total Bilirubin: 0.7 mg/dL (ref 0.3–1.2)
Total Protein: 7.2 g/dL (ref 6.5–8.1)

## 2019-03-10 LAB — CBC
HCT: 46.9 % — ABNORMAL HIGH (ref 36.0–46.0)
Hemoglobin: 15.3 g/dL — ABNORMAL HIGH (ref 12.0–15.0)
MCH: 30.9 pg (ref 26.0–34.0)
MCHC: 32.6 g/dL (ref 30.0–36.0)
MCV: 94.7 fL (ref 80.0–100.0)
Platelets: 372 10*3/uL (ref 150–400)
RBC: 4.95 MIL/uL (ref 3.87–5.11)
RDW: 12.7 % (ref 11.5–15.5)
WBC: 16.8 10*3/uL — ABNORMAL HIGH (ref 4.0–10.5)
nRBC: 0 % (ref 0.0–0.2)

## 2019-03-10 MED ORDER — SODIUM CHLORIDE 0.9 % IV BOLUS
1000.0000 mL | Freq: Once | INTRAVENOUS | Status: AC
  Administered 2019-03-10: 1000 mL via INTRAVENOUS

## 2019-03-10 NOTE — ED Provider Notes (Signed)
[facility] Emergency Department Provider Note  Time seen: 5:02 PM  I have reviewed the triage vital signs and the nursing notes.   HISTORY  Chief Complaint Seizures    HPI Nicole Chandler is a 58 y.o. female with a past medical history of bipolar, seizure disorder, chronic back pain, depression, history of substance abuse, presents to the emergency department after a seizure.  According to the patient she has been outside today states she felt somewhat dehydrated and then had a witnessed seizure lasting approximately 1 minute.  Patient states she stopped taking her seizure medication several days ago, states she will begin taking it once again.  Past Medical History:  Diagnosis Date  . Arthritis   . Back pain   . Bipolar 1 disorder (HCC)   . Chronic back pain unk  . Depression   . Panic attacks   . Sciatica   . Seizures [facility](HCC)     Patient Active Problem List   Diagnosis Date Noted  . Influenza 10/21/2018  . Suicidal thoughts 09/01/2018  . Major depressive disorder, recurrent severe without psychotic features (HCC) 09/01/2018  . Major depressive disorder, single episode, severe without psychosis (HCC) 09/01/2018  . Postictal state (HCC) 11/13/2017  . 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 Seizure (HCC) 03/24/2016  . Substance induced mood disorder (HCC) 03/05/2016  . Opioid use disorder, severe, dependence (HCC) 02/25/2016  . Cocaine use disorder, moderate, dependence (HCC) 02/25/2016  . Tobacco use disorder 02/25/2016  . Opioid-induced mood disorder (HCC) 02/25/2016  . Cannabis use disorder, moderate, dependence (HCC) 02/25/2016    Past Surgical History:  Procedure Laterality Date  . ABDOMINAL SURGERY    . EYE SURGERY      Prior to Admission medications   Medication Sig Start Date End Date Taking? Authorizing Provider  baclofen (LIORESAL) 10 MG tablet Take 1 tablet (10 mg total) by mouth daily.  09/14/18 09/14/19  Enid DerryWagner, Ashley, PA-C  gabapentin (NEURONTIN) 300 MG capsule Take 1 capsule (300 mg total) by mouth 4 (four) times daily. 09/03/18   Pucilowska, Jolanta B, MD  lidocaine (LIDODERM) 5 % Place 1 patch onto the skin daily. Remove & Discard patch within 12 hours or as directed by MD 09/14/18   Enid DerryWagner, Ashley, PA-C  meloxicam (MOBIC) 15 MG tablet Take 1 tablet (15 mg total) by mouth daily. 11/11/18   Cuthriell, Delorise RoyalsJonathan D, PA-C  meloxicam (MOBIC) 15 MG tablet Take 1 tablet (15 mg total) by mouth daily. 03/09/19   Cuthriell, Delorise RoyalsJonathan D, PA-C  methocarbamol (ROBAXIN) 500 MG tablet Take 1 tablet (500 mg total) by mouth 4 (four) times daily. 11/11/18   Cuthriell, Delorise RoyalsJonathan D, PA-C  methocarbamol (ROBAXIN) 500 MG tablet Take 1 tablet (500 mg total) by mouth 4 (four) times daily. 03/09/19   Cuthriell, Delorise RoyalsJonathan D, PA-C  mirtazapine (REMERON) 15 MG tablet Take 1 tablet (15 mg total) by mouth at bedtime for 30 days. 09/03/18 10/03/18  Pucilowska, Ellin GoodieJolanta B, MD  ondansetron (ZOFRAN-ODT) 4 MG disintegrating tablet Take 1 tablet (4 mg total) by mouth every 8 (eight) hours as needed for nausea or vomiting. 11/11/18   Cuthriell, Delorise RoyalsJonathan D, PA-C  ondansetron (ZOFRAN-ODT) 4 MG disintegrating tablet Take 1 tablet (4 mg total) by mouth every 8 (eight) hours as needed for nausea or vomiting. 03/09/19   Cuthriell, Delorise RoyalsJonathan D, PA-C  phenytoin (DILANTIN) 100 MG ER capsule Take 1 capsule (100 mg total) by mouth 3 (three) times daily. 09/03/18 12/02/18  Pucilowska, Ellin GoodieJolanta B, MD    Allergies  Allergen Reactions  . Cyclobenzaprine     "makes the pt jumpy"  . Naproxen Sodium     Intestinal bloating that was so bad she had to go to the ER    Family History  Problem Relation Age of Onset  . Alzheimer's disease Mother   . AAA (abdominal aortic aneurysm) Father   . Breast cancer Sister [id number]  . Heart disease Brother     Social History Social History   Tobacco Use  . Smoking status: Current Every Day Smoker     Packs/day: 1.00    Types: Cigarettes    Last attempt to quit: 08/27/2014    Years since quitting: 4.5  . Smokeless tobacco: Never Used  Substance Use Topics  . Alcohol use: No  . Drug use: Not Currently    Types: Cocaine, Marijuana    Review of Systems Constitutional: Negative for fever. Cardiovascular: Negative for chest pain. Respiratory: Negative for shortness of breath. Gastrointestinal: Negative for abdominal pain, vomiting Musculoskeletal: Negative for musculoskeletal complaints Skin: Negative for skin complaints  Neurological: Negative for headache All other ROS negative  ____________________________________________   PHYSICAL EXAM:  VITAL SIGNS: ED Triage Vitals  Enc Vitals Group     BP 03/10/19 1650 112/74     Pulse Rate 03/10/19 1650 65     Resp 03/10/19 1650 18     Temp 03/10/19 1650 97.8 F (36.6 C)     Temp Source 03/10/19 1650 Oral     SpO2 03/10/19 1650 96 %     Weight 03/10/19 1651 140 lb (63.5 kg)     Height 03/10/19 1651 5\' 3"  (1.6 m)     Head Circumference --      Peak Flow --      Pain Score 03/10/19 1650 7     Pain Loc --      Pain Edu? --      Excl. in GC? --    Constitutional: Alert and oriented. Well appearing and in no distress. Eyes: Normal exam ENT      Head: Normocephalic and atraumatic.      Mouth/Throat: Mucous membranes are moist. Cardiovascular: Normal rate, regular rhythm. No murmur Respiratory: Normal respiratory effort without tachypnea nor retractions. Breath sounds are clear Gastrointestinal: Soft and nontender. No distention. Musculoskeletal: Nontender with normal range of motion in all extremities. Neurologic:  Normal speech and language. No gross focal neurologic deficits  Skin:  Skin is warm, dry and intact.  Psychiatric: Mood and affect are normal.   ____________________________________________  EKG viewed and interpreted by myself shows a normal sinus rhythm at 62 bpm with a narrow QRS, normal axis, normal intervals,  no concerning ST changes.  INITIAL IMPRESSION / ASSESSMENT AND PLAN / ED COURSE  Pertinent labs & imaging results that were available during my care of the patient were reviewed by me and considered in my medical decision making (see chart for details).   Patient presents to the emergency department for a witnessed seizure.  Has a history of seizure disorder.  States she stopped taking her seizure medication approximately 3 days ago because she thought it was giving her headache.  Currently the patient appears well, she is alert and oriented x4, no acute distress.  We will check basic labs, IV hydrate.  Patient is requesting something to eat and drink.  We will feed in the emergency department as long as her labs look well I will plan on discharge.  I discussed with the patient the need to take her medications as prescribed.  Patient agrees.  Labs are nonrevealing, mild leukocytosis likely due to seizure.  Patient appears well in the emergency department.  We will discharge home with PCP follow-up.  Patient is to restart her medications.  Nicole Chandler was evaluated in Emergency Department on 03/10/2019 for the symptoms described in the history of present illness. She was evaluated in the context of the global COVID-19 pandemic, which necessitated consideration that the patient might be at risk for infection with the SARS-CoV-2 virus that causes COVID-19. Institutional protocols and algorithms that pertain to the evaluation of patients at risk for COVID-19 are in a state of rapid change based on information released by regulatory bodies including the CDC and federal and state organizations. These policies and algorithms were followed during the patient's care in the ED.  ____________________________________________   FINAL CLINICAL IMPRESSION(S) / ED DIAGNOSES  Seizure    Harvest Dark, MD 03/10/19 1820
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
## 2019-05-11 ENCOUNTER — Emergency Department
Admission: RE | Admit: 2019-05-11 | Discharge: 2019-05-11 | Disposition: A | Discharge status: Home / Self Care | Payer: Medicaid Other | Attending: Emergency Medicine | Admitting: Emergency Medicine

## 2019-05-11 ENCOUNTER — Other Ambulatory Visit: Payer: Self-pay

## 2019-05-11 DIAGNOSIS — K649 Unspecified hemorrhoids: Secondary | ICD-10-CM | POA: Insufficient documentation

## 2019-05-11 DIAGNOSIS — Z79899 Other long term (current) drug therapy: Secondary | ICD-10-CM | POA: Diagnosis not present

## 2019-05-11 DIAGNOSIS — K625 Hemorrhage of anus and rectum: Secondary | ICD-10-CM | POA: Diagnosis not present

## 2019-05-11 DIAGNOSIS — F17210 Nicotine dependence, cigarettes, uncomplicated: Secondary | ICD-10-CM | POA: Insufficient documentation

## 2019-05-11 DIAGNOSIS — R197 Diarrhea, unspecified: Secondary | ICD-10-CM | POA: Diagnosis present

## 2019-05-11 LAB — URINALYSIS, COMPLETE (UACMP) WITH MICROSCOPIC
Bacteria, UA: NONE SEEN
Bilirubin Urine: NEGATIVE
Glucose, UA: NEGATIVE mg/dL
Ketones, ur: NEGATIVE mg/dL
Leukocytes,Ua: NEGATIVE
Nitrite: NEGATIVE
Protein, ur: NEGATIVE mg/dL
Specific Gravity, Urine: 1.002 — ABNORMAL LOW (ref 1.005–1.030)
Squamous Epithelial / HPF: NONE SEEN (ref 0–5)
pH: 7 (ref 5.0–8.0)

## 2019-05-11 LAB — CBC
HCT: 42.7 % (ref 36.0–46.0)
Hemoglobin: 13.9 g/dL (ref 12.0–15.0)
MCH: 30.9 pg (ref 26.0–34.0)
MCHC: 32.6 g/dL (ref 30.0–36.0)
MCV: 94.9 fL (ref 80.0–100.0)
Platelets: 363 10*3/uL (ref 150–400)
RBC: 4.5 MIL/uL (ref 3.87–5.11)
RDW: 13.2 % (ref 11.5–15.5)
WBC: 9 10*3/uL (ref 4.0–10.5)
nRBC: 0 % (ref 0.0–0.2)

## 2019-05-11 LAB — COMPREHENSIVE METABOLIC PANEL
ALT: 7 U/L (ref 0–44)
AST: 21 U/L (ref 15–41)
Albumin: 4.3 g/dL (ref 3.5–5.0)
Alkaline Phosphatase: 91 U/L (ref 38–126)
Anion gap: 12 (ref 5–15)
BUN: 5 mg/dL — ABNORMAL LOW (ref 6–20)
CO2: 23 mmol/L (ref 22–32)
Calcium: 9.1 mg/dL (ref 8.9–10.3)
Chloride: 104 mmol/L (ref 98–111)
Creatinine, Ser: 0.97 mg/dL (ref 0.44–1.00)
GFR calc Af Amer: >60 mL/min (ref >60)
GFR calc non Af Amer: >60 mL/min (ref >60)
Glucose, Bld: 128 mg/dL — ABNORMAL HIGH (ref 70–99)
Potassium: 3.6 mmol/L (ref 3.5–5.1)
Sodium: 139 mmol/L (ref 135–145)
Total Bilirubin: 0.4 mg/dL (ref 0.3–1.2)
Total Protein: 7.1 g/dL (ref 6.5–8.1)

## 2019-05-11 LAB — LIPASE, BLOOD: Lipase: 67 U/L — ABNORMAL HIGH (ref 11–51)

## 2019-05-11 MED ORDER — HYDROCORTISONE ACETATE 25 MG RE SUPP: 25.0000 mg | Freq: Two times a day (BID) | RECTAL | 1 refills | Status: AC

## 2019-05-11 NOTE — ED Provider Notes (Signed)
[facility] Emergency Department Provider Note   ____________________________________________   First MD Initiated Contact with Patient 05/11/19 1809     (approximate)  I have reviewed the triage vital signs and the nursing notes.   HISTORY  Chief Complaint Diarrhea, Rectal Bleeding, and Abdominal Cramping    HPI Nicole Chandler is a 58 y.o. female with past medical history of seizures and bipolar disorder presents to the ED complaining of diarrhea and rectal bleeding.  Patient reports she has had approximately 2 days of watery diarrhea and since this morning has been noting seeing streaks of bright red blood in her stool.  This been associated with some rectal discomfort and some abdominal cramping, but she denies any focal abdominal pain.  She denies any nausea or vomiting, has not had any fevers or recent sick contacts.  She denies any cough, chest pain, or shortness of breath.  She denies any history of hemorrhoids, also denies any history of alcohol abuse or melena.        Past Medical History:  Diagnosis Date  . Arthritis   . Back pain   . Bipolar 1 disorder (HCC)   . Chronic back pain unk  . Depression   . Panic attacks   . Sciatica   . Seizures [facility])     Patient Active Problem List   Diagnosis Date Noted  . Influenza 10/21/2018  . Suicidal thoughts 09/01/2018  . Major depressive disorder, recurrent severe without psychotic features (HCC) 09/01/2018  . Major depressive disorder, single episode, severe without psychosis (HCC) 09/01/2018  . Postictal state (HCC) 11/13/2017  . 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 Seizure (HCC) 03/24/2016  . Substance induced mood disorder (HCC) 03/05/2016  . Opioid use disorder, severe, dependence (HCC) 02/25/2016  . Cocaine use disorder, moderate, dependence (HCC) 02/25/2016  . Tobacco use disorder 02/25/2016  . Opioid-induced mood disorder (HCC) 02/25/2016   . Cannabis use disorder, moderate, dependence (HCC) 02/25/2016    Past Surgical History:  Procedure Laterality Date  . ABDOMINAL SURGERY    . EYE SURGERY      Prior to Admission medications   Medication Sig Start Date End Date Taking? Authorizing Provider  baclofen (LIORESAL) 10 MG tablet Take 1 tablet (10 mg total) by mouth daily. 09/14/18 09/14/19  Enid Derry, PA-C  gabapentin (NEURONTIN) 300 MG capsule Take 1 capsule (300 mg total) by mouth 4 (four) times daily. 09/03/18   Pucilowska, Ellin Goodie, MD  hydrocortisone (ANUSOL-HC) 25 MG suppository Place 1 suppository (25 mg total) rectally every 12 (twelve) hours for 12 days. 05/11/19 05/23/19  Chesley Noon, MD  lidocaine (LIDODERM) 5 % Place 1 patch onto the skin daily. Remove & Discard patch within 12 hours or as directed by MD 09/14/18   Enid Derry, PA-C  meloxicam (MOBIC) 15 MG tablet Take 1 tablet (15 mg total) by mouth daily. 11/11/18   Cuthriell, Delorise Royals, PA-C  meloxicam (MOBIC) 15 MG tablet Take 1 tablet (15 mg total) by mouth daily. 03/09/19   Cuthriell, Delorise Royals, PA-C  methocarbamol (ROBAXIN) 500 MG tablet Take 1 tablet (500 mg total) by mouth 4 (four) times daily. 11/11/18   Cuthriell, Delorise Royals, PA-C  methocarbamol (ROBAXIN) 500 MG tablet Take 1 tablet (500 mg total) by mouth 4 (four) times daily. 03/09/19   Cuthriell, Delorise Royals, PA-C  mirtazapine (REMERON) 15 MG tablet Take 1 tablet (15 mg total) by mouth at bedtime for 30 days. 09/03/18 10/03/18  Pucilowska, Jolanta B, MD  ondansetron (ZOFRAN-ODT) 4 MG disintegrating tablet Take 1 tablet (4 mg total) by mouth every 8 (eight) hours as needed for nausea or vomiting. 11/11/18   Cuthriell, Delorise RoyalsJonathan D, PA-C  ondansetron (ZOFRAN-ODT) 4 MG disintegrating tablet Take 1 tablet (4 mg total) by mouth every 8 (eight) hours as needed for nausea or vomiting. 03/09/19   Cuthriell, Delorise RoyalsJonathan D, PA-C  phenytoin (DILANTIN) 100 MG ER capsule Take 1 capsule (100 mg total) by mouth 3 (three) times  daily. 09/03/18 12/02/18  Pucilowska, Ellin GoodieJolanta B, MD    Allergies Cyclobenzaprine and Naproxen sodium  Family History  Problem Relation Age of Onset  . Alzheimer's disease Mother   . AAA (abdominal aortic aneurysm) Father   . Breast cancer Sister 1755  . Heart disease Brother     Social History Social History   Tobacco Use  . Smoking status: Current Every Day Smoker    Packs/day: 1.00    Types: Cigarettes    Last attempt to quit: 08/27/2014    Years since quitting: 4.7  . Smokeless tobacco: Never Used  Substance Use Topics  . Alcohol use: No  . Drug use: Not Currently    Types: Cocaine, Marijuana    Review of Systems  Constitutional: No fever/chills Eyes: No visual changes. ENT: No sore throat. Cardiovascular: Denies chest pain. Respiratory: Denies shortness of breath. Gastrointestinal: No abdominal pain.  No nausea, no vomiting.  Positive for diarrhea and rectal bleeding.  No constipation. Genitourinary: Negative for dysuria. Musculoskeletal: Negative for back pain. Skin: Negative for rash. Neurological: Negative for headaches, focal weakness or numbness.  ____________________________________________   PHYSICAL EXAM:  VITAL SIGNS: ED Triage Vitals  Enc Vitals Group     BP 05/11/19 1639 (!) 163/98     Pulse Rate 05/11/19 1639 67     Resp 05/11/19 1639 16     Temp 05/11/19 1639 98.5 F (36.9 C)     Temp Source 05/11/19 1639 Oral     SpO2 05/11/19 1639 100 %     Weight 05/11/19 1640 130 lb (59 kg)     Height 05/11/19 1640 5\' 2"  (1.575 m)     Head Circumference --      Peak Flow --      Pain Score 05/11/19 1640 6     Pain Loc --      Pain Edu? --      Excl. in GC? --     Constitutional: Alert and oriented. Eyes: Conjunctivae are normal. Head: Atraumatic. Nose: No congestion/rhinnorhea. Mouth/Throat: Mucous membranes are moist. Neck: Normal ROM Cardiovascular: Normal rate, regular rhythm. Grossly normal heart sounds. Respiratory: Normal respiratory  effort.  No retractions. Lungs CTAB. Gastrointestinal: Soft and nontender. No distention.  Hemorrhoids noted on rectal exam with small amount of bright red blood noted with digital rectal exam. Genitourinary: deferred Musculoskeletal: No lower extremity tenderness nor edema. Neurologic:  Normal speech and language. No gross focal neurologic deficits are appreciated. Skin:  Skin is warm, dry and intact. No rash noted. Psychiatric: Mood and affect are normal. Speech and behavior are normal.  ____________________________________________   LABS (all labs ordered are listed, but only abnormal results are displayed)  Labs Reviewed  COMPREHENSIVE METABOLIC PANEL - Abnormal; Notable for the following components:      Result Value   Glucose, Bld 128 (*)    BUN 5 (*)    All other components within normal limits  URINALYSIS, COMPLETE (UACMP) WITH MICROSCOPIC - Abnormal; Notable for the following  components:   Color, Urine COLORLESS (*)    APPearance CLEAR (*)    Specific Gravity, Urine 1.002 (*)    Hgb urine dipstick SMALL (*)    All other components within normal limits  LIPASE, BLOOD - Abnormal; Notable for the following components:   Lipase 67 (*)    All other components within normal limits  CBC  TYPE AND SCREEN     PROCEDURES  Procedure(s) performed (including Critical Care):  Procedures   ____________________________________________   INITIAL IMPRESSION / ASSESSMENT AND PLAN / ED COURSE       58 year old female presenting to the ED for diarrhea for diarrhea for the past 2 days, noticed rectal bleeding since this morning.  She has a benign and nonfocal abdominal exam, doubt surgical intra-abdominal pathology.  Rectal exam does appear consistent with hemorrhoids and associated bleeding.  Does not appear to be a significant amount of bleeding based on description, patient also with stable H&H.  Labs thus far are unremarkable, lipase and UA pending.  We will continue to monitor  for worsening bleeding for now.  UA without evidence of infection and lipase unremarkable.  Patient passed a very small amount of blood here in the toilet, no significant ongoing bleeding.  Will prescribe Anusol and have patient follow-up with PCP, patient agrees with plan.      ____________________________________________   FINAL CLINICAL IMPRESSION(S) / ED DIAGNOSES  Final diagnoses:  Rectal bleeding  Hemorrhoids, unspecified hemorrhoid type     ED Discharge Orders         Ordered    hydrocortisone (ANUSOL-HC) 25 MG suppository  Every 12 hours     05/11/19 1944           Note:  This document was prepared using Dragon voice recognition software and may include unintentional dictation errors.   Blake Divine, MD 05/11/19 2108

## 2019-05-11 NOTE — ED Triage Notes (Signed)
Left lower abdominal cramping, diarrhea and rectal bleeding. No hx of such. Denies NV.   Pt alert and oriented X4, cooperative, RR even and unlabored, color WNL. Pt in NAD.

## 2019-05-13 LAB — TYPE AND SCREEN
ABO/RH(D): O POS
Antibody Screen: NEGATIVE

## 2020-03-02 ENCOUNTER — Encounter: Payer: Medicaid Other | Admitting: Oncology

## 2020-03-02 MED ORDER — NICOTINE 21 MG/24HR TD PT24: 21.0000 mg | MEDICATED_PATCH | Freq: Every day | TRANSDERMAL | 0 refills | Status: DC

## 2020-03-02 NOTE — Progress Notes (Signed)
This encounter was created in error - please disregard.

## 2020-03-16 ENCOUNTER — Inpatient Hospital Stay: Payer: Medicaid Other | Admitting: Oncology

## 2020-03-19 ENCOUNTER — Other Ambulatory Visit: Payer: Self-pay

## 2020-03-19 ENCOUNTER — Inpatient Hospital Stay: Payer: Medicaid Other | Attending: Oncology | Admitting: Oncology

## 2020-09-03 ENCOUNTER — Telehealth: Payer: Self-pay | Admitting: Pharmacy Technician

## 2020-09-03 NOTE — Telephone Encounter (Signed)
Patient failed to provide 2021 proof of income.  No additional medication assistance will be provided by [facility] without the required proof of income documentation.  Patient notified by letter.  [name] J. Ehren Berisha Care Manager [facility]   [address] [place], [place]  [address]     This is to inform you that you are no longer eligible to receive medication assistance at [facility].  The reason(s) are:    _____Your total gross monthly household income exceeds 250% of the Federal Poverty Level.   _____Tangible assets (savings, checking, stocks/bonds, pension, retirement, etc.) exceeds our limit  _____You are eligible to receive benefits from Medicaid, [facility] or HIV Medication              Assistance Program _____You are eligible to receive benefits from a Medicare Part "D" plan _____You have prescription insurance  _____You are not an [place] resident __X__Failure to provide all requested proof of income information for 2021.    Medication assistance will resume once all requested financial information has been returned to our clinic.  If you have questions, please contact our clinic at [phone].    Thank you,  [facility] 

## 2020-10-07 IMAGING — CR DG CHEST 2V
2 series · 2 of 2 positions shown · non-contrast
Comparison: None

CLINICAL DATA: Soft tissue mass at LEFT posterior axillary line

EXAM:
CHEST - 2 VIEW

[chest pa]
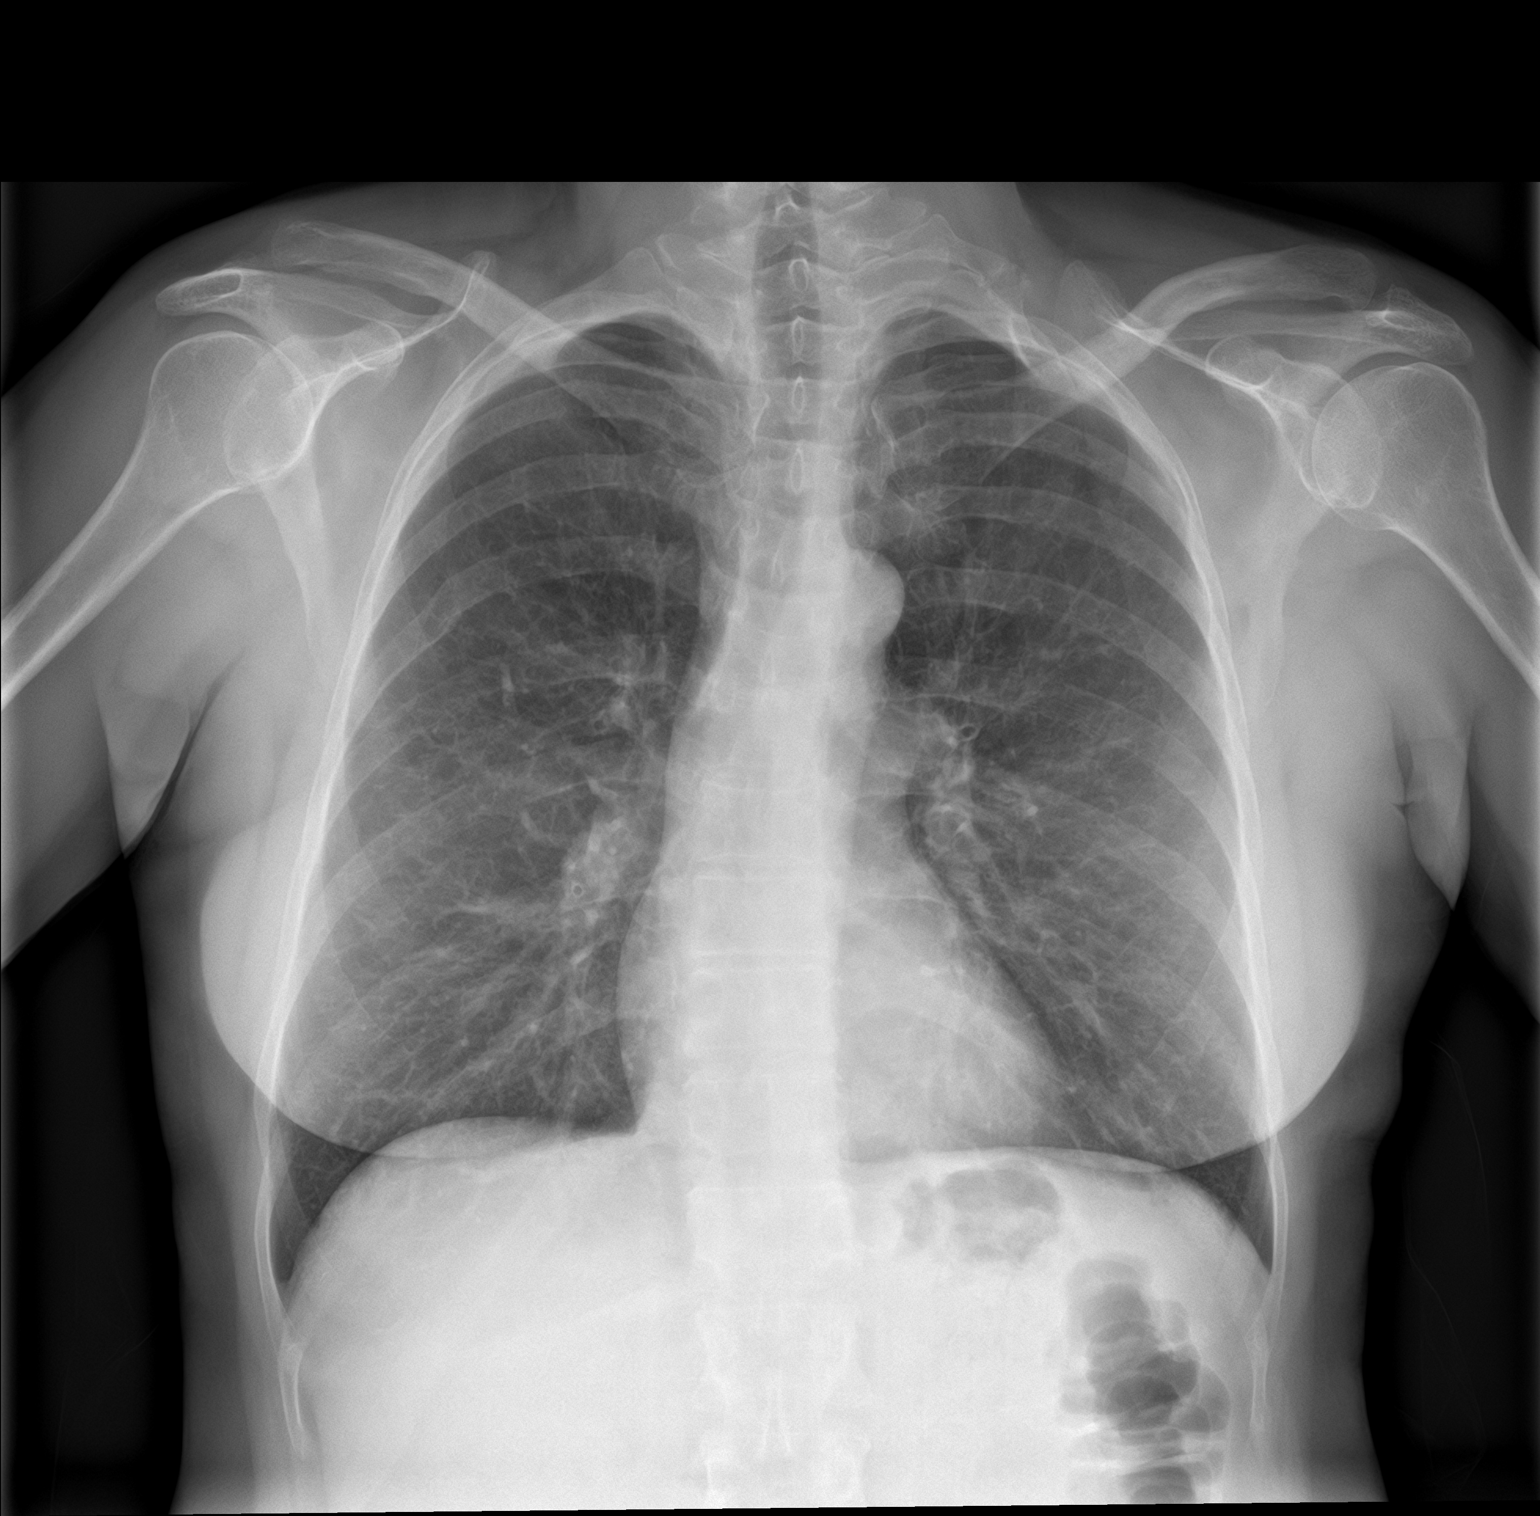

[chest lat]
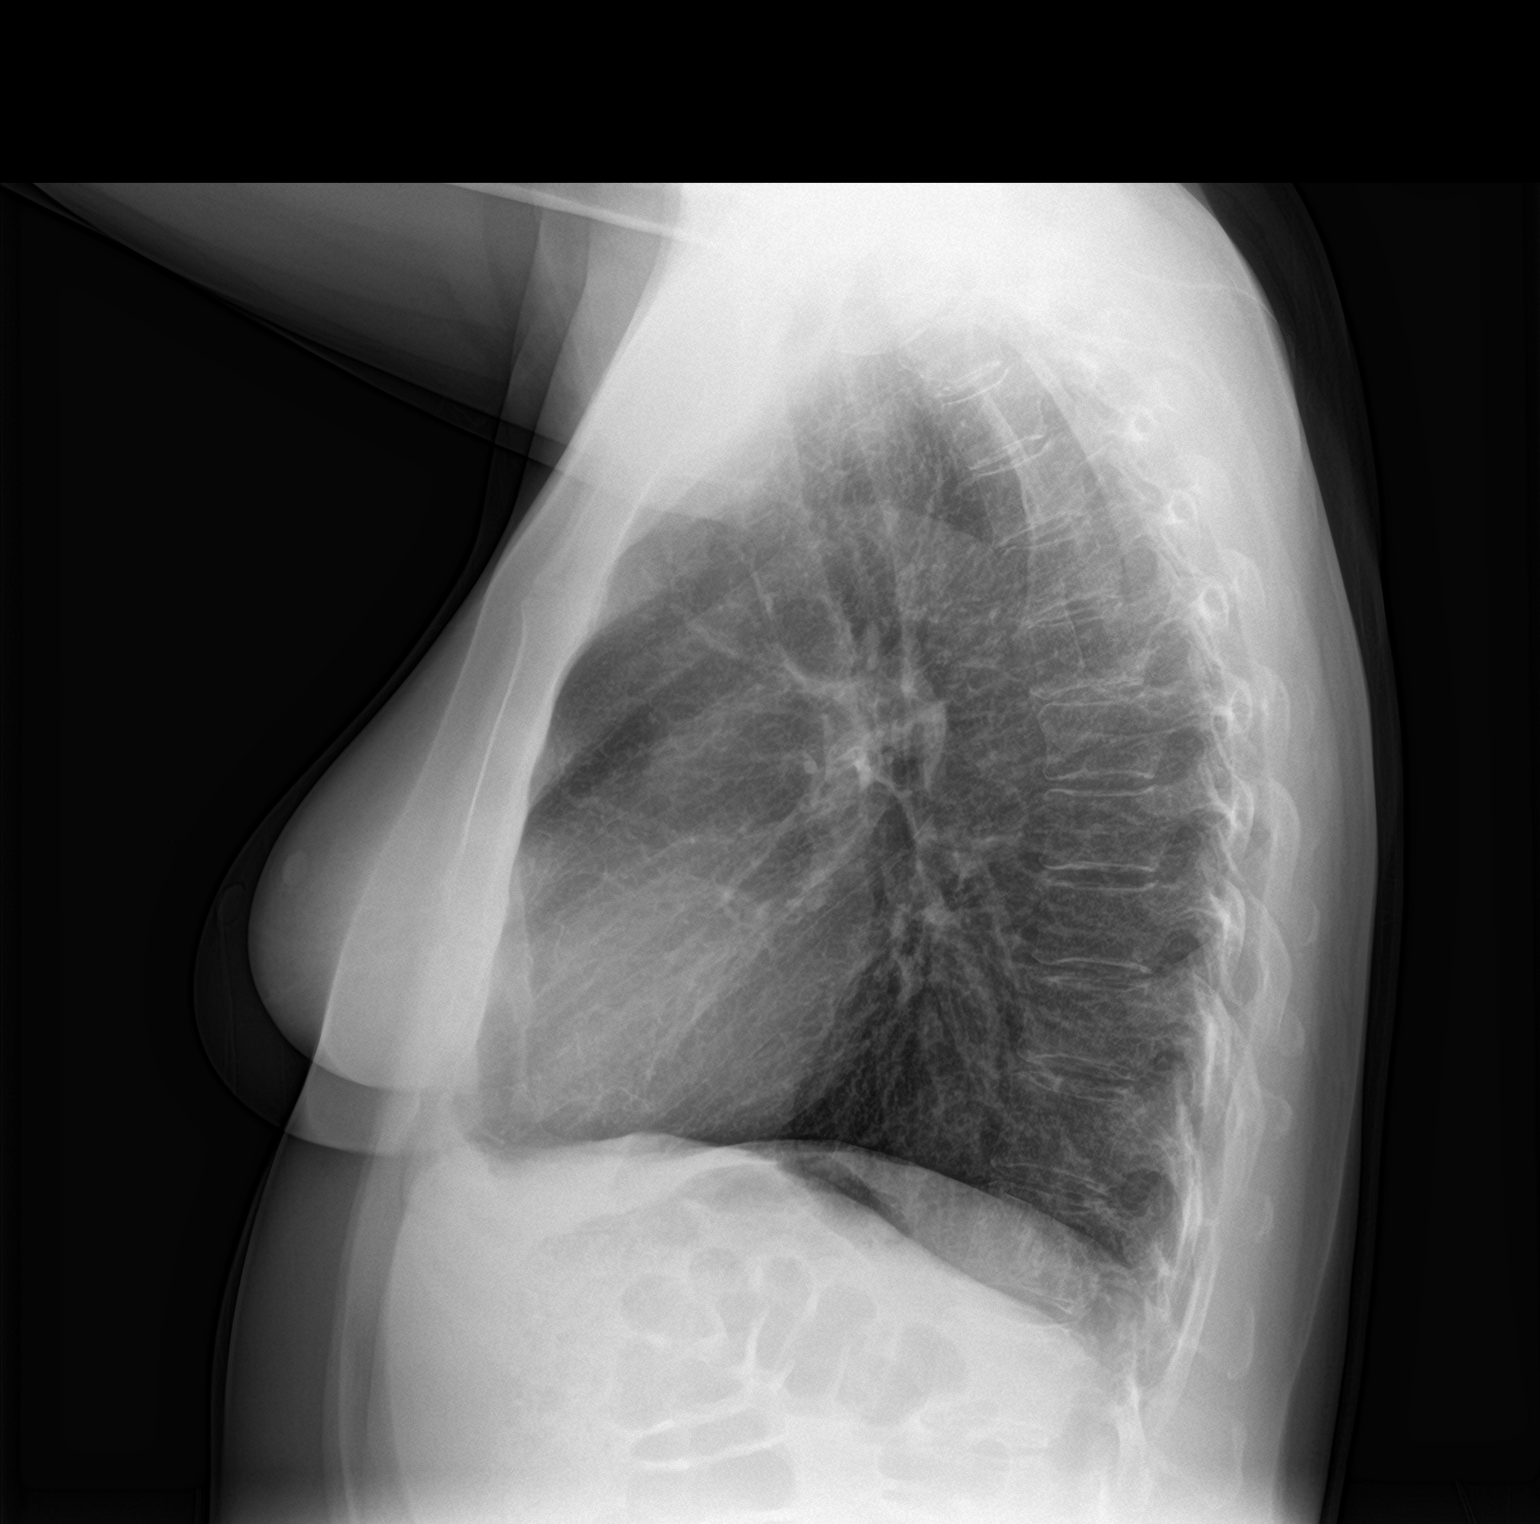

[2 of 2 positions shown; findings below may reference images not displayed]

FINDINGS: Normal heart size, mediastinal contours, and pulmonary vascularity.

Emphysematous and bronchitic changes consistent with COPD.

No infiltrate, pleural effusion or pneumothorax.

Old healed fracture of the posterior RIGHT sixth rib.

Bones appear demineralized.

LEFT cervical rib with pseudoarthrosis.

No acute osseous findings.
IMPRESSION: COPD changes.

At no acute abnormalities.

## 2020-10-21 IMAGING — CR DG SHOULDER 2+V*L*
3 series · 3 of 3 positions shown · non-contrast
Comparison: None.

CLINICAL DATA: Severe neck pain.

EXAM:
LEFT SHOULDER - 2+ VIEW

[shoulder grashey]
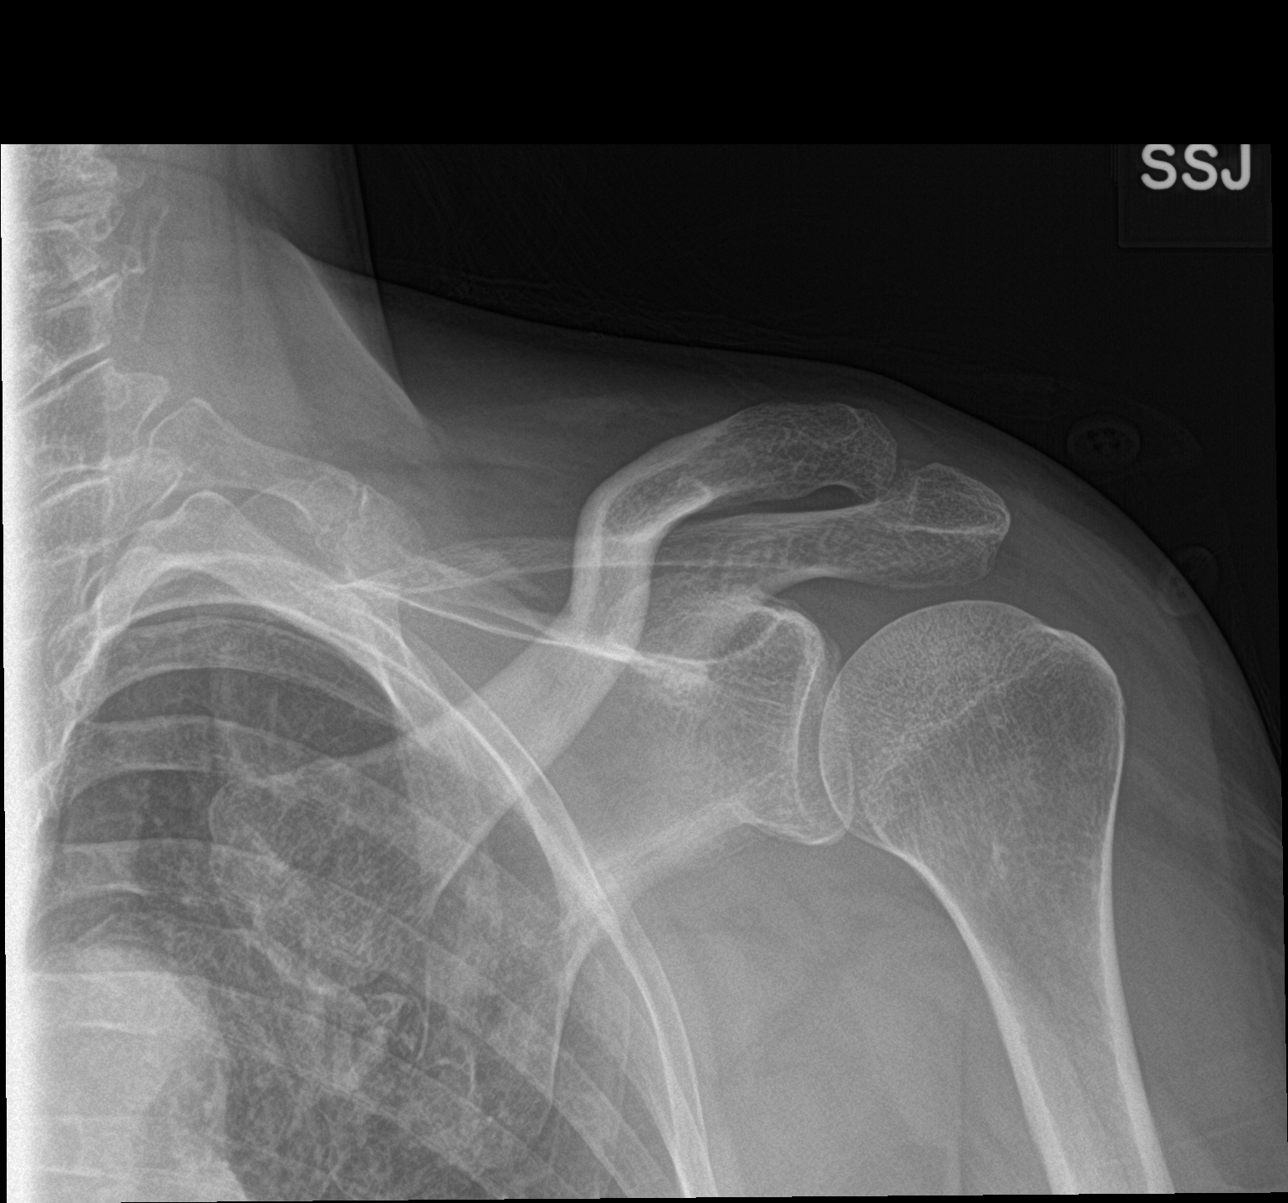

[shoulder y view]
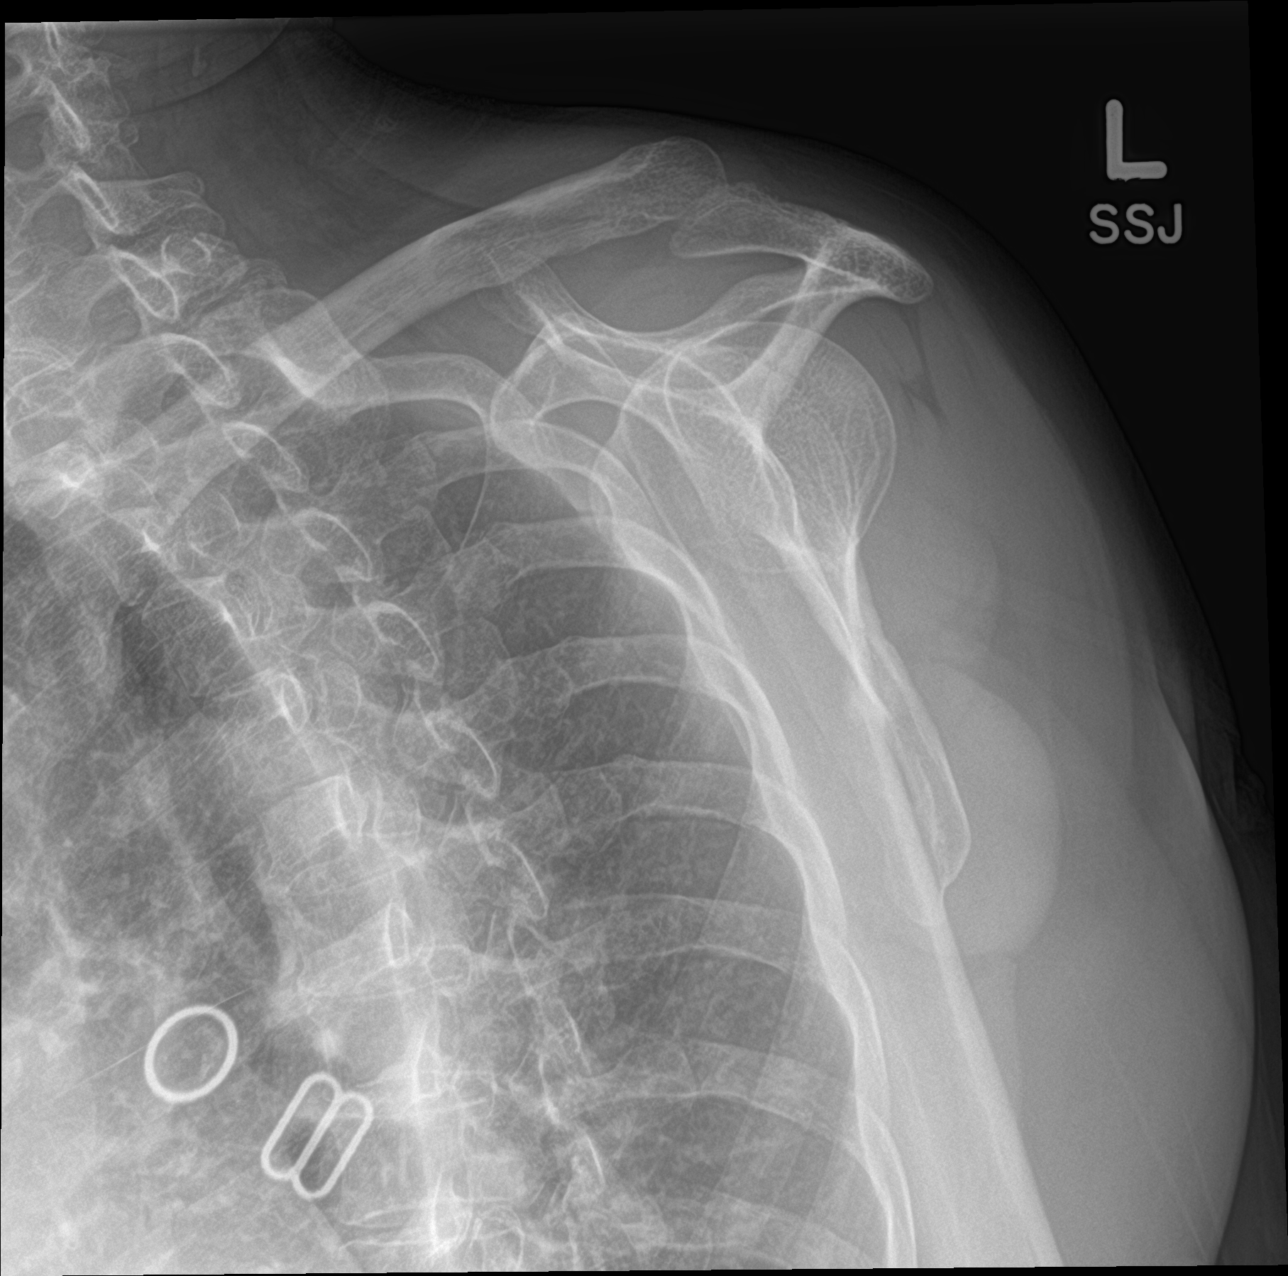

[shoulder axillary]
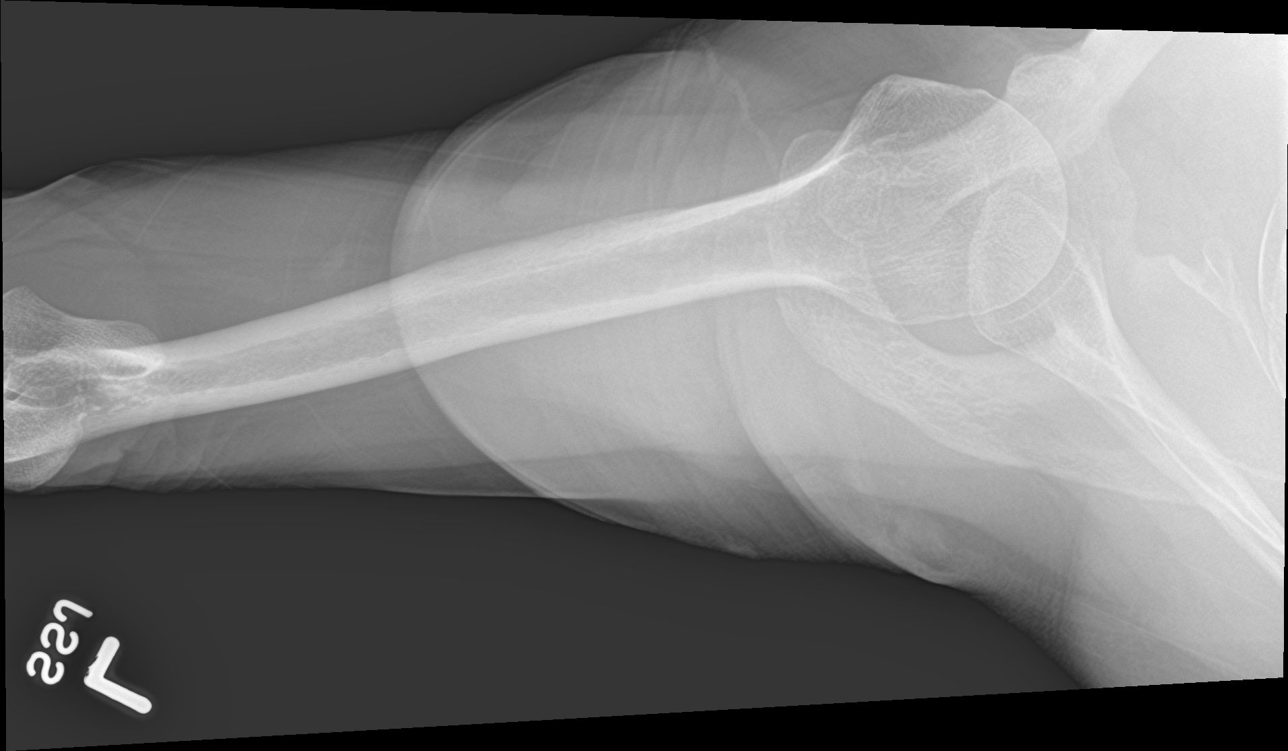

[3 of 3 positions shown; findings below may reference images not displayed]

FINDINGS: There is no evidence of fracture or dislocation. There is no
evidence of arthropathy or other focal bone abnormality. Soft
tissues are unremarkable.
IMPRESSION: Negative.

## 2022-02-24 ENCOUNTER — Observation Stay: Payer: 59

## 2022-02-24 ENCOUNTER — Emergency Department: Payer: 59

## 2022-02-24 ENCOUNTER — Other Ambulatory Visit: Payer: Self-pay

## 2022-02-24 ENCOUNTER — Observation Stay
Admission: EM | Admit: 2022-02-24 | Discharge: 2022-02-26 | Disposition: A | Discharge status: Home / Self Care | Payer: 59 | Attending: Internal Medicine | Admitting: Internal Medicine

## 2022-02-24 DIAGNOSIS — F17210 Nicotine dependence, cigarettes, uncomplicated: Secondary | ICD-10-CM | POA: Diagnosis present

## 2022-02-24 DIAGNOSIS — R531 Weakness: Principal | ICD-10-CM | POA: Insufficient documentation

## 2022-02-24 DIAGNOSIS — J449 Chronic obstructive pulmonary disease, unspecified: Secondary | ICD-10-CM | POA: Diagnosis present

## 2022-02-24 DIAGNOSIS — Z888 Allergy status to other drugs, medicaments and biological substances status: Secondary | ICD-10-CM

## 2022-02-24 DIAGNOSIS — F4310 Post-traumatic stress disorder, unspecified: Secondary | ICD-10-CM | POA: Diagnosis present

## 2022-02-24 DIAGNOSIS — F1910 Other psychoactive substance abuse, uncomplicated: Secondary | ICD-10-CM

## 2022-02-24 DIAGNOSIS — E860 Dehydration: Secondary | ICD-10-CM | POA: Insufficient documentation

## 2022-02-24 DIAGNOSIS — R7889 Finding of other specified substances, not normally found in blood: Secondary | ICD-10-CM

## 2022-02-24 DIAGNOSIS — Z91148 Patient's other noncompliance with medication regimen for other reason: Secondary | ICD-10-CM

## 2022-02-24 DIAGNOSIS — F1110 Opioid abuse, uncomplicated: Secondary | ICD-10-CM | POA: Diagnosis present

## 2022-02-24 DIAGNOSIS — G40802 Other epilepsy, not intractable, without status epilepticus: Secondary | ICD-10-CM | POA: Diagnosis present

## 2022-02-24 DIAGNOSIS — R569 Unspecified convulsions: Secondary | ICD-10-CM | POA: Diagnosis not present

## 2022-02-24 DIAGNOSIS — F1410 Cocaine abuse, uncomplicated: Secondary | ICD-10-CM | POA: Diagnosis present

## 2022-02-24 DIAGNOSIS — T420X6A Underdosing of hydantoin derivatives, initial encounter: Secondary | ICD-10-CM | POA: Diagnosis present

## 2022-02-24 DIAGNOSIS — Z716 Tobacco abuse counseling: Secondary | ICD-10-CM

## 2022-02-24 DIAGNOSIS — F410 Panic disorder [episodic paroxysmal anxiety] without agoraphobia: Secondary | ICD-10-CM | POA: Diagnosis present

## 2022-02-24 DIAGNOSIS — R404 Transient alteration of awareness: Secondary | ICD-10-CM | POA: Diagnosis not present

## 2022-02-24 DIAGNOSIS — E785 Hyperlipidemia, unspecified: Secondary | ICD-10-CM | POA: Diagnosis not present

## 2022-02-24 DIAGNOSIS — M1990 Unspecified osteoarthritis, unspecified site: Secondary | ICD-10-CM | POA: Diagnosis present

## 2022-02-24 DIAGNOSIS — R000 Tachycardia, unspecified: Secondary | ICD-10-CM | POA: Diagnosis not present

## 2022-02-24 DIAGNOSIS — R451 Restlessness and agitation: Secondary | ICD-10-CM | POA: Diagnosis not present

## 2022-02-24 DIAGNOSIS — F329 Major depressive disorder, single episode, unspecified: Secondary | ICD-10-CM

## 2022-02-24 DIAGNOSIS — R4182 Altered mental status, unspecified: Secondary | ICD-10-CM | POA: Diagnosis not present

## 2022-02-24 DIAGNOSIS — G40909 Epilepsy, unspecified, not intractable, without status epilepticus: Secondary | ICD-10-CM | POA: Diagnosis present

## 2022-02-24 DIAGNOSIS — F319 Bipolar disorder, unspecified: Secondary | ICD-10-CM | POA: Diagnosis present

## 2022-02-24 DIAGNOSIS — E871 Hypo-osmolality and hyponatremia: Secondary | ICD-10-CM | POA: Diagnosis present

## 2022-02-24 DIAGNOSIS — F411 Generalized anxiety disorder: Secondary | ICD-10-CM

## 2022-02-24 DIAGNOSIS — Z79899 Other long term (current) drug therapy: Secondary | ICD-10-CM

## 2022-02-24 DIAGNOSIS — Z91199 Patient's noncompliance with other medical treatment and regimen due to unspecified reason: Secondary | ICD-10-CM

## 2022-02-24 DIAGNOSIS — F1210 Cannabis abuse, uncomplicated: Secondary | ICD-10-CM | POA: Diagnosis present

## 2022-02-24 DIAGNOSIS — R519 Headache, unspecified: Secondary | ICD-10-CM | POA: Diagnosis not present

## 2022-02-24 DIAGNOSIS — J42 Unspecified chronic bronchitis: Secondary | ICD-10-CM | POA: Diagnosis not present

## 2022-02-24 DIAGNOSIS — G8929 Other chronic pain: Secondary | ICD-10-CM | POA: Diagnosis present

## 2022-02-24 LAB — BASIC METABOLIC PANEL
Anion gap: 8 (ref 5–15)
BUN: 14 mg/dL (ref 6–20)
CO2: 21 mmol/L — ABNORMAL LOW (ref 22–32)
Calcium: 9.1 mg/dL (ref 8.9–10.3)
Chloride: 105 mmol/L (ref 98–111)
Creatinine, Ser: 1.32 mg/dL — ABNORMAL HIGH (ref 0.44–1.00)
GFR, Estimated: 46 mL/min — ABNORMAL LOW (ref >60)
Glucose, Bld: 130 mg/dL — ABNORMAL HIGH (ref 70–99)
Potassium: 3.7 mmol/L (ref 3.5–5.1)
Sodium: 134 mmol/L — ABNORMAL LOW (ref 135–145)

## 2022-02-24 LAB — URINE DRUG SCREEN, QUALITATIVE (ARMC ONLY)
Amphetamines, Ur Screen: NOT DETECTED
Barbiturates, Ur Screen: NOT DETECTED
Benzodiazepine, Ur Scrn: NOT DETECTED
Cannabinoid 50 Ng, Ur ~~LOC~~: POSITIVE — AB
Cocaine Metabolite,Ur ~~LOC~~: NOT DETECTED
MDMA (Ecstasy)Ur Screen: NOT DETECTED
Methadone Scn, Ur: NOT DETECTED
Opiate, Ur Screen: NOT DETECTED
Phencyclidine (PCP) Ur S: NOT DETECTED
Tricyclic, Ur Screen: POSITIVE — AB

## 2022-02-24 LAB — CBC
HCT: 36.8 % (ref 36.0–46.0)
Hemoglobin: 11.8 g/dL — ABNORMAL LOW (ref 12.0–15.0)
MCH: 32.1 pg (ref 26.0–34.0)
MCHC: 32.1 g/dL (ref 30.0–36.0)
MCV: 100 fL (ref 80.0–100.0)
Platelets: 272 10*3/uL (ref 150–400)
RBC: 3.68 MIL/uL — ABNORMAL LOW (ref 3.87–5.11)
RDW: 12.8 % (ref 11.5–15.5)
WBC: 8.8 10*3/uL (ref 4.0–10.5)
nRBC: 0 % (ref 0.0–0.2)

## 2022-02-24 LAB — URINALYSIS, ROUTINE W REFLEX MICROSCOPIC
Bacteria, UA: NONE SEEN
Bilirubin Urine: NEGATIVE
Glucose, UA: NEGATIVE mg/dL
Ketones, ur: NEGATIVE mg/dL
Leukocytes,Ua: NEGATIVE
Nitrite: NEGATIVE
Protein, ur: NEGATIVE mg/dL
Specific Gravity, Urine: 1.008 (ref 1.005–1.030)
pH: 5 (ref 5.0–8.0)

## 2022-02-24 LAB — PHENYTOIN LEVEL, TOTAL: Phenytoin Lvl: <2.5 ug/mL — ABNORMAL LOW (ref 10.0–20.0)

## 2022-02-24 MED ORDER — SODIUM CHLORIDE 0.9 % IV SOLN
15.0000 mg/kg | Freq: Once | INTRAVENOUS | Status: AC
  Administered 2022-02-24: 885 mg via INTRAVENOUS
  Filled 2022-02-24: qty 17.7

## 2022-02-24 MED ORDER — LEVETIRACETAM 500 MG PO TABS
500.0000 mg | ORAL_TABLET | Freq: Two times a day (BID) | ORAL | Status: DC
  Administered 2022-02-24: 500 mg via ORAL
  Filled 2022-02-24 (×2): qty 1

## 2022-02-24 MED ORDER — LACTATED RINGERS IV BOLUS
500.0000 mL | Freq: Once | INTRAVENOUS | Status: AC
  Administered 2022-02-24: 500 mL via INTRAVENOUS

## 2022-02-24 MED ORDER — LORAZEPAM 2 MG/ML IJ SOLN
1.0000 mg | Freq: Once | INTRAMUSCULAR | Status: AC
  Administered 2022-02-24: 1 mg via INTRAVENOUS
  Filled 2022-02-24: qty 1

## 2022-02-24 MED ORDER — NICOTINE 21 MG/24HR TD PT24
21.0000 mg | MEDICATED_PATCH | Freq: Once | TRANSDERMAL | Status: DC
  Administered 2022-02-24: 21 mg via TRANSDERMAL
  Filled 2022-02-24: qty 1

## 2022-02-24 MED ORDER — ONDANSETRON HCL 4 MG PO TABS: 4.0000 mg | ORAL_TABLET | Freq: Four times a day (QID) | ORAL | Status: DC | PRN

## 2022-02-24 MED ORDER — ONDANSETRON HCL 4 MG/2ML IJ SOLN: 4.0000 mg | Freq: Four times a day (QID) | INTRAMUSCULAR | Status: DC | PRN

## 2022-02-24 MED ORDER — LORAZEPAM 2 MG/ML IJ SOLN
1.0000 mg | INTRAMUSCULAR | Status: DC | PRN
  Administered 2022-02-24: 1 mg via INTRAVENOUS
  Filled 2022-02-24: qty 1

## 2022-02-24 MED ORDER — SODIUM CHLORIDE 0.9 % IV SOLN: INTRAVENOUS | Status: DC

## 2022-02-24 MED ORDER — MIRTAZAPINE 15 MG PO TABS
15.0000 mg | ORAL_TABLET | Freq: Every day | ORAL | Status: DC
  Administered 2022-02-24 – 2022-02-25 (×2): 15 mg via ORAL
  Filled 2022-02-24 (×2): qty 1

## 2022-02-24 MED ORDER — ACETAMINOPHEN 650 MG RE SUPP: 650.0000 mg | Freq: Four times a day (QID) | RECTAL | Status: DC | PRN

## 2022-02-24 MED ORDER — ENOXAPARIN SODIUM 40 MG/0.4ML IJ SOSY
40.0000 mg | PREFILLED_SYRINGE | INTRAMUSCULAR | Status: DC
  Administered 2022-02-24 – 2022-02-25 (×2): 40 mg via SUBCUTANEOUS
  Filled 2022-02-24 (×2): qty 0.4

## 2022-02-24 MED ORDER — LACTATED RINGERS IV BOLUS
1000.0000 mL | Freq: Once | INTRAVENOUS | Status: AC
  Administered 2022-02-24: 1000 mL via INTRAVENOUS

## 2022-02-24 MED ORDER — GABAPENTIN 300 MG PO CAPS
300.0000 mg | ORAL_CAPSULE | Freq: Four times a day (QID) | ORAL | Status: DC
  Administered 2022-02-24 – 2022-02-26 (×6): 300 mg via ORAL
  Filled 2022-02-24 (×6): qty 1

## 2022-02-24 MED ORDER — NICOTINE 21 MG/24HR TD PT24: 21.0000 mg | MEDICATED_PATCH | Freq: Every day | TRANSDERMAL | Status: DC

## 2022-02-24 MED ORDER — HALOPERIDOL LACTATE 5 MG/ML IJ SOLN: 2.0000 mg | Freq: Four times a day (QID) | INTRAMUSCULAR | Status: DC | PRN

## 2022-02-24 MED ORDER — ACETAMINOPHEN 325 MG PO TABS
650.0000 mg | ORAL_TABLET | Freq: Four times a day (QID) | ORAL | Status: DC | PRN
Start: 2022-02-24 — End: 2022-02-26
  Administered 2022-02-25: 650 mg via ORAL
  Filled 2022-02-24: qty 2

## 2022-02-24 MED ORDER — TRAZODONE HCL 50 MG PO TABS
25.0000 mg | ORAL_TABLET | Freq: Every evening | ORAL | Status: DC | PRN
Start: 2022-02-24 — End: 2022-02-26
  Administered 2022-02-24: 25 mg via ORAL
  Filled 2022-02-24 (×2): qty 1

## 2022-02-24 MED ORDER — MAGNESIUM HYDROXIDE 400 MG/5ML PO SUSP
30.0000 mL | Freq: Every day | ORAL | Status: DC | PRN
Start: 2022-02-24 — End: 2022-02-26

## 2022-02-24 NOTE — Assessment & Plan Note (Signed)
-   We will continue Lexapro. 

## 2022-02-24 NOTE — H&P (Addendum)
[place]   PATIENT NAME: Nicole Chandler    MR#:  [id number]  DATE OF BIRTH:  February 23, 1961  DATE OF ADMISSION:  02/24/2022  PRIMARY CARE PHYSICIAN: Pcp, No   Patient is coming from: Home  REQUESTING/REFERRING PHYSICIAN: Shaune Pollack, MD  CHIEF COMPLAINT:   Chief Complaint  Patient presents with   Weakness    HISTORY OF PRESENT ILLNESS:  Nicole Chandler is a 61 y.o. Caucasian female with medical history significant for seizure disorder, and anxiety C and depression, polysubstance abuse, bipolar 1 disorder and osteoarthritis, who presented to the emergency room with acute onset of generalized weakness, altered mental status with confusion and seizure-like episode.  The patient was fairly weak for the past couple of weeks and has been having intermittent nausea and vomiting that she felt could be related to worsening reflux.  She was fairly repeated and during the interview was fairly restless, agitated and having brief visual hallucinations.  She has not been taking her medications as often as she should and her Dilantin level came back less than 2.5.  She felt weak earlier during the day shaky and out of it per her boyfriend was with her.  She stated that her legs were wobbly and were going to give out.  She felt like a seizure was coming.  She admits to recent worsening stress and has been having polydipsia and diminished urine output.  She admits to mild headache without dizziness or blurred vision or paresthesias or focal muscle weakness.  No fever or chills.  No dysuria, oliguria, urinary frequency or urgency or flank pain.  No chest pain or palpitations.  ED Course: Upon presentation to the emergency room, temperature was 99.3, heart rate was 106 with otherwise normal vital signs.  Labs revealed mild hyponatremia 134 and a creatinine 1.32 with a normal with otherwise normal BMP.  CBC showed hemoglobin of 11.8 and hematocrit 36.8.  Dilantin level was less than 2.5.  UA was  unremarkable EKG as reviewed by me : EKG showed sinus tachycardia with a rate of 108 with Q waves laterally. Imaging: Two-view chest x-ray showed no acute cardiopulmonary disease.  Noncontrasted CT scan revealed no acute intracranial abnormalities.  The patient was given 1.5 L bolus of IV lactated Ringer, loading dose of IV Dilantin and 1 mg of IV Ativan.  After consultation with neurology recommendation was for switching Dilantin to p.o. Keppra.  She will be admitted to an observation medical telemetry bed for further evaluation and management. PAST MEDICAL HISTORY:   Past Medical History:  Diagnosis Date   Arthritis    Back pain    Bipolar 1 disorder (HCC)    Chronic back pain unk   Depression    Panic attacks    Sciatica    Seizures (HCC)     PAST SURGICAL HISTORY:   Past Surgical History:  Procedure Laterality Date   ABDOMINAL SURGERY     EYE SURGERY      SOCIAL HISTORY:   Social History   Tobacco Use   Smoking status: Every Day    Packs/day: 1.00    Types: Cigarettes    Last attempt to quit: 08/27/2014    Years since quitting: 7.5   Smokeless tobacco: Never  Substance Use Topics   Alcohol use: No    FAMILY HISTORY:   Family History  Problem Relation Age of Onset   Alzheimer's disease Mother    AAA (abdominal aortic aneurysm) Father    Breast  cancer Sister 70   Heart disease Brother     DRUG ALLERGIES:   Allergies  Allergen Reactions   Cyclobenzaprine     "makes the pt jumpy"   Naproxen Sodium     Intestinal bloating that was so bad she had to go to the ER    REVIEW OF SYSTEMS:   ROS As per history of present illness. All pertinent systems were reviewed above. Constitutional, HEENT, cardiovascular, respiratory, GI, GU, musculoskeletal, neuro, psychiatric, endocrine, integumentary and hematologic systems were reviewed and are otherwise negative/unremarkable except for positive findings mentioned above in the HPI.   MEDICATIONS AT HOME:   Prior to  Admission medications   Medication Sig Start Date End Date Taking? Authorizing Provider  gabapentin (NEURONTIN) 300 MG capsule Take 1 capsule (300 mg total) by mouth 4 (four) times daily. 09/03/18   Pucilowska, Jolanta B, MD  lidocaine (LIDODERM) 5 % Place 1 patch onto the skin daily. Remove & Discard patch within 12 hours or as directed by MD 09/14/18   Enid Derry, PA-C  mirtazapine (REMERON) 15 MG tablet Take 1 tablet (15 mg total) by mouth at bedtime for 30 days. 09/03/18 10/03/18  Pucilowska, Braulio Conte B, MD  nicotine (NICODERM CQ - DOSED IN MG/24 HOURS) 21 mg/24hr patch Place 1 patch (21 mg total) onto the skin daily. 03/02/20   Mauro Kaufmann, NP  phenytoin (DILANTIN) 100 MG ER capsule Take 1 capsule (100 mg total) by mouth 3 (three) times daily. 09/03/18 12/02/18  Pucilowska, Ellin Goodie, MD      VITAL SIGNS:  Blood pressure 121/77, pulse 79, temperature 99.3 F (37.4 C), temperature source Oral, resp. rate 19, height 5\' 2"  (1.575 m), weight 59 kg, last menstrual period 12/17/2007, SpO2 96 %.  PHYSICAL EXAMINATION:  Physical Exam  GENERAL:  61 y.o.-year-old Caucasian female patient lying in the bed with no acute distress.  She was fairly agitated and restless.  Her boyfriend was holding her not to get out of bed. EYES: Pupils equal, round, reactive to light and accommodation. No scleral icterus. Extraocular muscles intact.  HEENT: Head atraumatic, normocephalic. Oropharynx and nasopharynx clear.  NECK:  Supple, no jugular venous distention. No thyroid enlargement, no tenderness.  LUNGS: Normal breath sounds bilaterally, no wheezing, rales,rhonchi or crepitation. No use of accessory muscles of respiration.  CARDIOVASCULAR: Regular rate and rhythm, S1, S2 normal. No murmurs, rubs, or gallops.  ABDOMEN: Soft, nondistended, nontender. Bowel sounds present. No organomegaly or mass.  EXTREMITIES: No pedal edema, cyanosis, or clubbing.  NEUROLOGIC: Cranial nerves II through XII are intact. Muscle  strength 5/5 in all extremities. Sensation intact. Gait not checked.  PSYCHIATRIC: The patient is alert and oriented x 2 to place and person but not to time.  She was restless and mildly agitated.  She was having brief visual hallucinations. SKIN: No obvious rash, lesion, or ulcer.   LABORATORY PANEL:   CBC Recent Labs  Lab 02/24/22 1217  WBC 8.8  HGB 11.8*  HCT 36.8  PLT 272   ------------------------------------------------------------------------------------------------------------------  Chemistries  Recent Labs  Lab 02/24/22 1217  NA 134*  K 3.7  CL 105  CO2 21*  GLUCOSE 130*  BUN 14  CREATININE 1.32*  CALCIUM 9.1   ------------------------------------------------------------------------------------------------------------------  Cardiac Enzymes No results for input(s): "TROPONINI" in the last 168 hours. ------------------------------------------------------------------------------------------------------------------  RADIOLOGY:  CT HEAD WO CONTRAST (04/27/22)  Result Date: 02/24/2022 CLINICAL DATA:  Headache EXAM: CT HEAD WITHOUT CONTRAST TECHNIQUE: Contiguous axial images were obtained from the base of the  skull through the vertex without intravenous contrast. RADIATION DOSE REDUCTION: This exam was performed according to the departmental dose-optimization program which includes automated exposure control, adjustment of the mA and/or kV according to patient size and/or use of iterative reconstruction technique. COMPARISON:  None Available. FINDINGS: Brain: No acute intracranial hemorrhage, mass effect, or herniation. No extra-axial fluid collections. No evidence of acute territorial infarct. No hydrocephalus. Vascular: No hyperdense vessel or unexpected calcification. Skull: Normal. Negative for fracture or focal lesion. Sinuses/Orbits: No acute finding. Other: None. IMPRESSION: No acute intracranial process identified. Electronically Signed   By: Jannifer Hick M.D.   On:  02/24/2022 16:24   DG Chest 2 View  Result Date: 02/24/2022 CLINICAL DATA:  Weakness EXAM: CHEST - 2 VIEW COMPARISON:  Radiograph 08/31/2018 FINDINGS: Unchanged cardiomediastinal silhouette. There is no focal airspace consolidation. There is no pleural effusion. No pneumothorax. No acute osseous abnormality. Thoracic spondylosis. Chronic right posterior sixth rib injury. IMPRESSION: No evidence of acute cardiopulmonary disease. Electronically Signed   By: Caprice Renshaw M.D.   On: 02/24/2022 15:52      IMPRESSION AND PLAN:  Assessment and Plan: * Altered mental state - The patient was admitted in observation medical telemetry bed. - We will follow neurochecks every 4 hours for 24 hours. - Her noncontrasted head CT scan revealed no acute intracranial abnormalities. - Differential diagnosis would include partial seizures.  Will need to rule out frontal CVA.  She is fairly delirious and has visual hallucinations. - We will place on as needed IM Haldol as well as IV Ativan. - We will obtain a brain MRI to rule out frontal CVA. - Neurochecks will be followed every 4 hours for 24 hours. - This has been associated with generalized weakness. - Neurology consult will be obtained. - I notified Dr. Viviann Spare about the patient. - Psychiatry consult will be obtained. - I notified Dr. Marlou Porch about the patient.  Seizure [facility]) - The patient will be placed on seizures precautions. - Per neurology recommendation we will start on p.o. Keppra replacing Dilantin since she has not been taking it and reported side effects with it. - An EEG will be obtained. - Neurology consult will be obtained as mentioned above.  Polysubstance abuse (HCC) -This Includes Tobacco, cocaine and marijuana as well as opioid abuse. - He was counseled for cessation. - Urine drug screen will be obtained as this could be of related to her symptoms.  Chronic bronchitis (HCC) - We will continue her inhalers  Dyslipidemia - We will  continue statin therapy.  Major depressive disorder - We will continue Lexapro.   DVT prophylaxis: Lovenox.  Advanced Care Planning:  Code Status: full code.  Family Communication:  The plan of care was discussed in details with the patient (and family). I answered all questions. The patient agreed to proceed with the above mentioned plan. Further management will depend upon hospital course. Disposition Plan: Back to previous home environment Consults called: Neurology. All the records are reviewed and case discussed with ED provider.  Status is: Observation  I certify that at the time of admission, it is my clinical judgment that the patient will require  hospital care extending less than 2 midnights.                            Dispo: The patient is from: Home              Anticipated d/c is to: Home  Patient currently is not medically stable to d/c.              Difficult to place patient: No  Hannah Beat M.D on 02/24/2022 at 10:05 PM  [facility]   From 7 PM-7 AM, contact night-coverage [email or web address]  CC: Primary care physician; Pcp, No

## 2022-02-24 NOTE — Assessment & Plan Note (Signed)
-   We will continue statin therapy. 

## 2022-02-24 NOTE — ED Notes (Signed)
Pt reports feeling weird, dizzy, lightheaded. Pt is also crying and laughing at the same time. See vs

## 2022-02-24 NOTE — Assessment & Plan Note (Signed)
-   We will continue her inhalers. 

## 2022-02-24 NOTE — ED Provider Notes (Signed)
[facility] Provider Note    Event Date/Time   First MD Initiated Contact with Patient 02/24/22 1512     (approximate)   History   Weakness   HPI  Nicole Chandler is a 61 y.o. female  here with generalized weakness and seizure-like episode. Pt reports that she has felt "very weak" for the past week or two, has had some intermittent nausea/vomiting but states this may be due to stress and worsening reflux. Reports she has not necessarily been eating/drinking much and has had worsening stress and PTSD. Has also not been taking her medication phenytoin as often as she should. Reports that earlier in the day, she began to feel "more weak," shaky, and "out of it." She felt like her legs were "wobbly" and going to give out. She states she felt like a seizure was "coming on" so she told her friend. She arrives for evaluation. Reports significant recent stress. Endorses dry mouth, decreased UOP. Reports a mild HA which is not unusual for her. No other complaints. No recent fevers, chills. No cough or SOB. No CP.      Physical Exam   Triage Vital Signs: ED Triage Vitals  Enc Vitals Group     BP 02/24/22 1215 106/65     Pulse Rate 02/24/22 1215 (!) 106     Resp 02/24/22 1215 20     Temp 02/24/22 1215 99.3 F (37.4 C)     Temp Source 02/24/22 1215 Oral     SpO2 02/24/22 1215 95 %     Weight 02/24/22 1449 130 lb 1.1 oz (59 kg)     Height 02/24/22 1449 5\' 2"  (1.575 m)     Head Circumference --      Peak Flow --      Pain Score 02/24/22 1209 0     Pain Loc --      Pain Edu? --      Excl. in GC? --     Most recent vital signs: Vitals:   02/24/22 2005 02/25/22 0109  BP: 121/77 110/60  Pulse: 79 67  Resp: 19 15  Temp:    SpO2: 96% 99%     General: Awake, no distress.  CV:  Good peripheral perfusion. RRR. 2/6 systolic murmur appreciated. Resp:  Normal effort. No wheezes. Normal WOB. No r/r. Abd:  No distention. No tenderness. Other:  Dry MM. Tearful,  anxious. CNII-XII intact, strength 5/5, sensation to light touch, FTN and gait intact.   ED Results / Procedures / Treatments   Labs (all labs ordered are listed, but only abnormal results are displayed) Labs Reviewed  BASIC METABOLIC PANEL - Abnormal; Notable for the following components:      Result Value   Sodium 134 (*)    CO2 21 (*)    Glucose, Bld 130 (*)    Creatinine, Ser 1.32 (*)    GFR, Estimated 46 (*)    All other components within normal limits  CBC - Abnormal; Notable for the following components:   RBC 3.68 (*)    Hemoglobin 11.8 (*)    All other components within normal limits  URINALYSIS, ROUTINE W REFLEX MICROSCOPIC - Abnormal; Notable for the following components:   Color, Urine YELLOW (*)    APPearance CLEAR (*)    Hgb urine dipstick SMALL (*)    All other components within normal limits  PHENYTOIN LEVEL, TOTAL - Abnormal; Notable for the following components:   Phenytoin Lvl <2.5 (*)  All other components within normal limits  URINE DRUG SCREEN, QUALITATIVE ([facility] ONLY) - Abnormal; Notable for the following components:   Tricyclic, Ur Screen POSITIVE (*)    Cannabinoid 50 Ng, Ur [place] POSITIVE (*)    All other components within normal limits  BASIC METABOLIC PANEL  CBC  HIV ANTIBODY (ROUTINE TESTING W REFLEX)  CBG MONITORING, ED  POC URINE PREG, ED     EKG Sinus tachycardia, VR 108. PR 140, QRS 72, QTc 404. No acute ST elevations or depressions. No ischemia or infarct.   RADIOLOGY CT head: No acute intracranial process Chest x-ray: No evidence of acute cardiopulmonary disease   I also independently reviewed and agree with radiologist interpretations.   PROCEDURES:  Critical Care performed: No    MEDICATIONS ORDERED IN ED: Medications  nicotine (NICODERM CQ - dosed in mg/24 hours) patch 21 mg (21 mg Transdermal Patch Applied 02/24/22 1858)  enoxaparin (LOVENOX) injection 40 mg (40 mg Subcutaneous Given 02/24/22 2238)  0.9 %  sodium chloride  infusion ( Intravenous New Bag/Given 02/25/22 0001)  acetaminophen (TYLENOL) tablet 650 mg (has no administration in time range)    Or  acetaminophen (TYLENOL) suppository 650 mg (has no administration in time range)  traZODone (DESYREL) tablet 25 mg (25 mg Oral Given 02/24/22 2236)  ondansetron (ZOFRAN) tablet 4 mg (has no administration in time range)    Or  ondansetron (ZOFRAN) injection 4 mg (has no administration in time range)  magnesium hydroxide (MILK OF MAGNESIA) suspension 30 mL (has no administration in time range)  LORazepam (ATIVAN) injection 1 mg (1 mg Intravenous Given 02/24/22 1952)  levETIRAcetam (KEPPRA) tablet 500 mg (500 mg Oral Given 02/24/22 2235)  mirtazapine (REMERON) tablet 15 mg (15 mg Oral Given 02/24/22 2236)  gabapentin (NEURONTIN) capsule 300 mg (300 mg Oral Given 02/24/22 2235)  haloperidol lactate (HALDOL) injection 2 mg (has no administration in time range)  lactated ringers bolus 1,000 mL (0 mLs Intravenous Stopped 02/24/22 1652)  lactated ringers bolus 500 mL (0 mLs Intravenous Stopped 02/24/22 1724)  phenytoin (DILANTIN) 885 mg in sodium chloride 0.9 % 250 mL IVPB (0 mg Intravenous Stopped 02/24/22 1855)  LORazepam (ATIVAN) injection 1 mg (1 mg Intravenous Given 02/24/22 1853)     IMPRESSION / MDM / ASSESSMENT AND PLAN / ED COURSE  I reviewed the triage vital signs and the nursing notes.                               The patient is on the cardiac monitor to evaluate for evidence of arrhythmia and/or significant heart rate changes.   Ddx:  Differential includes the following, with pertinent life- or limb-threatening emergencies considered:  Generalized weakness due to malnutrition, dehydration, occult infection such as UTI or pneumonia, with possible partial seizure, versus orthostasis, versus anxiety  Patient's presentation is most consistent with acute presentation with potential threat to life or bodily function.  MDM:  61 year old female with past medical  history of polysubstance abuse, seizures, depression, here with generalized weakness, possible seizure-like activity.  Patient states that she felt very weak, shaky, and "out of it."  She states that she was confused.  This is reportedly what she has done during a seizure in the past.  She is also a general seizures but had no generalized activity today.  Clinically, she appears dry, admits to not eating and drinking much.  She has been under significant recent increase stress, and I  suspect this may also be playing a role.  Patient's neuro exam is nonfocal.  CBC shows no leukocytosis or significant anemia.  BMP shows elevated creatinine above her baseline, with decreased bicarb consistent with likely dehydration.  She has been given IV fluids and feels much better.  Urinalysis without signs of UTI.  Phenytoin level undetectable.  Suspect this may have contributed.  She was given a phenytoin load here.    After phenytoin load, pt had another episode of feeling tremulous, with confusion. She appears agitated on exam. No generalized seizure activity. This is similar to her episode earlier today, and pt states she has had these episodes from partial seizures before. She also now tells me that she was not taking the DIlantin because of its side effects, and that she had forgotten to tell me this. Some of her sx could be anxiety/effect of Dilantin as well.  Discussed with Neurology via telephone, will switch to [place]. Will admit to obs for possible spot EEG, monitoring for return to baseline.   MEDICATIONS GIVEN IN ED: Medications  nicotine (NICODERM CQ - dosed in mg/24 hours) patch 21 mg (21 mg Transdermal Patch Applied 02/24/22 1858)  enoxaparin (LOVENOX) injection 40 mg (40 mg Subcutaneous Given 02/24/22 2238)  0.9 %  sodium chloride infusion ( Intravenous New Bag/Given 02/25/22 0001)  acetaminophen (TYLENOL) tablet 650 mg (has no administration in time range)    Or  acetaminophen (TYLENOL) suppository 650 mg  (has no administration in time range)  traZODone (DESYREL) tablet 25 mg (25 mg Oral Given 02/24/22 2236)  ondansetron (ZOFRAN) tablet 4 mg (has no administration in time range)    Or  ondansetron (ZOFRAN) injection 4 mg (has no administration in time range)  magnesium hydroxide (MILK OF MAGNESIA) suspension 30 mL (has no administration in time range)  LORazepam (ATIVAN) injection 1 mg (1 mg Intravenous Given 02/24/22 1952)  levETIRAcetam (KEPPRA) tablet 500 mg (500 mg Oral Given 02/24/22 2235)  mirtazapine (REMERON) tablet 15 mg (15 mg Oral Given 02/24/22 2236)  gabapentin (NEURONTIN) capsule 300 mg (300 mg Oral Given 02/24/22 2235)  haloperidol lactate (HALDOL) injection 2 mg (has no administration in time range)  lactated ringers bolus 1,000 mL (0 mLs Intravenous Stopped 02/24/22 1652)  lactated ringers bolus 500 mL (0 mLs Intravenous Stopped 02/24/22 1724)  phenytoin (DILANTIN) 885 mg in sodium chloride 0.9 % 250 mL IVPB (0 mg Intravenous Stopped 02/24/22 1855)  LORazepam (ATIVAN) injection 1 mg (1 mg Intravenous Given 02/24/22 1853)     Consults:     EMR reviewed  Prior neurology notes, prior psychiatry notes, reviewed previous phenytoin levels as well which are often low/undetectable     FINAL CLINICAL IMPRESSION(S) / ED DIAGNOSES   Final diagnoses:  Weakness  Dehydration  Subtherapeutic phenytoin level     Rx / DC Orders   ED Discharge Orders     None        Note:  This document was prepared using Dragon voice recognition software and may include unintentional dictation errors.   Duffy Bruce, MD 02/25/22 [phone]

## 2022-02-24 NOTE — ED Triage Notes (Addendum)
Pt comes with c/o weakness. Pt states she has been stressed out and lives alone. Pt states she has hx of seizures. Pt state she doesn't really take her meds like she is suppose to. Pt speaking in complete sentences. Pt upset and tearful. Mini neuro neg.

## 2022-02-24 NOTE — Assessment & Plan Note (Signed)
-   The patient will be placed on seizures precautions. - Per neurology recommendation we will start on p.o. Keppra replacing Dilantin since she has not been taking it and reported side effects with it. - An EEG will be obtained. - Neurology consult will be obtained as mentioned above.

## 2022-02-24 NOTE — ED Notes (Signed)
MRI in room to transport pt at this time

## 2022-02-24 NOTE — Assessment & Plan Note (Signed)
-  This Includes Tobacco, cocaine and marijuana as well as opioid abuse. - He was counseled for cessation. - Urine drug screen will be obtained as this could be of related to her symptoms.

## 2022-02-24 NOTE — Assessment & Plan Note (Addendum)
-   The patient was admitted in observation medical telemetry bed. - We will follow neurochecks every 4 hours for 24 hours. - Her noncontrasted head CT scan revealed no acute intracranial abnormalities. - Differential diagnosis would include partial seizures.  Will need to rule out frontal CVA.  She is fairly delirious and has visual hallucinations. - We will place on as needed IM Haldol as well as IV Ativan. - We will obtain a brain MRI to rule out frontal CVA. - Neurochecks will be followed every 4 hours for 24 hours. - This has been associated with generalized weakness. - Neurology consult will be obtained. - I notified Dr. Viviann Spare about the patient. - Psychiatry consult will be obtained. - I notified Dr. Marlou Porch about the patient.

## 2022-02-24 NOTE — Discharge Instructions (Signed)
It is critically important that you take your Phenytoin as prescribed  Follow-up with your Neurologist in the next 1-2 weeks  Drink plenty of fluids

## 2022-02-25 DIAGNOSIS — F17210 Nicotine dependence, cigarettes, uncomplicated: Secondary | ICD-10-CM | POA: Diagnosis present

## 2022-02-25 DIAGNOSIS — G8929 Other chronic pain: Secondary | ICD-10-CM | POA: Diagnosis present

## 2022-02-25 DIAGNOSIS — F411 Generalized anxiety disorder: Secondary | ICD-10-CM | POA: Diagnosis not present

## 2022-02-25 DIAGNOSIS — Z79899 Other long term (current) drug therapy: Secondary | ICD-10-CM | POA: Diagnosis not present

## 2022-02-25 DIAGNOSIS — F1410 Cocaine abuse, uncomplicated: Secondary | ICD-10-CM | POA: Diagnosis present

## 2022-02-25 DIAGNOSIS — G40802 Other epilepsy, not intractable, without status epilepticus: Secondary | ICD-10-CM | POA: Diagnosis not present

## 2022-02-25 DIAGNOSIS — E860 Dehydration: Secondary | ICD-10-CM | POA: Diagnosis not present

## 2022-02-25 DIAGNOSIS — E785 Hyperlipidemia, unspecified: Secondary | ICD-10-CM | POA: Diagnosis present

## 2022-02-25 DIAGNOSIS — M1990 Unspecified osteoarthritis, unspecified site: Secondary | ICD-10-CM | POA: Diagnosis present

## 2022-02-25 DIAGNOSIS — F410 Panic disorder [episodic paroxysmal anxiety] without agoraphobia: Secondary | ICD-10-CM | POA: Diagnosis present

## 2022-02-25 DIAGNOSIS — Z91199 Patient's noncompliance with other medical treatment and regimen due to unspecified reason: Secondary | ICD-10-CM | POA: Diagnosis not present

## 2022-02-25 DIAGNOSIS — R569 Unspecified convulsions: Secondary | ICD-10-CM | POA: Diagnosis not present

## 2022-02-25 DIAGNOSIS — R451 Restlessness and agitation: Secondary | ICD-10-CM | POA: Diagnosis not present

## 2022-02-25 DIAGNOSIS — Z91148 Patient's other noncompliance with medication regimen for other reason: Secondary | ICD-10-CM | POA: Diagnosis not present

## 2022-02-25 DIAGNOSIS — R69 Illness, unspecified: Secondary | ICD-10-CM | POA: Diagnosis not present

## 2022-02-25 DIAGNOSIS — R4182 Altered mental status, unspecified: Secondary | ICD-10-CM | POA: Diagnosis not present

## 2022-02-25 DIAGNOSIS — F319 Bipolar disorder, unspecified: Secondary | ICD-10-CM | POA: Diagnosis not present

## 2022-02-25 DIAGNOSIS — T420X6A Underdosing of hydantoin derivatives, initial encounter: Secondary | ICD-10-CM | POA: Diagnosis present

## 2022-02-25 DIAGNOSIS — F1210 Cannabis abuse, uncomplicated: Secondary | ICD-10-CM | POA: Diagnosis not present

## 2022-02-25 DIAGNOSIS — Z888 Allergy status to other drugs, medicaments and biological substances status: Secondary | ICD-10-CM | POA: Diagnosis not present

## 2022-02-25 DIAGNOSIS — G40909 Epilepsy, unspecified, not intractable, without status epilepticus: Secondary | ICD-10-CM | POA: Diagnosis not present

## 2022-02-25 DIAGNOSIS — F1110 Opioid abuse, uncomplicated: Secondary | ICD-10-CM | POA: Diagnosis present

## 2022-02-25 DIAGNOSIS — J449 Chronic obstructive pulmonary disease, unspecified: Secondary | ICD-10-CM | POA: Diagnosis not present

## 2022-02-25 DIAGNOSIS — F4310 Post-traumatic stress disorder, unspecified: Secondary | ICD-10-CM | POA: Diagnosis present

## 2022-02-25 DIAGNOSIS — R404 Transient alteration of awareness: Secondary | ICD-10-CM | POA: Diagnosis not present

## 2022-02-25 DIAGNOSIS — Z716 Tobacco abuse counseling: Secondary | ICD-10-CM | POA: Diagnosis not present

## 2022-02-25 DIAGNOSIS — E871 Hypo-osmolality and hyponatremia: Secondary | ICD-10-CM | POA: Diagnosis not present

## 2022-02-25 LAB — BASIC METABOLIC PANEL
Anion gap: 4 — ABNORMAL LOW (ref 5–15)
BUN: 9 mg/dL (ref 6–20)
CO2: 25 mmol/L (ref 22–32)
Calcium: 8.3 mg/dL — ABNORMAL LOW (ref 8.9–10.3)
Chloride: 110 mmol/L (ref 98–111)
Creatinine, Ser: 0.88 mg/dL (ref 0.44–1.00)
GFR, Estimated: >60 mL/min (ref >60)
Glucose, Bld: 80 mg/dL (ref 70–99)
Potassium: 4.3 mmol/L (ref 3.5–5.1)
Sodium: 139 mmol/L (ref 135–145)

## 2022-02-25 LAB — CBC
HCT: 34.2 % — ABNORMAL LOW (ref 36.0–46.0)
Hemoglobin: 11 g/dL — ABNORMAL LOW (ref 12.0–15.0)
MCH: 31.7 pg (ref 26.0–34.0)
MCHC: 32.2 g/dL (ref 30.0–36.0)
MCV: 98.6 fL (ref 80.0–100.0)
Platelets: 242 10*3/uL (ref 150–400)
RBC: 3.47 MIL/uL — ABNORMAL LOW (ref 3.87–5.11)
RDW: 12.8 % (ref 11.5–15.5)
WBC: 6.9 10*3/uL (ref 4.0–10.5)
nRBC: 0 % (ref 0.0–0.2)

## 2022-02-25 MED ORDER — NICOTINE 21 MG/24HR TD PT24
21.0000 mg | MEDICATED_PATCH | Freq: Once | TRANSDERMAL | Status: AC
  Administered 2022-02-25: 21 mg via TRANSDERMAL
  Filled 2022-02-25: qty 1

## 2022-02-25 MED ORDER — BUSPIRONE HCL 10 MG PO TABS
5.0000 mg | ORAL_TABLET | Freq: Two times a day (BID) | ORAL | Status: DC
  Administered 2022-02-25 – 2022-02-26 (×3): 5 mg via ORAL
  Filled 2022-02-25 (×3): qty 1

## 2022-02-25 MED ORDER — PHENYTOIN SODIUM EXTENDED 100 MG PO CAPS
100.0000 mg | ORAL_CAPSULE | Freq: Three times a day (TID) | ORAL | Status: DC
  Administered 2022-02-25 – 2022-02-26 (×3): 100 mg via ORAL
  Filled 2022-02-25 (×3): qty 1

## 2022-02-25 MED ORDER — BUSPIRONE HCL 5 MG PO TABS
7.5000 mg | ORAL_TABLET | Freq: Two times a day (BID) | ORAL | Status: DC
Start: 2022-02-25 — End: 2022-02-25

## 2022-02-25 NOTE — ED Notes (Signed)
PT ARRIVED BACK FROM MRI AT THIS TIME.

## 2022-02-25 NOTE — Consult Note (Addendum)
[facility] Face-to-Face Psychiatry Consult   Reason for Consult:  AMS Referring Physician:  Dahal Patient Identification: Nicole Chandler MRN:  [id number] Principal Diagnosis: Altered mental state Diagnosis:  Principal Problem:   Altered mental state Active Problems:   Seizure (HCC)   Major depressive disorder   Dyslipidemia   Polysubstance abuse (HCC)   Chronic bronchitis (HCC)   Altered mental status   Total Time spent with patient: 45 minutes  Subjective:   Nicole Chandler is a 61 y.o. female patient admitted with AMS.  HPI:  Patient is on the medical service, seen in consultation for Altered mental status. On approach, she is sitting up in bed with her companion sitting in bed with her. She is smiling appropriately, alert and oriented x 3.  Patient denies Suicidal or homicidal ideation. Denies current auditory and visual hallucinations. Does not appear to be responding to internal stimuli. She reports seeing family that were not in the room today. When asked about psychiatric history, she states that she has been hospitalized for "drug problems" but she states she has been off drugs for more than 8 years, aside from cannabis. Patient does appear anxious and a bit labile at times, with some crying on and off.  Patient became loud at the point when writer suggested going to [facility] for mental health treatment when she is discharged; patient states she is triggered by that because she claims that people who use drugs hang around there. Patient apologized for her reaction when I explained that they do plenty of mental health treatment, not just substance use treatment. I also told her that there are other resources in the community for mental health treatment with her insurance. Recommended trying website "[employer]."  Patient states that she has anxiety and would like to try something aside from benzodiazepine. She says she is very sensitive to medications and wants to start low.   Patient  remains on the medical service.   UDS positive for cannabinoids and tricyclics.   Past Psychiatric History: substance use, 7-8 years sober  Risk to Self:   Risk to Others:   Prior Inpatient Therapy:   Prior Outpatient Therapy:    Past Medical History:  Past Medical History:  Diagnosis Date   Arthritis    Back pain    Bipolar 1 disorder (HCC)    Chronic back pain unk   Depression    Panic attacks    Sciatica    Seizures (HCC)     Past Surgical History:  Procedure Laterality Date   ABDOMINAL SURGERY     EYE SURGERY     Family History:  Family History  Problem Relation Age of Onset   Alzheimer's disease Mother    AAA (abdominal aortic aneurysm) Father    Breast cancer Sister 66   Heart disease Brother    Family Psychiatric  History: none reported Social History:  Social History   Substance and Sexual Activity  Alcohol Use No     Social History   Substance and Sexual Activity  Drug Use Not Currently   Types: Cocaine, Marijuana    Social History   Socioeconomic History   Marital status: Single    Spouse name: Not on file   Number of children: Not on file   Years of education: Not on file   Highest education level: Not on file  Occupational History   Not on file  Tobacco Use   Smoking status: Every Day    Packs/day: 1.00  Types: Cigarettes    Last attempt to quit: 08/27/2014    Years since quitting: 7.5   Smokeless tobacco: Never  Vaping Use   Vaping Use: Never used  Substance and Sexual Activity   Alcohol use: No   Drug use: Not Currently    Types: Cocaine, Marijuana   Sexual activity: Not Currently  Other Topics Concern   Not on file  Social History Narrative   ** Merged History Encounter **       Social Determinants of Health   Financial Resource Strain: Not on file  Food Insecurity: Not on file  Transportation Needs: Not on file  Physical Activity: Not on file  Stress: Not on file  Social Connections: Not on file   Additional  Social History:    Allergies:   Allergies  Allergen Reactions   Cyclobenzaprine     "makes the pt jumpy"   Naproxen Sodium     Intestinal bloating that was so bad she had to go to the ER    Labs:  Results for orders placed or performed during the hospital encounter of 02/24/22 (from the past 48 hour(s))  Basic metabolic panel     Status: Abnormal   Collection Time: 02/24/22 12:17 PM  Result Value Ref Range   Sodium 134 (L) 135 - 145 mmol/L   Potassium 3.7 3.5 - 5.1 mmol/L   Chloride 105 98 - 111 mmol/L   CO2 21 (L) 22 - 32 mmol/L   Glucose, Bld 130 (H) 70 - 99 mg/dL    Comment: Glucose reference range applies only to samples taken after fasting for at least 8 hours.   BUN 14 6 - 20 mg/dL   Creatinine, Ser 2.70 (H) 0.44 - 1.00 mg/dL   Calcium 9.1 8.9 - 62.3 mg/dL   GFR, Estimated 46 (L) >60 mL/min    Comment: (NOTE) Calculated using the CKD-EPI Creatinine Equation (2021)    Anion gap 8 5 - 15    Comment: Performed at [facility], [address] [place], [place] [address]  CBC     Status: Abnormal   Collection Time: 02/24/22 12:17 PM  Result Value Ref Range   WBC 8.8 4.0 - 10.5 K/uL   RBC 3.68 (L) 3.87 - 5.11 MIL/uL   Hemoglobin 11.8 (L) 12.0 - 15.0 g/dL   HCT 15.1 76.1 - 60.7 %   MCV 100.0 80.0 - 100.0 fL   MCH 32.1 26.0 - 34.0 pg   MCHC 32.1 30.0 - 36.0 g/dL   RDW 37.1 06.2 - 69.4 %   Platelets 272 150 - 400 K/uL   nRBC 0.0 0.0 - 0.2 %    Comment: Performed at [facility], [address] [place], [place] [address]  Urinalysis, Routine w reflex microscopic     Status: Abnormal   Collection Time: 02/24/22 12:29 PM  Result Value Ref Range   Color, Urine YELLOW (A) YELLOW   APPearance CLEAR (A) CLEAR   Specific Gravity, Urine 1.008 1.005 - 1.030   pH 5.0 5.0 - 8.0   Glucose, UA NEGATIVE NEGATIVE mg/dL   Hgb urine dipstick SMALL (A) NEGATIVE   Bilirubin Urine NEGATIVE NEGATIVE   Ketones, ur NEGATIVE NEGATIVE mg/dL   Protein, ur NEGATIVE  NEGATIVE mg/dL   Nitrite NEGATIVE NEGATIVE   Leukocytes,Ua NEGATIVE NEGATIVE   RBC / HPF 0-5 0 - 5 RBC/hpf   WBC, UA 0-5 0 - 5 WBC/hpf   Bacteria, UA NONE SEEN NONE SEEN   Squamous Epithelial /  LPF 0-5 0 - 5   Hyaline Casts, UA PRESENT     Comment: Performed at [facility], [address] [place], [place] [address]  Urine Drug Screen, Qualitative [facility] only)     Status: Abnormal   Collection Time: 02/24/22 12:29 PM  Result Value Ref Range   Tricyclic, Ur Screen POSITIVE (A) NONE DETECTED   Amphetamines, Ur Screen NONE DETECTED NONE DETECTED   MDMA (Ecstasy)Ur Screen NONE DETECTED NONE DETECTED   Cocaine Metabolite,Ur [place] NONE DETECTED NONE DETECTED   Opiate, Ur Screen NONE DETECTED NONE DETECTED   Phencyclidine (PCP) Ur S NONE DETECTED NONE DETECTED   Cannabinoid 50 Ng, Ur [place] POSITIVE (A) NONE DETECTED   Barbiturates, Ur Screen NONE DETECTED NONE DETECTED   Benzodiazepine, Ur Scrn NONE DETECTED NONE DETECTED   Methadone Scn, Ur NONE DETECTED NONE DETECTED    Comment: (NOTE) Tricyclics + metabolites, urine    Cutoff 1000 ng/mL Amphetamines + metabolites, urine  Cutoff 1000 ng/mL MDMA (Ecstasy), urine              Cutoff 500 ng/mL Cocaine Metabolite, urine          Cutoff 300 ng/mL Opiate + metabolites, urine        Cutoff 300 ng/mL Phencyclidine (PCP), urine         Cutoff 25 ng/mL Cannabinoid, urine                 Cutoff 50 ng/mL Barbiturates + metabolites, urine  Cutoff 200 ng/mL Benzodiazepine, urine              Cutoff 200 ng/mL Methadone, urine                   Cutoff 300 ng/mL  The urine drug screen provides only a preliminary, unconfirmed analytical test result and should not be used for non-medical purposes. Clinical consideration and professional judgment should be applied to any positive drug screen result due to possible interfering substances. A more specific alternate chemical method must be used in order to obtain a confirmed analytical result. Gas  chromatography / mass spectrometry (GC/MS) is the preferred confirm atory method. Performed at [facility], [address] [place], [place] [address]   Phenytoin level, total     Status: Abnormal   Collection Time: 02/24/22  3:45 PM  Result Value Ref Range   Phenytoin Lvl <2.5 (L) 10.0 - 20.0 ug/mL    Comment: Performed at [facility], [address] [place], [place] [address]  Basic metabolic panel     Status: Abnormal   Collection Time: 02/25/22  8:00 AM  Result Value Ref Range   Sodium 139 135 - 145 mmol/L   Potassium 4.3 3.5 - 5.1 mmol/L   Chloride 110 98 - 111 mmol/L   CO2 25 22 - 32 mmol/L   Glucose, Bld 80 70 - 99 mg/dL    Comment: Glucose reference range applies only to samples taken after fasting for at least 8 hours.   BUN 9 6 - 20 mg/dL   Creatinine, Ser 9.140.88 0.44 - 1.00 mg/dL   Calcium 8.3 (L) 8.9 - 10.3 mg/dL   GFR, Estimated >78>60 >29>60 mL/min    Comment: (NOTE) Calculated using the CKD-EPI Creatinine Equation (2021)    Anion gap 4 (L) 5 - 15    Comment: Performed at [facility], [address] [place], [place] [address]  CBC     Status: Abnormal   Collection Time: 02/25/22  8:00 AM  Result Value Ref Range   WBC 6.9 4.0 - 10.5 K/uL   RBC 3.47 (L) 3.87 - 5.11 MIL/uL   Hemoglobin 11.0 (L) 12.0 - 15.0 g/dL   HCT 78.9 (L) 38.1 - 01.7 %   MCV 98.6 80.0 - 100.0 fL   MCH 31.7 26.0 - 34.0 pg   MCHC 32.2 30.0 - 36.0 g/dL   RDW 51.0 25.8 - 52.7 %   Platelets 242 150 - 400 K/uL   nRBC 0.0 0.0 - 0.2 %    Comment: Performed at [facility], [address] [place], [place] [address]    Current Facility-Administered Medications  Medication Dose Route Frequency Provider Last Rate Last Admin   0.9 %  sodium chloride infusion   Intravenous Continuous Mansy, Jan A, MD 100 mL/hr at 02/25/22 0001 New Bag at 02/25/22 0001   acetaminophen (TYLENOL) tablet 650 mg  650 mg Oral Q6H PRN Mansy, Jan A, MD   650 mg at 02/25/22 1523   Or    acetaminophen (TYLENOL) suppository 650 mg  650 mg Rectal Q6H PRN Mansy, Jan A, MD       busPIRone (BUSPAR) tablet 5 mg  5 mg Oral BID Gabriel Cirri F, NP   5 mg at 02/25/22 1032   enoxaparin (LOVENOX) injection 40 mg  40 mg Subcutaneous Q24H Mansy, Jan A, MD   40 mg at 02/24/22 2238   gabapentin (NEURONTIN) capsule 300 mg  300 mg Oral QID Mansy, Jan A, MD   300 mg at 02/25/22 1742   haloperidol lactate (HALDOL) injection 2 mg  2 mg Intramuscular Q6H PRN Mansy, Jan A, MD       LORazepam (ATIVAN) injection 1 mg  1 mg Intravenous Q1H PRN Mansy, Jan A, MD   1 mg at 02/24/22 1952   magnesium hydroxide (MILK OF MAGNESIA) suspension 30 mL  30 mL Oral Daily PRN Mansy, Jan A, MD       mirtazapine (REMERON) tablet 15 mg  15 mg Oral QHS Mansy, Jan A, MD   15 mg at 02/24/22 2236   nicotine (NICODERM CQ - dosed in mg/24 hours) patch 21 mg  21 mg Transdermal Once Lorin Glass, MD   21 mg at 02/25/22 1523   ondansetron (ZOFRAN) tablet 4 mg  4 mg Oral Q6H PRN Mansy, Jan A, MD       Or   ondansetron [facility]) injection 4 mg  4 mg Intravenous Q6H PRN Mansy, Jan A, MD       phenytoin (DILANTIN) ER capsule 100 mg  100 mg Oral TID Lorin Glass, MD   100 mg at 02/25/22 1742   traZODone (DESYREL) tablet 25 mg  25 mg Oral QHS PRN Mansy, Jan A, MD   25 mg at 02/24/22 2236    Musculoskeletal: Strength & Muscle Tone: within normal limits Gait & Station:  did not observe Patient leans: N/A    Psychiatric Specialty Exam:  Presentation  General Appearance: Appropriate for Environment; Casual  Eye Contact:Good  Speech:Clear and Coherent  Speech Volume:Normal  Handedness:No data recorded  Mood and Affect  Mood:Anxious  Affect:Congruent   Thought Process  Thought Processes:Coherent  Descriptions of Associations:Intact  Orientation:Full (Time, Place and Person)  Thought Content:WDL  History of Schizophrenia/Schizoaffective disorder:No data recorded Duration of Psychotic Symptoms:No data  recorded Hallucinations:Hallucinations: None  Ideas of Reference:None  Suicidal Thoughts:Suicidal Thoughts: No  Homicidal Thoughts:Homicidal Thoughts: No   Sensorium  Memory:Immediate Good; Recent Good  Judgment:Good  Insight:Good  Executive Functions  Concentration:Fair  Attention Span:Fair  Recall:Good  [employer] of Knowledge:Good  Language:Good   Psychomotor Activity  Psychomotor Activity:Psychomotor Activity: Normal   Assets  Assets:Communication Skills; Desire for Improvement; Financial Resources/Insurance; Housing; Social Support; Resilience   Sleep  Sleep:Sleep: Fair   Physical Exam: Physical Exam Vitals and nursing note reviewed.  HENT:     Head: Normocephalic.     Nose: No congestion or rhinorrhea.  Eyes:     General:        Right eye: No discharge.        Left eye: No discharge.  Cardiovascular:     Rate and Rhythm: Normal rate.  Pulmonary:     Effort: Pulmonary effort is normal.  Musculoskeletal:        General: Normal range of motion.     Cervical back: Normal range of motion.  Skin:    General: Skin is dry.  Psychiatric:        Attention and Perception: Attention normal.        Mood and Affect: Mood is anxious.        Speech: Speech normal.        Behavior: Behavior is cooperative.        Thought Content: Thought content is not paranoid or delusional. Thought content does not include homicidal or suicidal ideation.        Cognition and Memory: Cognition normal.        Judgment: Judgment normal.    Review of Systems  HENT: Negative.    Skin: Negative.   Neurological: Negative.   Psychiatric/Behavioral:  Negative for hallucinations, substance abuse and suicidal ideas. The patient is nervous/anxious. The patient does not have insomnia.    Blood pressure 125/78, pulse 64, temperature 98.6 F (37 C), resp. rate 18, height 5\' 2"  (1.575 m), weight 59 kg, last menstrual period 12/17/2007, SpO2 98 %. Body mass index is 23.79  kg/m.  Treatment Plan Summary: Patient being admitted to medical unit. She is anxious and is amenable to medication for anxiety, other than benzo. Started trial of Buspar. Patient denies thoughts or plan of suicide. Reviewed with Dr. 12/19/2007.   Disposition: No evidence of imminent risk to self or others at present.   Patient does not meet criteria for psychiatric inpatient admission. Discussed crisis plan, support from social network, calling 911, coming to the Emergency Department, and calling Suicide Hotline.  Pola Corn, NP 02/25/2022 6:20 PM

## 2022-02-25 NOTE — Procedures (Signed)
Patient Name: Nicole Chandler  MRN: [id number]  Epilepsy Attending: Charlsie Quest  Referring Physician/Provider: Hannah Beat, MD Date: 02/25/2022 Duration: 27.17 mins  Patient history:  61 y.o. Caucasian female with medical history significant for seizure disorder, and anxiety C and depression, polysubstance abuse, bipolar 1 disorder and osteoarthritis, who presented to the emergency room with acute onset of generalized weakness, altered mental status with confusion and seizure-like episode. EEG to evaluate for seizure  Level of alertness: Awake, asleep  AEDs during EEG study: LEV, GBP  Technical aspects: This EEG study was done with scalp electrodes positioned according to the 10-20 International system of electrode placement. Electrical activity was acquired at a sampling rate of 500Hz  and reviewed with a high frequency filter of 70Hz  and a low frequency filter of 1Hz . EEG data were recorded continuously and digitally stored.   Description: The posterior dominant rhythm consists of 8-9 Hz activity of moderate voltage (25-35 uV) seen predominantly in posterior head regions, symmetric and reactive to eye opening and eye closing. Sleep was characterized by vertex waves, sleep spindles (12 to 14 Hz), maximal frontocentral region. Hyperventilation and photic stimulation were not performed.     IMPRESSION: This study is within normal limits. No seizures or epileptiform discharges were seen throughout the recording.  Masashi Snowdon 

## 2022-02-25 NOTE — Progress Notes (Signed)
Eeg done 

## 2022-02-25 NOTE — ED Notes (Signed)
Informed RN bed assigned 

## 2022-02-25 NOTE — ED Notes (Signed)
Pt assisted up to bedside toilet to urinate and back into bed with bed rails x3 in place and bed alarm reactivated. Spouse at bedside in recliner.

## 2022-02-25 NOTE — ED Notes (Signed)
Pt and family member given coffee

## 2022-02-25 NOTE — Progress Notes (Signed)
PROGRESS NOTE  Nicole Chandler  DOB: [birth year]  PCP: Aviva Kluver ZOX:[id number]  DOA: 02/24/2022  LOS: 0 days  Hospital Day: 2  Brief narrative: Nicole Chandler is a 62 y.o. female with PMH significant for seizure disorder, bipolar disorder, anxiety/depression, polysubstance abuse, osteoarthritis. Patient presented to the ED on 7/3 with complaint of acute onset of generalized weakness, altered mental status with confusion and seizure-like episode.  The patient was fairly weak for the past couple of weeks and has been having intermittent nausea and vomiting that she felt could be related to worsening reflux.  She was fairly repeated and during the interview was fairly restless, agitated and having brief visual hallucinations.  She has not been taking her medications as often as she should and her Dilantin level was less than 2.5.  She felt weak earlier during the day, shaky and out of it per her boyfriend who was with her.  She stated that her legs were wobbly and were going to give out.  She felt like a seizure was coming.  She admits to recent worsening stress and has been having polydipsia and diminished urine output.   In the ED, patient had a temperature of 98.3, heart rate was elevated to 106, blood pressure 106/65, O2 sat 95% on room air. Labs showed sodium level low at 134, creatinine elevated 1.32, hemoglobin 11.8, WBC 8.8, ferritin level was low at less than 2.5. Urinalysis showed clear yellow urine with small amount of hemoglobin, negative nitrite, negative leukocytes or bacteria. Urine drug screen positive for cannabinoids and tricyclic. Chest x-ray did not show any acute cardiopulmonary abnormality CT head did not show any acute intracranial normality. In the ED, patient was given IV fluid, loading dose of IV Dilantin and IV Ativan After consultation with neurology patient was switched from Dilantin to oral Keppra. Kept in observation to hospitalist service for further evaluation  management.  Subjective: Patient was seen and examined this morning.  Pleasant middle-aged Caucasian female.  Sitting up in bed.  Not in distress.  No new symptoms, seen by psychiatry earlier.  Buspirone started.  Pending EEG.  Pending neurology consultation. Chart reviewed Heart rate in 50s overnight, blood pressure in low normal range, breathing on room air Labs this morning with improvement in creatinine to 0.88  Assessment and plan: Altered mental status -Presented with altered mental status, confusion.  In the ED she was noted to be in delirium also with visual hallucinations.   -Probably related to drug use, and mental stress.  UDS positive for cannabinoid and tricyclic -Although she was noncompliant with Dilantin, it does not seem that she had a seizure  -CT head unremarkable.  MRI brain unremarkable.  History of seizure seizure  -Noncompliant to Dilantin at home.  Level low.   -Per neurology recommendation, she was switched from Dilantin to Keppra.  However patient tells me that she had adverse effects with Keppra and would like to resume Dilantin.  I switched her back to Dilantin per her request. -EEG this afternoon. -Neuro consult called by admitting physician  Generalized weakness -PT eval   Polysubstance abuse  -Including tobacco, cocaine, marijuana, opioid.  UDS positive for cannabinoid and tricyclic. -Counseled to quit   Chronic bronchitis  -Continue inhalers.  Counseled to stop smoking   Dyslipidemia -continue statin therapy.   Major depressive disorder -Psychiatry started on BuSpar.  Goals of care   Code Status: Full Code    Mobility: Encourage ambulation  Skin assessment:     Nutritional  status:  Body mass index is 23.79 kg/m.          Diet:  Diet Order             Diet Heart Room service appropriate? Yes; Fluid consistency: Thin  Diet effective now                   DVT prophylaxis:  enoxaparin (LOVENOX) injection 40 mg Start:  02/24/22 2200   Antimicrobials: None Fluid: NS at 100 mill per hour Consultants: Neurology, psychiatry Family Communication: None at bedside  Status is: Inpatient  Continue in-hospital care because: Pending EEG, pending neurology consultation Level of care: Telemetry Medical   Dispo: The patient is from: Home              Anticipated d/c is to: Hopefully home tomorrow              Patient currently is not medically stable to d/c.   Difficult to place patient No     Infusions:   sodium chloride 100 mL/hr at 02/25/22 0001    Scheduled Meds:  busPIRone  5 mg Oral BID   enoxaparin (LOVENOX) injection  40 mg Subcutaneous Q24H   gabapentin  300 mg Oral QID   mirtazapine  15 mg Oral QHS   nicotine  21 mg Transdermal Once   phenytoin  100 mg Oral TID    PRN meds: acetaminophen **OR** acetaminophen, haloperidol lactate, LORazepam, magnesium hydroxide, ondansetron **OR** ondansetron (ZOFRAN) IV, traZODone   Antimicrobials: Anti-infectives (From admission, onward)    None       Objective: Vitals:   02/25/22 1200 02/25/22 1221  BP:  113/73  Pulse: 64 64  Resp:  15  Temp:  98.2 F (36.8 C)  SpO2: 96% 96%    Intake/Output Summary (Last 24 hours) at 02/25/2022 1602 Last data filed at 02/24/2022 1724 Gross per 24 hour  Intake 1500 ml  Output --  Net 1500 ml   Filed Weights   02/24/22 1449  Weight: 59 kg   Weight change:  Body mass index is 23.79 kg/m.   Physical Exam: General exam: Pleasant, middle-aged Caucasian female.  Not in physical distress Skin: No rashes, lesions or ulcers. HEENT: Atraumatic, normocephalic, no obvious bleeding Lungs: Clear to auscultation bilaterally CVS: Regular rate and rhythm, no murmur GI/Abd soft, nontender, nondistended, bowels are present CNS: Alert, awake, oriented x3 Psychiatry: Mood appropriate Extremities: No pedal edema, no calf tenderness  Data Review: I have personally reviewed the laboratory data and studies  available.  F/u labs ordered Unresulted Labs (From admission, onward)     Start     Ordered   03/03/22 0500  Creatinine, serum  (enoxaparin (LOVENOX)    CrCl >/= 30 ml/min)  Weekly,   R     Comments: while on enoxaparin therapy    02/24/22 1922   02/25/22 0500  HIV Antibody (routine testing w rflx)  Once,   R        02/25/22 0500            Signed, Lorin Glass, MD [facility] 02/25/2022

## 2022-02-25 NOTE — ED Notes (Signed)
Pt given breakfast tray

## 2022-02-25 NOTE — ED Notes (Signed)
Spouse out of room to inform that he is leaving to go home for medication and then return to ED. Pts bed alarm set, bed at lowest position and environment secured with call bell in place for pt use.

## 2022-02-25 NOTE — ED Notes (Signed)
This RN spoke with Dr. Pola Corn to confirm that pt should be admitted to inpatient room on the floor. Dr. Pola Corn states neuro still has to see pt. And pt. Has additional testing ordered, so send her to her floor inpatient room. Inpatient RN notified.

## 2022-02-25 NOTE — Procedures (Signed)
Transport went to get patient-she has left her room. Will attempt eeg if patient returns.

## 2022-02-26 DIAGNOSIS — G40802 Other epilepsy, not intractable, without status epilepticus: Secondary | ICD-10-CM | POA: Diagnosis not present

## 2022-02-26 DIAGNOSIS — F411 Generalized anxiety disorder: Secondary | ICD-10-CM

## 2022-02-26 LAB — HIV ANTIBODY (ROUTINE TESTING W REFLEX): HIV Screen 4th Generation wRfx: NONREACTIVE

## 2022-02-26 MED ORDER — ATORVASTATIN CALCIUM 20 MG PO TABS: 20.0000 mg | ORAL_TABLET | Freq: Every day | ORAL | 2 refills | Status: AC

## 2022-02-26 MED ORDER — BUDESONIDE-FORMOTEROL FUMARATE 80-4.5 MCG/ACT IN AERO: 2.0000 | INHALATION_SPRAY | Freq: Two times a day (BID) | RESPIRATORY_TRACT | 12 refills | Status: AC

## 2022-02-26 MED ORDER — ALBUTEROL SULFATE HFA 108 (90 BASE) MCG/ACT IN AERS
2.0000 | INHALATION_SPRAY | Freq: Four times a day (QID) | RESPIRATORY_TRACT | 0 refills | Status: AC | PRN
Start: 2022-02-26 — End: ?

## 2022-02-26 MED ORDER — BUSPIRONE HCL 5 MG PO TABS: 5.0000 mg | ORAL_TABLET | Freq: Two times a day (BID) | ORAL | 0 refills | Status: AC

## 2022-02-26 NOTE — Plan of Care (Signed)

## 2022-02-26 NOTE — TOC Initial Note (Signed)
Transition of Care [facility]) - Initial/Assessment Note    Patient Details  Name: Nicole Chandler MRN: [id number] Date of Birth: 01-27-1961  Transition of [facility]) CM/SW Contact:    Chapman Fitch, RN Phone Number: 02/26/2022, 10:40 AM  Clinical Narrative:                  Admitted for: AMS Admitted from: Home Alone ZSW:[id number] Pharmacy:[facility] - Friend takes her to appointments Current home health/prior home health/DME:NA  Patient states that her friend will return in 1 hour to transport her home at discharge  [facility] consult acknowledged for Medication assistance.  Patient confirms she is able to obtain all of her home medications with no issues.  Patient states "I just accidentally took to much"  Patient states sometimes she forgets when she takes her medications and takes an extra dose by accident.  Patient states she has made a chart to check off when she has taken her medication so she doesn't make the mistake again  Patient denies drug or alcohol use.  Denies SA resources           Patient Goals and CMS Choice        Expected Discharge Plan and Services           Expected Discharge Date: 02/26/22                                    Prior Living Arrangements/Services                       Activities of Daily Living Home Assistive Devices/Equipment: None ADL Screening (condition at time of admission) Patient's cognitive ability adequate to safely complete daily activities?: Yes Is the patient deaf or have difficulty hearing?: No Does the patient have difficulty seeing, even when wearing glasses/contacts?: No Does the patient have difficulty concentrating, remembering, or making decisions?: No Patient able to express need for assistance with ADLs?: Yes Does the patient have difficulty dressing or bathing?: No Independently performs ADLs?: Yes (appropriate for developmental age) Does the patient have difficulty walking or climbing stairs?:  No Weakness of Legs: None Weakness of Arms/Hands: None  Permission Sought/Granted                  Emotional Assessment              Admission diagnosis:  Dehydration [E86.0] Altered mental state [R41.82] Weakness [R53.1] Subtherapeutic phenytoin level [R78.89] Altered mental status [R41.82] Patient Active Problem List   Diagnosis Date Noted   Altered mental status 02/25/2022   Anxiety state 02/25/2022   Altered mental state 02/24/2022   Major depressive disorder 02/24/2022   Dyslipidemia 02/24/2022   Polysubstance abuse (HCC) 02/24/2022   Chronic bronchitis (HCC) 02/24/2022   Influenza 10/21/2018   Suicidal thoughts 09/01/2018   Major depressive disorder, recurrent severe without psychotic features (HCC) 09/01/2018   Major depressive disorder, single episode, severe without psychosis (HCC) 09/01/2018   Postictal state (HCC) 11/13/2017   Adjustment disorder with mixed disturbance of emotions and conduct 07/08/2017   Seizure (HCC) 03/24/2016   Substance induced mood disorder (HCC) 03/05/2016   Opioid use disorder, severe, dependence (HCC) 02/25/2016   Cocaine use disorder, moderate, dependence (HCC) 02/25/2016   Tobacco use disorder 02/25/2016   Opioid-induced mood disorder (HCC) 02/25/2016   Cannabis use disorder, moderate, dependence (HCC) 02/25/2016   PCP:  Magda Paganini,  MD Pharmacy:   [facility] [address] [place] - [address] [place] [address] Phone: [phone] Fax: [phone]  [id number] - [place], [place] - [address] [place] [address] Phone: [phone] Fax: [phone]  [facility] [address] [place] [address] Phone: [phone] Fax: [phone]  [facility] [address] [place] [address] Phone: [phone] Fax: [phone]  [facility] - [place], [place] -  [address] [place] [address] Phone: [phone] Fax: [phone]     Social Determinants of Health (SDOH) Interventions    Readmission Risk Interventions     No data to display

## 2022-02-26 NOTE — Discharge Summary (Signed)
Physician Discharge Summary  Nicole Chandler [id number] DOB: [birth year] DOA: 02/24/2022  PCP: Carolynne Edouard, MD  Admit date: 02/24/2022 Discharge date: 02/26/2022  Admitted From: Home Discharge disposition: Home  Recommendations at discharge:  Stop substance abuse   Brief narrative: Nicole Chandler is a 61 y.o. female with PMH significant for seizure disorder, bipolar disorder, anxiety/depression, polysubstance abuse, osteoarthritis. Patient presented to the ED on 7/3 with complaint of acute onset of generalized weakness, altered mental status with confusion and seizure-like episode.  The patient was fairly weak for the past couple of weeks and has been having intermittent nausea and vomiting that she felt could be related to worsening reflux.  She was fairly repeated and during the interview was fairly restless, agitated and having brief visual hallucinations.  She has not been taking her medications as often as she should and her Dilantin level was less than 2.5.  She felt weak earlier during the day, shaky and out of it per her boyfriend who was with her.  She stated that her legs were wobbly and were going to give out.  She felt like a seizure was coming.  She admits to recent worsening stress and has been having polydipsia and diminished urine output.   In the ED, patient had a temperature of 98.3, heart rate was elevated to 106, blood pressure 106/65, O2 sat 95% on room air. Labs showed sodium level low at 134, creatinine elevated 1.32, hemoglobin 11.8, WBC 8.8, ferritin level was low at less than 2.5. Urinalysis showed clear yellow urine with small amount of hemoglobin, negative nitrite, negative leukocytes or bacteria. Urine drug screen positive for cannabinoids and tricyclic. Chest x-ray did not show any acute cardiopulmonary abnormality CT head did not show any acute intracranial normality. In the ED, patient was given IV fluid, loading dose of IV Dilantin and IV Ativan After  consultation with neurology patient was switched from Dilantin to oral Keppra. Kept in observation to hospitalist service for further evaluation management.  Subjective: Patient was seen and examined this morning.  Sitting up in chair.  Not in distress.  No new symptoms. No chest pain.  Mental status improved. Friend at bedside. Patient feels super excited to go home.  Assessment and plan: Altered mental status -Presented with altered mental status, confusion.  In the ED she was noted to be in delirium also with visual hallucinations.   -Probably related to drug use, and mental stress.  UDS positive for cannabinoid and tricyclic -CT head unremarkable.  MRI brain unremarkable.   History of seizure seizure  -Noncompliant to Dilantin at home.  Level low.   -Per neurology recommendation, she was switched from Dilantin to Galva.  However patient tells me that she had adverse effects with Keppra and would like to resume Dilantin.  I switched her back to Dilantin per her request. -EEG unremarkable.  Continue Dilantin as before.  Recommend compliance to Dilantin.  Generalized weakness -Improved.  Able to ambulate by self.   Polysubstance abuse  -Including tobacco, cocaine, marijuana, opioid.  UDS positive for cannabinoid and tricyclic. -Counseled to quit   Chronic bronchitis  -Continue inhalers.  Counseled to stop smoking   Dyslipidemia -continue statin therapy.   Major depressive disorder -Psychiatry started on BuSpar.  Prescription given.   Wounds:  -    Discharge Exam:   Vitals:   02/25/22 1639 02/25/22 2119 02/26/22 0632 02/26/22 0731  BP: 125/78 135/88 (!) 144/89 140/83  Pulse: 64 68 64 (!) 58  Resp:  18 18 18 16   Temp: 98.6 F (37 C) 97.6 F (36.4 C) 98 F (36.7 C) 98.2 F (36.8 C)  TempSrc:  Oral Oral   SpO2: 98% 97% 96% 100%  Weight:      Height:        Body mass index is 23.79 kg/m.   General exam: Pleasant, middle-aged Caucasian female.  Not in  physical distress Skin: No rashes, lesions or ulcers. HEENT: Atraumatic, normocephalic, no obvious bleeding Lungs: Clear to auscultation bilaterally CVS: Regular rate and rhythm, no murmur GI/Abd soft, nontender, nondistended, bowels are present CNS: Alert, awake, oriented x3 Psychiatry: Mood appropriate Extremities: No pedal edema, no calf tenderness  Follow ups:    Follow-up Information     [facility] EMERGENCY DEPARTMENT .   Specialty: Emergency Medicine Why: As needed Contact information: [place] [id number] [place] [phone]        PCP .   Contact information: In 5-7 days        [facility] Follow up.   Contact information: [place] [phone]                Discharge Instructions:   Discharge Instructions     Call MD for:  difficulty breathing, headache or visual disturbances   Complete by: As directed    Call MD for:  extreme fatigue   Complete by: As directed    Call MD for:  hives   Complete by: As directed    Call MD for:  persistant dizziness or light-headedness   Complete by: As directed    Call MD for:  persistant nausea and vomiting   Complete by: As directed    Call MD for:  severe uncontrolled pain   Complete by: As directed    Call MD for:  temperature >100.4   Complete by: As directed    Diet general   Complete by: As directed    Discharge instructions   Complete by: As directed    General discharge instructions: Follow with Primary MD Pcp, No in 7 days  Please request your PCP  to go over your hospital tests, procedures, radiology results at the follow up. Please get your medicines reviewed and adjusted.  Your PCP may decide to repeat certain labs or tests as needed. Do not drive, operate heavy machinery, perform activities at heights, swimming or participation in water  activities or provide baby sitting services if your were admitted for syncope or siezures until you have seen by Primary MD or a Neurologist and advised to do so again. [place] Controlled Substance Reporting System database was reviewed. Do not drive, operate heavy machinery, perform activities at heights, swim, participate in water activities or provide baby-sitting services while on medications for pain, sleep and mood until your outpatient physician has reevaluated you and advised to do so again.  You are strongly recommended to comply with the dose, frequency and duration of prescribed medications. Activity: As tolerated with Full fall precautions use walker/cane & assistance as needed Avoid using any recreational substances like cigarette, tobacco, alcohol, or non-prescribed drug. If you experience worsening of your admission symptoms, develop shortness of breath, life threatening emergency, suicidal or homicidal thoughts you must seek medical attention immediately by calling 911 or calling your MD immediately  if symptoms less severe. You must read complete instructions/literature along with all the possible adverse reactions/side effects for all  the medicines you take and that have been prescribed to you. Take any new medicine only after you have completely understood and accepted all the possible adverse reactions/side effects.  Wear Seat belts while driving. You were cared for by a hospitalist during your hospital stay. If you have any questions about your discharge medications or the care you received while you were in the hospital after you are discharged, you can call the unit and ask to speak with the hospitalist or the covering physician. Once you are discharged, your primary care physician will handle any further medical issues. Please note that NO REFILLS for any discharge medications will be authorized once you are discharged, as it is imperative that you return to your primary care  physician (or establish a relationship with a primary care physician if you do not have one).   Increase activity slowly   Complete by: As directed        Discharge Medications:   Allergies as of 02/26/2022       Reactions   Cyclobenzaprine    "makes the pt jumpy"   Naproxen Sodium    Intestinal bloating that was so bad she had to go to the ER        Medication List     STOP taking these medications    escitalopram 10 MG tablet Commonly known as: LEXAPRO   gabapentin 300 MG capsule Commonly known as: NEURONTIN   lidocaine 5 % Commonly known as: Lidoderm   mirtazapine 15 MG tablet Commonly known as: Remeron   nicotine 21 mg/24hr patch Commonly known as: NICODERM CQ - dosed in mg/24 hours       TAKE these medications    albuterol 108 (90 Base) MCG/ACT inhaler Commonly known as: VENTOLIN HFA Inhale 2 puffs into the lungs every 6 (six) hours as needed for wheezing or shortness of breath. What changed:  how much to take when to take this reasons to take this   atorvastatin 20 MG tablet Commonly known as: LIPITOR Take 1 tablet (20 mg total) by mouth daily. What changed:  how much to take when to take this   budesonide-formoterol 80-4.5 MCG/ACT inhaler Commonly known as: Symbicort Inhale 2 puffs into the lungs 2 (two) times daily. What changed:  how much to take when to take this   busPIRone 5 MG tablet Commonly known as: BUSPAR Take 1 tablet (5 mg total) by mouth 2 (two) times daily.   phenytoin 100 MG ER capsule Commonly known as: Dilantin Take 1 capsule (100 mg total) by mouth 3 (three) times daily.         The results of significant diagnostics from this hospitalization (including imaging, microbiology, ancillary and laboratory) are listed below for reference.    Procedures and Diagnostic Studies:   EEG adult  Result Date: 02/25/2022 Charlsie Quest, MD     02/25/2022  3:01 PM Patient Name: JALAYNE GANESH MRN: [id number] Epilepsy  Attending: Charlsie Quest Referring Physician/Provider: Hannah Beat, MD Date: 02/25/2022 Duration: 27.17 mins Patient history:  61 y.o. Caucasian female with medical history significant for seizure disorder, and anxiety C and depression, polysubstance abuse, bipolar 1 disorder and osteoarthritis, who presented to the emergency room with acute onset of generalized weakness, altered mental status with confusion and seizure-like episode. EEG to evaluate for seizure Level of alertness: Awake, asleep AEDs during EEG study: LEV, GBP Technical aspects: This EEG study was done with scalp electrodes positioned according to the 10-20 International system of  electrode placement. Electrical activity was acquired at a sampling rate of 500Hz  and reviewed with a high frequency filter of 70Hz  and a low frequency filter of 1Hz . EEG data were recorded continuously and digitally stored. Description: The posterior dominant rhythm consists of 8-9 Hz activity of moderate voltage (25-35 uV) seen predominantly in posterior head regions, symmetric and reactive to eye opening and eye closing. Sleep was characterized by vertex waves, sleep spindles (12 to 14 Hz), maximal frontocentral region. Hyperventilation and photic stimulation were not performed.   IMPRESSION: This study is within normal limits. No seizures or epileptiform discharges were seen throughout the recording.   MR BRAIN WO CONTRAST  Result Date: 02/25/2022 CLINICAL DATA:  Altered mental status EXAM: MRI HEAD WITHOUT CONTRAST TECHNIQUE: Multiplanar, multiecho pulse sequences of the brain and surrounding structures were obtained without intravenous contrast. COMPARISON:  None Available. FINDINGS: Brain: No acute infarct, mass effect or extra-axial collection. No acute or chronic hemorrhage. Normal white matter signal, parenchymal volume and CSF spaces. The midline structures are normal. Vascular: Major flow voids are preserved. Skull and upper cervical spine:  Normal calvarium and skull base. Visualized upper cervical spine and soft tissues are normal. Sinuses/Orbits:No paranasal sinus fluid levels or advanced mucosal thickening. No mastoid or middle ear effusion. Normal orbits. IMPRESSION: Normal brain MRI. Electronically Signed   By: M.D.   On: 02/25/2022 00:11   CT HEAD WO CONTRAST (04/28/2022)  Result Date: 02/24/2022 CLINICAL DATA:  Headache EXAM: CT HEAD WITHOUT CONTRAST TECHNIQUE: Contiguous axial images were obtained from the base of the skull through the vertex without intravenous contrast. RADIATION DOSE REDUCTION: This exam was performed according to the departmental dose-optimization program which includes automated exposure control, adjustment of the mA and/or kV according to patient size and/or use of iterative reconstruction technique. COMPARISON:  None Available. FINDINGS: Brain: No acute intracranial hemorrhage, mass effect, or herniation. No extra-axial fluid collections. No evidence of acute territorial infarct. No hydrocephalus. Vascular: No hyperdense vessel or unexpected calcification. Skull: Normal. Negative for fracture or focal lesion. Sinuses/Orbits: No acute finding. Other: None. IMPRESSION: No acute intracranial process identified. Electronically Signed   By: 04/28/2022 M.D.   On: 02/24/2022 16:24   DG Chest 2 View  Result Date: 02/24/2022 CLINICAL DATA:  Weakness EXAM: CHEST - 2 VIEW COMPARISON:  Radiograph 08/31/2018 FINDINGS: Unchanged cardiomediastinal silhouette. There is no focal airspace consolidation. There is no pleural effusion. No pneumothorax. No acute osseous abnormality. Thoracic spondylosis. Chronic right posterior sixth rib injury. IMPRESSION: No evidence of acute cardiopulmonary disease. Electronically Signed   By: 04/27/2022 M.D.   On: 02/24/2022 15:52     Labs:   Basic Metabolic Panel: Recent Labs  Lab 02/24/22 1217 02/25/22 0800  NA 134* 139  K 3.7 4.3  CL 105 110  CO2 21* 25  GLUCOSE 130*  80  BUN 14 9  CREATININE 1.32* 0.88  CALCIUM 9.1 8.3*   GFR Estimated Creatinine Clearance: 53.8 mL/min (by C-G formula based on SCr of 0.88 mg/dL). Liver Function Tests: No results for input(s): "AST", "ALT", "ALKPHOS", "BILITOT", "PROT", "ALBUMIN" in the last 168 hours. No results for input(s): "LIPASE", "AMYLASE" in the last 168 hours. No results for input(s): "AMMONIA" in the last 168 hours. Coagulation profile No results for input(s): "INR", "PROTIME" in the last 168 hours.  CBC: Recent Labs  Lab 02/24/22 1217 02/25/22 0800  WBC 8.8 6.9  HGB 11.8* 11.0*  HCT 36.8 34.2*  MCV 100.0 98.6  PLT 272 242   Cardiac Enzymes: No results for input(s): "CKTOTAL", "CKMB", "CKMBINDEX", "TROPONINI" in the last 168 hours. BNP: Invalid input(s): "POCBNP" CBG: No results for input(s): "GLUCAP" in the last 168 hours. D-Dimer No results for input(s): "DDIMER" in the last 72 hours. Hgb A1c No results for input(s): "HGBA1C" in the last 72 hours. Lipid Profile No results for input(s): "CHOL", "HDL", "LDLCALC", "TRIG", "CHOLHDL", "LDLDIRECT" in the last 72 hours. Thyroid function studies No results for input(s): "TSH", "T4TOTAL", "T3FREE", "THYROIDAB" in the last 72 hours.  Invalid input(s): "FREET3" Anemia work up No results for input(s): "VITAMINB12", "FOLATE", "FERRITIN", "TIBC", "IRON", "RETICCTPCT" in the last 72 hours. Microbiology No results found for this or any previous visit (from the past 240 hour(s)).  Time coordinating discharge: 35 minutes  Signed: Conall Vangorder  [facility] 02/26/2022, 2:20 PM

## 2022-11-25 DIAGNOSIS — R222 Localized swelling, mass and lump, trunk: Secondary | ICD-10-CM | POA: Diagnosis not present

## 2023-01-25 ENCOUNTER — Emergency Department
Admission: EM | Admit: 2023-01-25 | Discharge: 2023-01-25 | Disposition: A | Discharge status: Home / Self Care | Payer: Medicaid Other | Attending: Emergency Medicine | Admitting: Emergency Medicine

## 2023-01-25 ENCOUNTER — Emergency Department: Payer: Medicaid Other

## 2023-01-25 ENCOUNTER — Other Ambulatory Visit: Payer: Self-pay

## 2023-01-25 DIAGNOSIS — S52611A Displaced fracture of right ulna styloid process, initial encounter for closed fracture: Secondary | ICD-10-CM | POA: Diagnosis not present

## 2023-01-25 DIAGNOSIS — M25531 Pain in right wrist: Secondary | ICD-10-CM | POA: Diagnosis present

## 2023-01-25 DIAGNOSIS — S52501A Unspecified fracture of the lower end of right radius, initial encounter for closed fracture: Secondary | ICD-10-CM | POA: Insufficient documentation

## 2023-01-25 DIAGNOSIS — Y92009 Unspecified place in unspecified non-institutional (private) residence as the place of occurrence of the external cause: Secondary | ICD-10-CM | POA: Insufficient documentation

## 2023-01-25 MED ORDER — OXYCODONE-ACETAMINOPHEN 5-325 MG PO TABS
1.0000 | ORAL_TABLET | Freq: Once | ORAL | Status: AC
  Administered 2023-01-25: 1 via ORAL
  Filled 2023-01-25: qty 1

## 2023-01-25 MED ORDER — HYDROCODONE-ACETAMINOPHEN 5-325 MG PO TABS: 1.0000 | ORAL_TABLET | Freq: Four times a day (QID) | ORAL | 0 refills | Status: AC | PRN

## 2023-01-25 MED ORDER — OXYCODONE HCL 5 MG PO TABS
5.0000 mg | ORAL_TABLET | Freq: Once | ORAL | Status: AC
  Administered 2023-01-25: 5 mg via ORAL
  Filled 2023-01-25: qty 1

## 2023-01-25 NOTE — ED Triage Notes (Signed)
Pt to ed from home via POV for a wrist injury. Pt was on a hover board and fell off. Pt has obvious deformity to right wrist the patient has good PMS in the extremity. Pt denies hitting her head or any other injury.

## 2023-01-25 NOTE — Discharge Instructions (Addendum)
Please call and schedule follow-up appointment with orthopedics.  Return to the emergency department if you have any symptoms of concern and you are unable to be evaluated by orthopedics.  You may apply an ice pack over your splint.  Do not remove the splint.

## 2023-01-25 NOTE — ED Provider Triage Note (Signed)
Emergency Medicine Provider Triage Evaluation Note  Nicole Chandler , a 62 y.o. female  was evaluated in triage.  Pt complains of right wrist pain and swelling after falling off a hover board. Right hand dominant. No pain in elbow. Shoulder is sore, but able to move it. No history of wrist fracture/surgery in the past..  Physical Exam  BP 130/82   Pulse 73   Temp 98 F (36.7 C)   Resp 16   Ht 5\' 2"  (1.575 m)   LMP 12/17/2007 (Approximate)   SpO2 95%   BMI 23.79 kg/m  Gen:   Awake, no distress   Resp:  Normal effort  MSK:   Right wrist deformity.  Other:  Pulse, motor, and sensory function intact.  Medical Decision Making  Medically screening exam initiated at 6:05 PM.  Appropriate orders placed.  TORIAH LIVERPOOL was informed that the remainder of the evaluation will be completed by another provider, this initial triage assessment does not replace that evaluation, and the importance of remaining in the ED until their evaluation is complete.    [name], FNP 01/25/23 [phone]

## 2023-01-25 NOTE — ED Provider Notes (Signed)
[facility] Provider Note    Event Date/Time   First MD Initiated Contact with Patient 01/25/23 2027     (approximate)   History   Wrist Pain (R wrist - deformed)   HPI  DERRICKA FROESE is a 62 y.o. female  with history of opioid use disorder, cocaine use disorder,  and as listed in EMR presents to the emergency department for treatment and evaluation of right wrist pain.  She was playing with her grandson and decided to try riding his hover board.  She fell off and landed on the deck.  She did not strike her head or lose consciousness. Marland Kitchen      Physical Exam   Triage Vital Signs: ED Triage Vitals [01/25/23 1803]  Enc Vitals Group     BP 130/82     Pulse Rate 73     Resp 16     Temp 98 F (36.7 C)     Temp src      SpO2 95 %     Weight      Height 5\' 2"  (1.575 m)     Head Circumference      Peak Flow      Pain Score 10     Pain Loc      Pain Edu?      Excl. in GC?     Most recent vital signs: Vitals:   01/25/23 1803  BP: 130/82  Pulse: 73  Resp: 16  Temp: 98 F (36.7 C)  SpO2: 95%    General: Awake, no distress.  CV:  Good peripheral perfusion.  Resp:  Normal effort.  Abd:  No distention.  Other:  Obvious deformity of right wrist. Motor and sensory function intact. Capillary refill <3   ED Results / Procedures / Treatments   Labs (all labs ordered are listed, but only abnormal results are displayed) Labs Reviewed - No data to display   EKG  Not indicated.   RADIOLOGY  Image and radiology report reviewed and interpreted by me. Radiology report consistent with the same.  Image of the right wrist shows an acute impacted comminuted distal radius fracture and a mildly displaced ulnar styloid fracture.  PROCEDURES:  Critical Care performed: No  Procedures   MEDICATIONS ORDERED IN ED:  Medications  oxyCODONE-acetaminophen (PERCOCET/ROXICET) 5-325 MG per tablet 1 tablet (1 tablet Oral Given 01/25/23 1806)   oxyCODONE (Oxy IR/ROXICODONE) immediate release tablet 5 mg (5 mg Oral Given 01/25/23 2047)     IMPRESSION / MDM / ASSESSMENT AND PLAN / ED COURSE   I have reviewed the triage note.  Differential diagnosis includes, but is not limited to, radius fracture, ulnar fracture, ligament injury  Patient's presentation is most consistent with acute complicated illness / injury requiring diagnostic workup.  62 year old female presenting to the emergency department for treatment and evaluation of right wrist pain after falling off a hover board this afternoon.  See HPI for further details.  On exam, she has a deformity to the right wrist.  She is right-hand dominant.  Imaging obtained while awaiting ER room assignment shows a comminuted and impacted distal radius fracture and displaced ulnar styloid fracture.  Pulse, motor, and sensory function are all normal.  Patient placed in a sugar-tong OCL and sling.  She is to call orthopedics tomorrow morning to schedule an appointment.  She was given a short course of Norco to help with pain.  She does have a history of substance abuse and we  discussed potential for addiction.  Patient states that she has been clean for the past 10 years and she has had pain medication since without becoming addicted.  ER return precautions discussed.      FINAL CLINICAL IMPRESSION(S) / ED DIAGNOSES   Final diagnoses:  Closed fracture of distal end of right radius, unspecified fracture morphology, initial encounter  Closed displaced fracture of styloid process of right ulna, initial encounter     Rx / DC Orders   ED Discharge Orders          Ordered    HYDROcodone-acetaminophen (NORCO/VICODIN) 5-325 MG tablet  Every 6 hours PRN        01/25/23 2117             Note:  This document was prepared using Dragon voice recognition software and may include unintentional dictation errors.   [name], FNP 01/25/23 2120    Merwyn Katos, MD 01/25/23  2240
